# Patient Record
Sex: Female | Born: 1945 | Race: White | Hispanic: No | Marital: Married | Smoking: Former smoker
Health system: Southern US, Community
[De-identification: ages and names within clinical notes are randomized; demographics above are authoritative.]

## PROBLEM LIST (undated history)

## (undated) DIAGNOSIS — R21 Rash and other nonspecific skin eruption: Secondary | ICD-10-CM

## (undated) DIAGNOSIS — R42 Dizziness and giddiness: Secondary | ICD-10-CM

## (undated) DIAGNOSIS — Z950 Presence of cardiac pacemaker: Secondary | ICD-10-CM

## (undated) DIAGNOSIS — R498 Other voice and resonance disorders: Secondary | ICD-10-CM

## (undated) DIAGNOSIS — M1990 Unspecified osteoarthritis, unspecified site: Secondary | ICD-10-CM

## (undated) DIAGNOSIS — M533 Sacrococcygeal disorders, not elsewhere classified: Secondary | ICD-10-CM

## (undated) DIAGNOSIS — F411 Generalized anxiety disorder: Secondary | ICD-10-CM

## (undated) DIAGNOSIS — K219 Gastro-esophageal reflux disease without esophagitis: Secondary | ICD-10-CM

## (undated) DIAGNOSIS — I6350 Cerebral infarction due to unspecified occlusion or stenosis of unspecified cerebral artery: Secondary | ICD-10-CM

## (undated) DIAGNOSIS — I447 Left bundle-branch block, unspecified: Secondary | ICD-10-CM

## (undated) DIAGNOSIS — I4891 Unspecified atrial fibrillation: Secondary | ICD-10-CM

## (undated) DIAGNOSIS — Z8719 Personal history of other diseases of the digestive system: Secondary | ICD-10-CM

## (undated) DIAGNOSIS — R51 Headache: Secondary | ICD-10-CM

## (undated) DIAGNOSIS — J309 Allergic rhinitis, unspecified: Secondary | ICD-10-CM

## (undated) DIAGNOSIS — I00 Rheumatic fever without heart involvement: Secondary | ICD-10-CM

## (undated) DIAGNOSIS — I498 Other specified cardiac arrhythmias: Secondary | ICD-10-CM

## (undated) DIAGNOSIS — M25569 Pain in unspecified knee: Secondary | ICD-10-CM

## (undated) DIAGNOSIS — R011 Cardiac murmur, unspecified: Secondary | ICD-10-CM

## (undated) DIAGNOSIS — Z96659 Presence of unspecified artificial knee joint: Secondary | ICD-10-CM

## (undated) DIAGNOSIS — F329 Major depressive disorder, single episode, unspecified: Secondary | ICD-10-CM

## (undated) DIAGNOSIS — I2699 Other pulmonary embolism without acute cor pulmonale: Secondary | ICD-10-CM

## (undated) DIAGNOSIS — F3289 Other specified depressive episodes: Secondary | ICD-10-CM

## (undated) DIAGNOSIS — I495 Sick sinus syndrome: Secondary | ICD-10-CM

## (undated) DIAGNOSIS — I10 Essential (primary) hypertension: Secondary | ICD-10-CM

## (undated) DIAGNOSIS — IMO0001 Reserved for inherently not codable concepts without codable children: Secondary | ICD-10-CM

## (undated) DIAGNOSIS — J45909 Unspecified asthma, uncomplicated: Secondary | ICD-10-CM

## (undated) DIAGNOSIS — E119 Type 2 diabetes mellitus without complications: Secondary | ICD-10-CM

## (undated) DIAGNOSIS — J40 Bronchitis, not specified as acute or chronic: Secondary | ICD-10-CM

## (undated) DIAGNOSIS — G4733 Obstructive sleep apnea (adult) (pediatric): Secondary | ICD-10-CM

## (undated) HISTORY — DX: Headache: R51

## (undated) HISTORY — DX: Obstructive sleep apnea (adult) (pediatric): G47.33

## (undated) HISTORY — PX: CARDIAC CATHETERIZATION: SHX172

## (undated) HISTORY — DX: Sick sinus syndrome: I49.5

## (undated) HISTORY — PX: ELBOW SURGERY: SHX618

## (undated) HISTORY — PX: ABDOMINAL HYSTERECTOMY: SHX81

## (undated) HISTORY — DX: Other specified depressive episodes: F32.89

## (undated) HISTORY — DX: Other voice and resonance disorders: R49.8

## (undated) HISTORY — DX: Bronchitis, not specified as acute or chronic: J40

## (undated) HISTORY — DX: Pain in unspecified knee: M25.569

## (undated) HISTORY — PX: INSERT / REPLACE / REMOVE PACEMAKER: SUR710

## (undated) HISTORY — PX: NASAL FRACTURE SURGERY: SHX718

## (undated) HISTORY — DX: Other specified cardiac arrhythmias: I49.8

## (undated) HISTORY — DX: Sacrococcygeal disorders, not elsewhere classified: M53.3

## (undated) HISTORY — DX: Reserved for inherently not codable concepts without codable children: IMO0001

## (undated) HISTORY — DX: Essential (primary) hypertension: I10

## (undated) HISTORY — DX: Cerebral infarction due to unspecified occlusion or stenosis of unspecified cerebral artery: I63.50

## (undated) HISTORY — DX: Rash and other nonspecific skin eruption: R21

## (undated) HISTORY — DX: Left bundle-branch block, unspecified: I44.7

## (undated) HISTORY — PX: CHOLECYSTECTOMY: SHX55

## (undated) HISTORY — DX: Allergic rhinitis, unspecified: J30.9

## (undated) HISTORY — DX: Unspecified asthma, uncomplicated: J45.909

## (undated) HISTORY — DX: Generalized anxiety disorder: F41.1

## (undated) HISTORY — DX: Major depressive disorder, single episode, unspecified: F32.9

## (undated) HISTORY — DX: Unspecified atrial fibrillation: I48.91

## (undated) HISTORY — DX: Presence of cardiac pacemaker: Z95.0

## (undated) HISTORY — DX: Other pulmonary embolism without acute cor pulmonale: I26.99

## (undated) HISTORY — PX: TOTAL KNEE ARTHROPLASTY: SHX125

## (undated) HISTORY — DX: Type 2 diabetes mellitus without complications: E11.9

---

## 1972-03-07 HISTORY — PX: VESICOVAGINAL FISTULA CLOSURE W/ TAH: SUR271

## 1980-03-07 HISTORY — PX: TEMPOROMANDIBULAR JOINT ARTHROPLASTY: SUR76

## 1989-03-07 HISTORY — PX: BILATERAL SALPINGOOPHORECTOMY: SHX1223

## 1997-09-03 ENCOUNTER — Ambulatory Visit: Admission: RE | Admit: 1997-09-03 | Discharge: 1997-09-03 | Payer: Self-pay | Admitting: Gynecologic Oncology

## 1997-09-12 ENCOUNTER — Ambulatory Visit (HOSPITAL_COMMUNITY): Admission: RE | Admit: 1997-09-12 | Discharge: 1997-09-12 | Payer: Self-pay | Admitting: Urology

## 1997-11-18 ENCOUNTER — Ambulatory Visit: Admission: RE | Admit: 1997-11-18 | Discharge: 1997-11-18 | Payer: Self-pay | Admitting: Gynecologic Oncology

## 1998-04-30 ENCOUNTER — Encounter: Payer: Self-pay | Admitting: Urology

## 1998-05-01 ENCOUNTER — Ambulatory Visit (HOSPITAL_COMMUNITY): Admission: RE | Admit: 1998-05-01 | Discharge: 1998-05-01 | Payer: Self-pay | Admitting: Urology

## 1998-05-08 ENCOUNTER — Encounter: Admission: RE | Admit: 1998-05-08 | Discharge: 1998-08-06 | Payer: Self-pay | Admitting: Neurology

## 1998-05-22 ENCOUNTER — Ambulatory Visit (HOSPITAL_COMMUNITY): Admission: RE | Admit: 1998-05-22 | Discharge: 1998-05-22 | Payer: Self-pay | Admitting: Gastroenterology

## 1999-01-26 ENCOUNTER — Ambulatory Visit (HOSPITAL_COMMUNITY): Admission: RE | Admit: 1999-01-26 | Discharge: 1999-01-26 | Payer: Self-pay | Admitting: Urology

## 1999-02-13 ENCOUNTER — Ambulatory Visit (HOSPITAL_COMMUNITY): Admission: RE | Admit: 1999-02-13 | Discharge: 1999-02-13 | Payer: Self-pay

## 1999-04-22 ENCOUNTER — Encounter: Payer: Self-pay | Admitting: Family Medicine

## 1999-04-22 ENCOUNTER — Encounter: Admission: RE | Admit: 1999-04-22 | Discharge: 1999-04-22 | Payer: Self-pay | Admitting: Family Medicine

## 1999-05-24 ENCOUNTER — Emergency Department (HOSPITAL_COMMUNITY): Admission: EM | Admit: 1999-05-24 | Discharge: 1999-05-24 | Payer: Self-pay | Admitting: Emergency Medicine

## 1999-05-28 ENCOUNTER — Inpatient Hospital Stay (HOSPITAL_COMMUNITY): Admission: AD | Admit: 1999-05-28 | Discharge: 1999-06-01 | Payer: Self-pay | Admitting: Neurology

## 1999-05-28 ENCOUNTER — Ambulatory Visit (HOSPITAL_COMMUNITY): Admission: RE | Admit: 1999-05-28 | Discharge: 1999-05-28 | Payer: Self-pay | Admitting: Neurology

## 1999-05-28 ENCOUNTER — Encounter: Payer: Self-pay | Admitting: Neurology

## 1999-05-29 ENCOUNTER — Encounter: Payer: Self-pay | Admitting: Neurology

## 1999-05-30 ENCOUNTER — Encounter: Payer: Self-pay | Admitting: Neurology

## 1999-06-23 ENCOUNTER — Ambulatory Visit (HOSPITAL_COMMUNITY): Admission: RE | Admit: 1999-06-23 | Discharge: 1999-06-23 | Payer: Self-pay | Admitting: Neurology

## 1999-06-24 ENCOUNTER — Encounter: Payer: Self-pay | Admitting: Neurology

## 1999-08-27 ENCOUNTER — Encounter: Payer: Self-pay | Admitting: Neurology

## 1999-08-27 ENCOUNTER — Ambulatory Visit (HOSPITAL_COMMUNITY): Admission: RE | Admit: 1999-08-27 | Discharge: 1999-08-27 | Payer: Self-pay | Admitting: Neurology

## 1999-11-12 ENCOUNTER — Ambulatory Visit (HOSPITAL_COMMUNITY): Admission: RE | Admit: 1999-11-12 | Discharge: 1999-11-12 | Payer: Self-pay | Admitting: Neurology

## 1999-11-12 ENCOUNTER — Encounter: Payer: Self-pay | Admitting: Neurology

## 1999-12-09 ENCOUNTER — Encounter: Payer: Self-pay | Admitting: Urology

## 1999-12-10 ENCOUNTER — Ambulatory Visit (HOSPITAL_COMMUNITY): Admission: RE | Admit: 1999-12-10 | Discharge: 1999-12-10 | Payer: Self-pay | Admitting: Urology

## 1999-12-16 ENCOUNTER — Ambulatory Visit (HOSPITAL_COMMUNITY): Admission: RE | Admit: 1999-12-16 | Discharge: 1999-12-16 | Payer: Self-pay | Admitting: Gastroenterology

## 2000-01-05 ENCOUNTER — Encounter: Admission: RE | Admit: 2000-01-05 | Discharge: 2000-04-04 | Payer: Self-pay | Admitting: Family Medicine

## 2000-02-03 ENCOUNTER — Encounter: Admission: RE | Admit: 2000-02-03 | Discharge: 2000-02-03 | Payer: Self-pay | Admitting: Gastroenterology

## 2000-02-03 ENCOUNTER — Encounter: Payer: Self-pay | Admitting: Gastroenterology

## 2000-03-21 ENCOUNTER — Encounter: Admission: RE | Admit: 2000-03-21 | Discharge: 2000-03-21 | Payer: Self-pay | Admitting: Gastroenterology

## 2000-03-21 ENCOUNTER — Encounter: Payer: Self-pay | Admitting: Gastroenterology

## 2000-04-23 ENCOUNTER — Emergency Department (HOSPITAL_COMMUNITY): Admission: EM | Admit: 2000-04-23 | Discharge: 2000-04-23 | Payer: Self-pay | Admitting: Emergency Medicine

## 2000-04-24 ENCOUNTER — Encounter: Payer: Self-pay | Admitting: Emergency Medicine

## 2000-05-05 ENCOUNTER — Ambulatory Visit (HOSPITAL_COMMUNITY): Admission: RE | Admit: 2000-05-05 | Discharge: 2000-05-05 | Payer: Self-pay | Admitting: Urology

## 2000-05-09 ENCOUNTER — Encounter: Admission: RE | Admit: 2000-05-09 | Discharge: 2000-05-09 | Payer: Self-pay | Admitting: Family Medicine

## 2000-05-09 ENCOUNTER — Encounter: Payer: Self-pay | Admitting: Family Medicine

## 2000-05-14 ENCOUNTER — Ambulatory Visit (HOSPITAL_COMMUNITY): Admission: RE | Admit: 2000-05-14 | Discharge: 2000-05-14 | Payer: Self-pay

## 2000-06-19 ENCOUNTER — Ambulatory Visit (HOSPITAL_BASED_OUTPATIENT_CLINIC_OR_DEPARTMENT_OTHER): Admission: RE | Admit: 2000-06-19 | Discharge: 2000-06-19 | Payer: Self-pay | Admitting: *Deleted

## 2000-07-18 ENCOUNTER — Encounter: Admission: RE | Admit: 2000-07-18 | Discharge: 2000-07-18 | Payer: Self-pay | Admitting: Gastroenterology

## 2000-07-18 ENCOUNTER — Encounter: Payer: Self-pay | Admitting: Gastroenterology

## 2000-08-28 ENCOUNTER — Encounter: Admission: RE | Admit: 2000-08-28 | Discharge: 2000-09-22 | Payer: Self-pay | Admitting: Orthopaedic Surgery

## 2000-09-21 ENCOUNTER — Other Ambulatory Visit: Admission: RE | Admit: 2000-09-21 | Discharge: 2000-09-21 | Payer: Self-pay | Admitting: Obstetrics and Gynecology

## 2000-10-26 ENCOUNTER — Encounter: Admission: RE | Admit: 2000-10-26 | Discharge: 2000-11-15 | Payer: Self-pay | Admitting: Orthopaedic Surgery

## 2000-10-26 ENCOUNTER — Encounter (INDEPENDENT_AMBULATORY_CARE_PROVIDER_SITE_OTHER): Payer: Self-pay

## 2000-10-30 ENCOUNTER — Ambulatory Visit (HOSPITAL_COMMUNITY): Admission: RE | Admit: 2000-10-30 | Discharge: 2000-10-30 | Payer: Self-pay | Admitting: General Surgery

## 2000-10-30 ENCOUNTER — Encounter (HOSPITAL_BASED_OUTPATIENT_CLINIC_OR_DEPARTMENT_OTHER): Payer: Self-pay | Admitting: General Surgery

## 2000-11-24 ENCOUNTER — Encounter (HOSPITAL_BASED_OUTPATIENT_CLINIC_OR_DEPARTMENT_OTHER): Payer: Self-pay | Admitting: General Surgery

## 2000-11-29 ENCOUNTER — Inpatient Hospital Stay (HOSPITAL_COMMUNITY): Admission: RE | Admit: 2000-11-29 | Discharge: 2000-12-04 | Payer: Self-pay | Admitting: General Surgery

## 2001-04-03 ENCOUNTER — Ambulatory Visit (HOSPITAL_BASED_OUTPATIENT_CLINIC_OR_DEPARTMENT_OTHER): Admission: RE | Admit: 2001-04-03 | Discharge: 2001-04-03 | Payer: Self-pay | Admitting: Neurology

## 2001-05-14 ENCOUNTER — Encounter: Admission: RE | Admit: 2001-05-14 | Discharge: 2001-05-14 | Payer: Self-pay | Admitting: Family Medicine

## 2001-05-14 ENCOUNTER — Encounter: Payer: Self-pay | Admitting: Family Medicine

## 2001-05-31 ENCOUNTER — Encounter: Payer: Self-pay | Admitting: Internal Medicine

## 2001-05-31 ENCOUNTER — Ambulatory Visit (HOSPITAL_COMMUNITY): Admission: RE | Admit: 2001-05-31 | Discharge: 2001-05-31 | Payer: Self-pay | Admitting: Internal Medicine

## 2001-09-10 ENCOUNTER — Encounter: Payer: Self-pay | Admitting: Urology

## 2001-09-10 ENCOUNTER — Encounter: Admission: RE | Admit: 2001-09-10 | Discharge: 2001-09-10 | Payer: Self-pay | Admitting: Urology

## 2001-11-15 ENCOUNTER — Encounter: Payer: Self-pay | Admitting: Gastroenterology

## 2001-11-15 ENCOUNTER — Encounter: Admission: RE | Admit: 2001-11-15 | Discharge: 2001-11-15 | Payer: Self-pay | Admitting: Gastroenterology

## 2001-12-06 ENCOUNTER — Ambulatory Visit (HOSPITAL_COMMUNITY): Admission: RE | Admit: 2001-12-06 | Discharge: 2001-12-06 | Payer: Self-pay | Admitting: Gastroenterology

## 2001-12-28 ENCOUNTER — Encounter: Admission: RE | Admit: 2001-12-28 | Discharge: 2001-12-28 | Payer: Self-pay | Admitting: Urology

## 2001-12-28 ENCOUNTER — Encounter: Payer: Self-pay | Admitting: Urology

## 2002-01-01 ENCOUNTER — Ambulatory Visit (HOSPITAL_BASED_OUTPATIENT_CLINIC_OR_DEPARTMENT_OTHER): Admission: RE | Admit: 2002-01-01 | Discharge: 2002-01-01 | Payer: Self-pay | Admitting: Urology

## 2002-03-13 ENCOUNTER — Ambulatory Visit (HOSPITAL_COMMUNITY): Admission: RE | Admit: 2002-03-13 | Discharge: 2002-03-13 | Payer: Self-pay | Admitting: Neurology

## 2002-03-13 ENCOUNTER — Encounter: Payer: Self-pay | Admitting: Neurology

## 2002-03-19 ENCOUNTER — Ambulatory Visit (HOSPITAL_COMMUNITY): Admission: RE | Admit: 2002-03-19 | Discharge: 2002-03-20 | Payer: Self-pay | Admitting: Neurology

## 2002-06-12 ENCOUNTER — Encounter: Payer: Self-pay | Admitting: Family Medicine

## 2002-06-12 ENCOUNTER — Encounter: Admission: RE | Admit: 2002-06-12 | Discharge: 2002-06-12 | Payer: Self-pay | Admitting: Family Medicine

## 2002-07-11 ENCOUNTER — Encounter: Payer: Self-pay | Admitting: Emergency Medicine

## 2002-07-11 ENCOUNTER — Emergency Department (HOSPITAL_COMMUNITY): Admission: EM | Admit: 2002-07-11 | Discharge: 2002-07-11 | Payer: Self-pay | Admitting: Emergency Medicine

## 2002-08-13 ENCOUNTER — Encounter: Admission: RE | Admit: 2002-08-13 | Discharge: 2002-08-20 | Payer: Self-pay

## 2002-08-30 ENCOUNTER — Ambulatory Visit (HOSPITAL_BASED_OUTPATIENT_CLINIC_OR_DEPARTMENT_OTHER): Admission: RE | Admit: 2002-08-30 | Discharge: 2002-08-30 | Payer: Self-pay | Admitting: Urology

## 2003-03-20 ENCOUNTER — Ambulatory Visit (HOSPITAL_COMMUNITY): Admission: RE | Admit: 2003-03-20 | Discharge: 2003-03-20 | Payer: Self-pay | Admitting: Internal Medicine

## 2003-03-25 ENCOUNTER — Inpatient Hospital Stay (HOSPITAL_BASED_OUTPATIENT_CLINIC_OR_DEPARTMENT_OTHER): Admission: RE | Admit: 2003-03-25 | Discharge: 2003-03-25 | Payer: Self-pay | Admitting: Interventional Cardiology

## 2003-06-24 ENCOUNTER — Encounter: Admission: RE | Admit: 2003-06-24 | Discharge: 2003-06-24 | Payer: Self-pay | Admitting: Family Medicine

## 2003-06-24 ENCOUNTER — Encounter: Admission: RE | Admit: 2003-06-24 | Discharge: 2003-06-24 | Payer: Self-pay | Admitting: Otolaryngology

## 2003-08-22 ENCOUNTER — Ambulatory Visit (HOSPITAL_COMMUNITY): Admission: RE | Admit: 2003-08-22 | Discharge: 2003-08-22 | Payer: Self-pay | Admitting: Urology

## 2003-08-22 ENCOUNTER — Ambulatory Visit (HOSPITAL_BASED_OUTPATIENT_CLINIC_OR_DEPARTMENT_OTHER): Admission: RE | Admit: 2003-08-22 | Discharge: 2003-08-22 | Payer: Self-pay | Admitting: Urology

## 2004-04-01 ENCOUNTER — Ambulatory Visit: Payer: Self-pay | Admitting: Internal Medicine

## 2004-04-08 ENCOUNTER — Ambulatory Visit: Payer: Self-pay | Admitting: Internal Medicine

## 2004-04-08 ENCOUNTER — Ambulatory Visit (HOSPITAL_BASED_OUTPATIENT_CLINIC_OR_DEPARTMENT_OTHER): Admission: RE | Admit: 2004-04-08 | Discharge: 2004-04-08 | Payer: Self-pay | Admitting: Internal Medicine

## 2004-04-21 ENCOUNTER — Ambulatory Visit: Payer: Self-pay | Admitting: Internal Medicine

## 2004-04-22 ENCOUNTER — Ambulatory Visit: Payer: Self-pay | Admitting: Internal Medicine

## 2004-04-23 ENCOUNTER — Ambulatory Visit: Payer: Self-pay | Admitting: Internal Medicine

## 2004-05-18 ENCOUNTER — Ambulatory Visit: Payer: Self-pay | Admitting: Internal Medicine

## 2004-06-01 ENCOUNTER — Ambulatory Visit (HOSPITAL_COMMUNITY): Admission: RE | Admit: 2004-06-01 | Discharge: 2004-06-01 | Payer: Self-pay | Admitting: Urology

## 2004-06-01 ENCOUNTER — Ambulatory Visit (HOSPITAL_BASED_OUTPATIENT_CLINIC_OR_DEPARTMENT_OTHER): Admission: RE | Admit: 2004-06-01 | Discharge: 2004-06-01 | Payer: Self-pay | Admitting: Urology

## 2004-06-11 ENCOUNTER — Ambulatory Visit: Payer: Self-pay | Admitting: Internal Medicine

## 2004-06-16 ENCOUNTER — Ambulatory Visit: Payer: Self-pay | Admitting: Internal Medicine

## 2004-06-30 ENCOUNTER — Ambulatory Visit: Payer: Self-pay | Admitting: Internal Medicine

## 2004-07-15 ENCOUNTER — Encounter: Admission: RE | Admit: 2004-07-15 | Discharge: 2004-07-15 | Payer: Self-pay | Admitting: Gastroenterology

## 2004-07-20 ENCOUNTER — Emergency Department (HOSPITAL_COMMUNITY): Admission: EM | Admit: 2004-07-20 | Discharge: 2004-07-20 | Payer: Self-pay | Admitting: Emergency Medicine

## 2004-07-30 ENCOUNTER — Encounter: Admission: RE | Admit: 2004-07-30 | Discharge: 2004-07-30 | Payer: Self-pay | Admitting: Gastroenterology

## 2004-08-16 ENCOUNTER — Ambulatory Visit: Payer: Self-pay | Admitting: Internal Medicine

## 2004-09-01 ENCOUNTER — Encounter: Admission: RE | Admit: 2004-09-01 | Discharge: 2004-09-01 | Payer: Self-pay | Admitting: Family Medicine

## 2004-09-09 ENCOUNTER — Encounter: Admission: RE | Admit: 2004-09-09 | Discharge: 2004-09-09 | Payer: Self-pay | Admitting: Family Medicine

## 2004-09-21 ENCOUNTER — Ambulatory Visit: Payer: Self-pay | Admitting: Internal Medicine

## 2004-11-23 ENCOUNTER — Ambulatory Visit: Payer: Self-pay | Admitting: Internal Medicine

## 2005-01-11 ENCOUNTER — Ambulatory Visit: Payer: Self-pay | Admitting: Internal Medicine

## 2005-01-21 ENCOUNTER — Encounter: Admission: RE | Admit: 2005-01-21 | Discharge: 2005-01-21 | Payer: Self-pay | Admitting: Family Medicine

## 2005-02-01 ENCOUNTER — Ambulatory Visit (HOSPITAL_BASED_OUTPATIENT_CLINIC_OR_DEPARTMENT_OTHER): Admission: RE | Admit: 2005-02-01 | Discharge: 2005-02-01 | Payer: Self-pay | Admitting: Urology

## 2005-02-01 ENCOUNTER — Ambulatory Visit (HOSPITAL_COMMUNITY): Admission: RE | Admit: 2005-02-01 | Discharge: 2005-02-01 | Payer: Self-pay | Admitting: Urology

## 2005-03-03 ENCOUNTER — Ambulatory Visit: Payer: Self-pay | Admitting: Internal Medicine

## 2005-05-17 ENCOUNTER — Ambulatory Visit (HOSPITAL_BASED_OUTPATIENT_CLINIC_OR_DEPARTMENT_OTHER): Admission: RE | Admit: 2005-05-17 | Discharge: 2005-05-17 | Payer: Self-pay | Admitting: Internal Medicine

## 2005-05-29 ENCOUNTER — Ambulatory Visit: Payer: Self-pay | Admitting: Internal Medicine

## 2005-05-31 ENCOUNTER — Ambulatory Visit: Payer: Self-pay | Admitting: Internal Medicine

## 2005-06-14 ENCOUNTER — Ambulatory Visit: Payer: Self-pay | Admitting: Internal Medicine

## 2005-09-06 ENCOUNTER — Encounter: Admission: RE | Admit: 2005-09-06 | Discharge: 2005-09-06 | Payer: Self-pay | Admitting: Family Medicine

## 2005-09-20 ENCOUNTER — Ambulatory Visit: Payer: Self-pay | Admitting: Internal Medicine

## 2005-09-29 ENCOUNTER — Ambulatory Visit: Payer: Self-pay | Admitting: Internal Medicine

## 2005-12-06 ENCOUNTER — Ambulatory Visit (HOSPITAL_BASED_OUTPATIENT_CLINIC_OR_DEPARTMENT_OTHER): Admission: RE | Admit: 2005-12-06 | Discharge: 2005-12-06 | Payer: Self-pay | Admitting: Urology

## 2006-01-05 ENCOUNTER — Ambulatory Visit: Payer: Self-pay | Admitting: Internal Medicine

## 2006-01-30 ENCOUNTER — Ambulatory Visit: Payer: Self-pay | Admitting: Internal Medicine

## 2006-04-25 ENCOUNTER — Ambulatory Visit: Payer: Self-pay | Admitting: Internal Medicine

## 2006-05-02 ENCOUNTER — Ambulatory Visit: Payer: Self-pay | Admitting: Internal Medicine

## 2006-05-22 ENCOUNTER — Ambulatory Visit: Payer: Self-pay | Admitting: Internal Medicine

## 2006-05-24 ENCOUNTER — Encounter: Admission: RE | Admit: 2006-05-24 | Discharge: 2006-05-24 | Payer: Self-pay | Admitting: Orthopedic Surgery

## 2006-06-16 ENCOUNTER — Encounter: Admission: RE | Admit: 2006-06-16 | Discharge: 2006-06-16 | Payer: Self-pay | Admitting: Orthopedic Surgery

## 2006-06-30 ENCOUNTER — Encounter: Admission: RE | Admit: 2006-06-30 | Discharge: 2006-06-30 | Payer: Self-pay | Admitting: Orthopedic Surgery

## 2006-07-17 ENCOUNTER — Ambulatory Visit: Payer: Self-pay | Admitting: Internal Medicine

## 2006-07-20 ENCOUNTER — Ambulatory Visit: Payer: Self-pay | Admitting: Internal Medicine

## 2006-08-09 ENCOUNTER — Ambulatory Visit: Payer: Self-pay | Admitting: Internal Medicine

## 2006-08-31 ENCOUNTER — Ambulatory Visit (HOSPITAL_COMMUNITY): Admission: RE | Admit: 2006-08-31 | Discharge: 2006-08-31 | Payer: Self-pay | Admitting: Neurological Surgery

## 2006-09-11 ENCOUNTER — Ambulatory Visit: Payer: Self-pay | Admitting: Internal Medicine

## 2006-10-04 ENCOUNTER — Encounter: Admission: RE | Admit: 2006-10-04 | Discharge: 2006-10-04 | Payer: Self-pay | Admitting: Family Medicine

## 2006-10-22 ENCOUNTER — Observation Stay (HOSPITAL_COMMUNITY): Admission: EM | Admit: 2006-10-22 | Discharge: 2006-10-23 | Payer: Self-pay | Admitting: Emergency Medicine

## 2006-12-12 ENCOUNTER — Ambulatory Visit: Payer: Self-pay | Admitting: Internal Medicine

## 2006-12-14 ENCOUNTER — Ambulatory Visit (HOSPITAL_BASED_OUTPATIENT_CLINIC_OR_DEPARTMENT_OTHER): Admission: RE | Admit: 2006-12-14 | Discharge: 2006-12-14 | Payer: Self-pay | Admitting: Orthopedic Surgery

## 2007-01-15 ENCOUNTER — Ambulatory Visit: Payer: Self-pay | Admitting: Internal Medicine

## 2007-01-17 ENCOUNTER — Inpatient Hospital Stay (HOSPITAL_COMMUNITY): Admission: EM | Admit: 2007-01-17 | Discharge: 2007-01-19 | Payer: Self-pay | Admitting: Emergency Medicine

## 2007-01-17 ENCOUNTER — Ambulatory Visit: Payer: Self-pay | Admitting: Cardiology

## 2007-02-12 ENCOUNTER — Telehealth (INDEPENDENT_AMBULATORY_CARE_PROVIDER_SITE_OTHER): Payer: Self-pay | Admitting: *Deleted

## 2007-02-27 ENCOUNTER — Ambulatory Visit (HOSPITAL_COMMUNITY): Admission: RE | Admit: 2007-02-27 | Discharge: 2007-02-27 | Payer: Self-pay | Admitting: Urology

## 2007-04-05 ENCOUNTER — Ambulatory Visit (HOSPITAL_BASED_OUTPATIENT_CLINIC_OR_DEPARTMENT_OTHER): Admission: RE | Admit: 2007-04-05 | Discharge: 2007-04-05 | Payer: Self-pay | Admitting: Orthopedic Surgery

## 2007-04-06 ENCOUNTER — Ambulatory Visit: Payer: Self-pay | Admitting: Internal Medicine

## 2007-05-16 DIAGNOSIS — F411 Generalized anxiety disorder: Secondary | ICD-10-CM | POA: Insufficient documentation

## 2007-05-16 DIAGNOSIS — R519 Headache, unspecified: Secondary | ICD-10-CM | POA: Insufficient documentation

## 2007-05-16 DIAGNOSIS — R498 Other voice and resonance disorders: Secondary | ICD-10-CM

## 2007-05-16 DIAGNOSIS — Z8673 Personal history of transient ischemic attack (TIA), and cerebral infarction without residual deficits: Secondary | ICD-10-CM | POA: Insufficient documentation

## 2007-05-16 DIAGNOSIS — R51 Headache: Secondary | ICD-10-CM | POA: Insufficient documentation

## 2007-05-16 DIAGNOSIS — E119 Type 2 diabetes mellitus without complications: Secondary | ICD-10-CM | POA: Insufficient documentation

## 2007-05-16 DIAGNOSIS — J40 Bronchitis, not specified as acute or chronic: Secondary | ICD-10-CM | POA: Insufficient documentation

## 2007-05-16 DIAGNOSIS — IMO0001 Reserved for inherently not codable concepts without codable children: Secondary | ICD-10-CM

## 2007-05-16 DIAGNOSIS — G4733 Obstructive sleep apnea (adult) (pediatric): Secondary | ICD-10-CM | POA: Insufficient documentation

## 2007-05-16 DIAGNOSIS — R1319 Other dysphagia: Secondary | ICD-10-CM | POA: Insufficient documentation

## 2007-05-22 ENCOUNTER — Encounter: Payer: Self-pay | Admitting: Internal Medicine

## 2007-06-06 ENCOUNTER — Ambulatory Visit: Payer: Self-pay | Admitting: Internal Medicine

## 2007-06-06 DIAGNOSIS — Z950 Presence of cardiac pacemaker: Secondary | ICD-10-CM | POA: Insufficient documentation

## 2007-06-09 DIAGNOSIS — J45998 Other asthma: Secondary | ICD-10-CM

## 2007-06-21 ENCOUNTER — Encounter: Admission: RE | Admit: 2007-06-21 | Discharge: 2007-06-21 | Payer: Self-pay | Admitting: Orthopedic Surgery

## 2007-07-04 ENCOUNTER — Encounter: Admission: RE | Admit: 2007-07-04 | Discharge: 2007-07-04 | Payer: Self-pay | Admitting: Orthopedic Surgery

## 2007-07-26 ENCOUNTER — Ambulatory Visit: Payer: Self-pay | Admitting: Internal Medicine

## 2007-07-31 ENCOUNTER — Encounter: Admission: RE | Admit: 2007-07-31 | Discharge: 2007-07-31 | Payer: Self-pay | Admitting: Orthopedic Surgery

## 2007-08-08 ENCOUNTER — Ambulatory Visit (HOSPITAL_COMMUNITY): Admission: RE | Admit: 2007-08-08 | Discharge: 2007-08-08 | Payer: Self-pay | Admitting: Neurological Surgery

## 2007-08-10 ENCOUNTER — Encounter: Payer: Self-pay | Admitting: Sports Medicine

## 2007-08-28 ENCOUNTER — Ambulatory Visit: Payer: Self-pay | Admitting: Sports Medicine

## 2007-08-28 ENCOUNTER — Encounter: Admission: RE | Admit: 2007-08-28 | Discharge: 2007-08-28 | Payer: Self-pay | Admitting: Sports Medicine

## 2007-08-28 DIAGNOSIS — M2550 Pain in unspecified joint: Secondary | ICD-10-CM

## 2007-08-28 DIAGNOSIS — M25569 Pain in unspecified knee: Secondary | ICD-10-CM

## 2007-08-28 DIAGNOSIS — F329 Major depressive disorder, single episode, unspecified: Secondary | ICD-10-CM

## 2007-09-03 ENCOUNTER — Telehealth: Payer: Self-pay | Admitting: Sports Medicine

## 2007-09-28 ENCOUNTER — Ambulatory Visit: Payer: Self-pay | Admitting: Sports Medicine

## 2007-09-28 DIAGNOSIS — M533 Sacrococcygeal disorders, not elsewhere classified: Secondary | ICD-10-CM | POA: Insufficient documentation

## 2007-10-29 ENCOUNTER — Encounter: Admission: RE | Admit: 2007-10-29 | Discharge: 2007-10-29 | Payer: Self-pay | Admitting: Family Medicine

## 2007-10-30 ENCOUNTER — Ambulatory Visit: Payer: Self-pay | Admitting: Sports Medicine

## 2007-10-30 ENCOUNTER — Encounter: Payer: Self-pay | Admitting: Family Medicine

## 2007-11-06 ENCOUNTER — Encounter: Payer: Self-pay | Admitting: Sports Medicine

## 2007-11-15 ENCOUNTER — Ambulatory Visit: Payer: Self-pay | Admitting: Internal Medicine

## 2007-11-27 ENCOUNTER — Encounter: Admission: RE | Admit: 2007-11-27 | Discharge: 2007-11-27 | Payer: Self-pay | Admitting: Orthopedic Surgery

## 2007-12-20 ENCOUNTER — Ambulatory Visit: Payer: Self-pay | Admitting: Internal Medicine

## 2007-12-20 DIAGNOSIS — J309 Allergic rhinitis, unspecified: Secondary | ICD-10-CM | POA: Insufficient documentation

## 2007-12-21 ENCOUNTER — Telehealth (INDEPENDENT_AMBULATORY_CARE_PROVIDER_SITE_OTHER): Payer: Self-pay | Admitting: *Deleted

## 2008-02-08 ENCOUNTER — Encounter: Payer: Self-pay | Admitting: Internal Medicine

## 2008-03-06 ENCOUNTER — Ambulatory Visit: Payer: Self-pay | Admitting: Internal Medicine

## 2008-03-07 HISTORY — PX: OTHER SURGICAL HISTORY: SHX169

## 2008-04-23 ENCOUNTER — Telehealth: Payer: Self-pay | Admitting: Internal Medicine

## 2008-05-19 ENCOUNTER — Telehealth (INDEPENDENT_AMBULATORY_CARE_PROVIDER_SITE_OTHER): Payer: Self-pay | Admitting: *Deleted

## 2008-05-26 ENCOUNTER — Telehealth (INDEPENDENT_AMBULATORY_CARE_PROVIDER_SITE_OTHER): Payer: Self-pay | Admitting: *Deleted

## 2008-05-26 ENCOUNTER — Ambulatory Visit: Payer: Self-pay | Admitting: Internal Medicine

## 2008-05-30 ENCOUNTER — Telehealth (INDEPENDENT_AMBULATORY_CARE_PROVIDER_SITE_OTHER): Payer: Self-pay | Admitting: *Deleted

## 2008-06-16 ENCOUNTER — Ambulatory Visit: Payer: Self-pay | Admitting: Internal Medicine

## 2008-10-14 ENCOUNTER — Ambulatory Visit: Payer: Self-pay | Admitting: Internal Medicine

## 2008-12-22 ENCOUNTER — Ambulatory Visit: Payer: Self-pay | Admitting: Internal Medicine

## 2008-12-22 DIAGNOSIS — I2699 Other pulmonary embolism without acute cor pulmonale: Secondary | ICD-10-CM | POA: Insufficient documentation

## 2009-01-22 ENCOUNTER — Ambulatory Visit: Payer: Self-pay | Admitting: Internal Medicine

## 2009-02-06 ENCOUNTER — Encounter: Admission: RE | Admit: 2009-02-06 | Discharge: 2009-02-06 | Payer: Self-pay | Admitting: Family Medicine

## 2009-04-29 ENCOUNTER — Ambulatory Visit: Payer: Self-pay | Admitting: Internal Medicine

## 2009-06-11 ENCOUNTER — Encounter: Payer: Self-pay | Admitting: Internal Medicine

## 2009-06-22 ENCOUNTER — Ambulatory Visit: Payer: Self-pay | Admitting: Internal Medicine

## 2009-06-22 ENCOUNTER — Telehealth (INDEPENDENT_AMBULATORY_CARE_PROVIDER_SITE_OTHER): Payer: Self-pay | Admitting: *Deleted

## 2009-08-06 ENCOUNTER — Ambulatory Visit: Payer: Self-pay | Admitting: Internal Medicine

## 2009-08-31 ENCOUNTER — Telehealth (INDEPENDENT_AMBULATORY_CARE_PROVIDER_SITE_OTHER): Payer: Self-pay | Admitting: *Deleted

## 2009-11-24 ENCOUNTER — Ambulatory Visit: Payer: Self-pay | Admitting: Internal Medicine

## 2009-12-21 ENCOUNTER — Ambulatory Visit: Payer: Self-pay | Admitting: Internal Medicine

## 2009-12-23 ENCOUNTER — Encounter: Payer: Self-pay | Admitting: Internal Medicine

## 2010-01-19 ENCOUNTER — Encounter: Admission: RE | Admit: 2010-01-19 | Discharge: 2010-01-19 | Payer: Self-pay | Admitting: Family Medicine

## 2010-02-08 ENCOUNTER — Encounter: Admission: RE | Admit: 2010-02-08 | Discharge: 2010-02-08 | Payer: Self-pay | Admitting: Family Medicine

## 2010-02-16 ENCOUNTER — Encounter
Admission: RE | Admit: 2010-02-16 | Discharge: 2010-02-16 | Payer: Self-pay | Source: Home / Self Care | Attending: Family Medicine | Admitting: Family Medicine

## 2010-03-24 ENCOUNTER — Ambulatory Visit: Payer: Self-pay | Admitting: Internal Medicine

## 2010-03-28 ENCOUNTER — Encounter: Payer: Self-pay | Admitting: Family Medicine

## 2010-03-31 ENCOUNTER — Ambulatory Visit
Admission: RE | Admit: 2010-03-31 | Discharge: 2010-03-31 | Payer: Self-pay | Source: Home / Self Care | Attending: Orthopedic Surgery | Admitting: Orthopedic Surgery

## 2010-03-31 LAB — BASIC METABOLIC PANEL
CO2: 29 mEq/L (ref 19–32)
Calcium: 9.9 mg/dL (ref 8.4–10.5)
Creatinine, Ser: 0.92 mg/dL (ref 0.4–1.2)
Glucose, Bld: 366 mg/dL — ABNORMAL HIGH (ref 70–99)

## 2010-03-31 LAB — GLUCOSE, CAPILLARY: Glucose-Capillary: 114 mg/dL — ABNORMAL HIGH (ref 70–99)

## 2010-04-06 NOTE — Assessment & Plan Note (Signed)
Summary: ROV 6 MONTHS///KP   Primary Provider/Referring Provider:  L. Lupe Carney  CC:  6 month follow up visit-OSA, asthma, and allergies. Unable to use CPAP at this time due to teeth implants and doing work on them..  History of Present Illness: December 22, 2008- Rhinoconjunctivitis, asthma, OSA, hx DVT Had DVT right leg after fx/ knee replacement, with PE, on coumadin now. More repair sgy planned. 1 yr rov - hoarseness in am's- has questoins about Serevent diskus - only uses Symbicort a couple times a yr. We had a long discussion of rescue vs maintenance, and LABA meds.  June 22, 2009- Rhinoconjunctivitis, asthma, OSA, hx DVT Had repair of previous right knee surgery, without respiratory complications. Seasonal rhinitis is worse currently despite daily benadryl and her allergy vaccine.  Can't wear CPAP- gum is tender after recent gingival surgery. This is short term. Grits teeth less without it and can't wear bite guard til gingiva heals. Mentions sore in ear x 3-4 months.  December 21, 2009- Rhinoconjunctivitis, asthma, OSA, hx DVT NurseCC: 6 month follow up visit-OSA, asthma,allergies. Unable to use CPAP at this time due to teeth implants and doing work on them. Wearing bite guard constantly to stop bruxism and mouth biting. Dr Clovis Riley had weaned her down to lower doses of Xanax and that's when the biting started. He has put her back on 3 daily. Not wearing CPAP. Tired during daytime.  Denies wheeze, cough or nasal drainage/ sneeze. Allergy vaccine does well and she says she can tell by the end of the week that she is needing it again as she starts to need antihistamines for nasal congestion  and drainage.  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 Mother has had amputation.    Asthma History    Initial Asthma Severity Rating:    Age range: 12+ years    Symptoms: 0-2 days/week    Nighttime Awakenings: 0-2/month    Interferes w/ normal activity: no limitations   SABA use (not for EIB): 0-2 days/week    Asthma Severity Assessment: Intermittent   Preventive Screening-Counseling & Management  Alcohol-Tobacco     Smoking Status: quit     Year Quit: 2003     Pack years: 40 years  1 pack a month  Current Medications (verified): 1)  Bd Tb Syringe 25g X 5/8" 1 Ml  Misc (Tuberculin-Allergy Syringes) .... Use As Directed. 2)  Tenormin 50 Mg  Tabs (Atenolol) .... Take 1 Tablet By Mouth Once A Day 3)  Lipitor 20 Mg  Tabs (Atorvastatin Calcium) .... Take 1 Tablet By Mouth Once A Day 4)  Allergy Vaccine Go 1:10 .... Twice A Week 5)  Cymbalta 60 Mg  Cpep (Duloxetine Hcl) .... Take 1 Capsule By Mouth Once A Day 6)  Cymbalta 30 Mg Cpep (Duloxetine Hcl) .... Take 1 Capsule By Mouth Once A Day 7)  Glucotrol Xl 10 Mg  Tb24 (Glipizide) .... Take 1 Tablet By Mouth Once A Day 8)  Levemir 100 Unit/ml  Soln (Insulin Detemir) .... 30units in The Morning, 47+ in The Evening 9)  Symbicort 160-4.5 Mcg/act  Aero (Budesonide-Formoterol Fumarate) .... Inhale 2 Puffs Two Times A Day and Rinse Mouth After Each Use 10)  Xanax 1 Mg  Tabs (Alprazolam) .... Take 1 Tablet By Mouth Three Times A Day As Needed 11)  Epipen 0.3 Mg/0.27ml (1:1000)  Devi (Epinephrine Hcl (Anaphylaxis)) .... As Directed 12)  Cpap 11 .... Wear At Bedtime 13)  Trazodone Hcl 150 Mg Tabs (Trazodone  Hcl) .... Take 1 Tab By Mouth At Bedtime 14)  Aspirin 81 Mg Tbec (Aspirin) .... Take 1 By Mouth Once Daily 15)  Zegerid 40-1100 Mg Caps (Omeprazole-Sodium Bicarbonate) .... Take 1 Capsule By Mouth At Bedtime 16)  Miralax  Powd (Polyethylene Glycol 3350) .Marland Kitchen.. 17grams in Water Daily As Needed 17)  Fish Oil 1200 Mg Caps (Omega-3 Fatty Acids) .... 2 Capsules By Mouth Once Daily  Allergies (verified): 1)  ! Keflex 2)  ! Lisinopril 3)  ! Cozaar 4)  ! * Cromic Sutures 5)  ! Doxycycline 6)  ! Metformin Hcl 7)  ! Levaquin 8)  ! Neurontin 9)  ! Entex 10)  ! Zocor 11)  ! * Januvia 12)  ! * Avandia 13)  ! *  Lyrica 14)  ! * Bellaryl 15)  ! 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  married  Risk Factors: Smoking Status: quit (12/21/2009)  Past Surgical History: Right total knee replacement 2002 Repair Right total knee 2010 Hysterectomy 1974 Bilat salpingo-oopherectomy 1991 Cholecystectomy Nasal fracture surg  Review of Systems      See HPI  The patient denies anorexia, fever, weight loss, weight gain, vision loss, decreased hearing, hoarseness, chest pain, syncope, dyspnea on exertion, peripheral edema, prolonged cough, headaches, hemoptysis, abdominal pain, and severe indigestion/heartburn.    Vital Signs:  Patient profile:   65 year old female Height:      63 inches Weight:      145.13 pounds BMI:     25.80 O2 Sat:      97 % on Room air Pulse rate:   65 / minute BP sitting:   124 / 76  (left arm) Cuff size:   regular  Vitals Entered By: Reynaldo Minium CMA (December 21, 2009 10:54 AM)  O2 Flow:  Room air CC: 6 month follow up visit-OSA, asthma,allergies. Unable to use CPAP at this time due to teeth implants and doing work on them.   Physical Exam  Additional Exam:  General: A/Ox3; pleasant and cooperative, NAD, calm,  SKIN: no rash, lesions NODES: no lymphadenopathy HEENT: [place]/AT, EOM- WNL,  Conjuctivae- clear, PERRLA, TM-WNL- good light reflex. Nose- clear, Throat- clear and wnl, chronic nasal speech quality Seems to have some difficulty opening mouth widely.  NECK: Supple w/ fair ROM, JVD- none, normal carotid impulses w/o bruits Thyroid- normal to palpation CHEST: Clear to P&A,  HEART: RRR, no m/g/r heard ABDOMEN: soft ZOX:WRUE, nl pulses, no edema  NEURO: Grossly intact to observation      Impression & Recommendations:  Problem # 1:  DEPRESSION (ICD-311) She seems quite depressed today by her situational isues. I suggested she ask Dr Clovis Riley about meds other than Xanax if he is concerned about habituation. Perhaps tranxene would help sleep quality and her mood.  Problem # 2:  ASTHMA (ICD-493.90) Good control now. We discussed relation of mood to dyspnea and to asthma with issue of vocal cord dysfunction.   Problem # 3:  OBSTRUCTIVE SLEEP APNEA (ICD-327.23)  She is not able to use her CPAP and I don't  know when that will change. She wont be a candidatefor surgery or an oral appliance. She should stay off the flat of her back and keep weight down.  Other Orders: Est. Patient Level IV (19147)  Patient Instructions: 1)  Please schedule a follow-up appointment in 1 year. 2)  Flu vax 3)  Maybe Dr Clovis Riley would have another approach to suggest, rather than the Xanax.  4)  cc Dr Clovis Riley

## 2010-04-06 NOTE — Assessment & Plan Note (Signed)
Summary: 6 months/apc   Primary Provider/Referring Provider:  L. Lupe Carney  CC:  6 month follow up visit.  History of Present Illness: 12/20/07- 1 yr f/u. Continues allergy vaccine at 1:10, discussed, no problems.Got flu vax. Asks antiallergy eye drop- discussed [place].  05/26/08- Rhinoconjunctivitis, asthma, OSA  3 weeks head and chest congestion, dry throat without fever. Finished a zpak from 3/15. Says right side of head and chest now broken up after coughing and blowing yellow green, but left head and chest still feel congested. No longer productive. left ear still aches. Using Neti pot. Using cpap less- afraid of getting choked by postnasal drip.  December 22, 2008- Rhinoconjunctivitis, asthma, OSA, hx DVT Had DVT right leg after fx/ knee replacement, with PE, on coumadin now. More repair sgy planned. 1 yr rov - hoarseness in am's- has questoins about Serevent diskus - only uses Symbicort a couple times a yr. We had a long discussion of rescue vs maintenance, and LABA meds.  June 22, 2009- Rhinoconjunctivitis, asthma, OSA, hx DVT Had repair of previous right knee surgery, without respiratory complications. Seasonal rhinitis is worse currently despite daily benadryl and her allergy vaccine.  Can't wear CPAP- gum is tender after recent gingival surgery. This is short term. Grits teeth less without it and can't wear bite guard til gingiva heals. Mentions sore in ear x 3-4 months.    Current Medications (verified): 1)  Bd Tb Syringe 25g X 5/8" 1 Ml  Misc (Tuberculin-Allergy Syringes) .... Use As Directed. 2)  Tenormin 50 Mg  Tabs (Atenolol) .... Take 1 Tablet By Mouth Once A Day 3)  Lipitor 20 Mg  Tabs (Atorvastatin Calcium) .... Take 1 Tablet By Mouth Once A Day 4)  Multivitamins   Tabs (Multiple Vitamin) .... Take 1 Tablet By Mouth Once A Day 5)  Allergy Vaccine Go 1:10 6)  Cymbalta 60 Mg  Cpep (Duloxetine Hcl) 7)  Glucotrol Xl 10 Mg  Tb24 (Glipizide) 8)  Levemir 100 Unit/ml   Soln (Insulin Detemir) .Marland Kitchen.. 13 Units 9)  Symbicort 160-4.5 Mcg/act  Aero (Budesonide-Formoterol Fumarate) .... Inhale 2 Puffs Two Times A Day and Rinse Mouth After Each Use 10)  Xanax 1 Mg  Tabs (Alprazolam) .... Take One Tablet By Mouth Two  Times A Day As Needed 11)  Epipen 0.3 Mg/0.27ml (1:1000)  Devi (Epinephrine Hcl (Anaphylaxis)) .... As Directed 12)  Vicodin 5-500 Mg  Tabs (Hydrocodone-Acetaminophen) .... Take 1 Tab By Mouth Q6 Hours As Needed For Pain 13)  Cpap .Marland Kitchen.. 11 14)  Trazodone Hcl 100 Mg Tabs (Trazodone Hcl) .... Take 1 By Mouth At Bedtime 15)  Iron 325 (65 Fe) Mg Tabs (Ferrous Sulfate) .... Take One By Mouth Once Daily 16)  Vitamin C 500 Mg Tabs (Ascorbic Acid) .... Take One By Mouth Once Daily 17)  Diphenhydramine Hcl 25 Mg Caps (Diphenhydramine Hcl) .... Take 1 By Mouth Once Daily 18)  Aspirin 81 Mg Tbec (Aspirin) .... Take 1 By Mouth Once Daily  Allergies (verified): 1)  ! Keflex 2)  ! Lisinopril 3)  ! Cozaar 4)  ! * Cromic Sutures 5)  ! Doxycycline  Past History:  Past Medical History: Last updated: 12/22/2008 SACROILIAC JOINT DYSFUNCTION (ICD-724.6) DEPRESSION (ICD-311) DEPRESSIVE DISORDER NOT ELSEWHERE CLASSIFIED (ICD-311) PAIN IN JOINT OTHER SPECIFIED SITES (ICD-719.48) KNEE PAIN, RIGHT (ICD-719.46) ASTHMA (ICD-493.90) CARDIAC PACEMAKER IN SITU (ICD-V45.01) OTHER DYSPHAGIA (ICD-787.29) OBSTRUCTIVE SLEEP APNEA (ICD-327.23) ANXIETY (ICD-300.00) FACIAL PAIN (ICD-784.0) CVA (ICD-434.91) FIBROMYALGIA (ICD-729.1) DIABETES MELLITUS (ICD-250.00) DYSPHONIA (ICD-784.49) BRONCHITIS (ICD-490) RHINOCONJUNCTIVITIS, ALLERGIC (  ICD-477.9) Pulmonary Embolism  Social History: Last updated: 05/26/2008 Patient states former smoker.  married  Risk Factors: Smoking Status: quit (12/20/2007)  Past Surgical History: Right total knee replacement 2002 Repair Right total knee 2011 Hysterectomy 1974 Bilat salpingo-oopherectomy 1991 Cholecystectomy Nasal fracture  surg  Review of Systems      See HPI  The patient denies anorexia, fever, weight loss, weight gain, vision loss, decreased hearing, hoarseness, chest pain, syncope, dyspnea on exertion, peripheral edema, prolonged cough, headaches, hemoptysis, abdominal pain, and severe indigestion/heartburn.    Vital Signs:  Patient profile:   65 year old female Height:      63 inches Weight:      145.13 pounds BMI:     25.80 O2 Sat:      97 % on Room air Pulse rate:   68 / minute BP sitting:   138 / 78  (left arm) Cuff size:   regular  Vitals Entered By: Reynaldo Minium CMA (June 22, 2009 10:14 AM)  O2 Flow:  Room air  Physical Exam  Additional Exam:  General: A/Ox3; pleasant and cooperative, NAD, calm,  SKIN: no rash, lesions NODES: no lymphadenopathy HEENT: [place]/AT, EOM- WNL, Conjuctivae- clear, PERRLA, TM-WNL- good light reflex. There is a minor excoriation in left auricle at 4 o'clock. Nose- clear, Throat- clear and wnl, chronic nasal speech quality NECK: Supple w/ fair ROM, JVD- none, normal carotid impulses w/o bruits Thyroid- normal to palpation CHEST: Clear to P&A,  HEART: RRR, no m/g/r heard ABDOMEN: soft ZOX:WRUE, nl pulses, no edema  NEURO: Grossly intact to observation      Impression & Recommendations:  Problem # 1:  ASTHMA (ICD-493.90) Good control for the season.  Problem # 2:  RHINOCONJUNCTIVITIS, ALLERGIC (ICD-477.9)  Seasonal exacerbation. Will have her try changing the benadryl to allegra. Sample Omnaris with script. For the minor skin lesion in her ear, she can try topical hydrocorticone for a week. She is due to see Dr Clovis Riley soon . Her updated medication list for this problem includes:    Diphenhydramine Hcl 25 Mg Caps (Diphenhydramine hcl) .Marland Kitchen... Take 1 by mouth once daily    Omnaris 50 Mcg/act Susp (Ciclesonide) .Marland Kitchen... 1- 2 sprays each nostril every night at  bedtime.  Problem # 3:  OBSTRUCTIVE SLEEP APNEA (ICD-327.23) Temporary inability to wear CPAP should  resolve as gum surgery heals.  Medications Added to Medication List This Visit: 1)  Diphenhydramine Hcl 25 Mg Caps (Diphenhydramine hcl) .... Take 1 by mouth once daily 2)  Aspirin 81 Mg Tbec (Aspirin) .... Take 1 by mouth once daily 3)  Omnaris 50 Mcg/act Susp (Ciclesonide) .Marland Kitchen.. 1- 2 sprays each nostril every night at  bedtime.  Other Orders: Est. Patient Level III (45409)  Patient Instructions: 1)  Please schedule a follow-up appointment in 6 months. 2)  Sample/ script Omnaris nasal spray: 1-2 puffs each nostril, every night at bedtime. 3)  Try otc Allegra 60 mg, twice daily if needed, as an alternative to benadryl. Prescriptions: OMNARIS 50 MCG/ACT SUSP (CICLESONIDE) 1- 2 sprays each nostril every night at  bedtime.  #1 x prn   Entered and Authorized by:   Waymon Budge MD   Signed by:   Waymon Budge MD on 06/22/2009   Method used:   Print then Give to Patient   RxID:   [id number]   Appended Document: meds, allergies update Medications Added * ALLERGY VACCINE GO 1:10 twice a week CYMBALTA 60 MG  CPEP (DULOXETINE HCL) Take 1 capsule  by mouth once a day CYMBALTA 30 MG CPEP (DULOXETINE HCL) Take 1 capsule by mouth once a day GLUCOTROL XL 10 MG  TB24 (GLIPIZIDE) Take 1 tablet by mouth once a day LEVEMIR 100 UNIT/ML  SOLN (INSULIN DETEMIR) 30units in the morning, 47+ in the evening XANAX 1 MG  TABS (ALPRAZOLAM) Take 1 tablet by mouth three times a day as needed * CPAP 11 wear at bedtime TRAZODONE HCL 150 MG TABS (TRAZODONE HCL) Take 1 tab by mouth at bedtime ZEGERID 40-1100 MG CAPS (OMEPRAZOLE-SODIUM BICARBONATE) Take 1 capsule by mouth at bedtime MIRALAX  POWD (POLYETHYLENE GLYCOL 3350) 17grams in water daily as needed FISH OIL 1200 MG CAPS (OMEGA-3 FATTY ACIDS) 2 capsules by mouth once daily          Clinical Lists Changes  Medications: Changed medication from GLUCOTROL XL 10 MG  TB24 (GLIPIZIDE) to GLUCOTROL XL 10 MG  TB24 (GLIPIZIDE) Take 1 tablet by mouth  once a day Changed medication from LEVEMIR 100 UNIT/ML  SOLN (INSULIN DETEMIR) 13 units to LEVEMIR 100 UNIT/ML  SOLN (INSULIN DETEMIR) 30units in the morning, 47+ in the evening Added new medication of ZEGERID 40-1100 MG CAPS (OMEPRAZOLE-SODIUM BICARBONATE) Take 1 capsule by mouth at bedtime Added new medication of MIRALAX  POWD (POLYETHYLENE GLYCOL 3350) 17grams in water daily as needed Changed medication from CYMBALTA 60 MG  CPEP (DULOXETINE HCL) to CYMBALTA 60 MG  CPEP (DULOXETINE HCL) Take 1 capsule by mouth once a day Added new medication of CYMBALTA 30 MG CPEP (DULOXETINE HCL) Take 1 capsule by mouth once a day Changed medication from TRAZODONE HCL 100 MG TABS (TRAZODONE HCL) take 1 by mouth at bedtime to TRAZODONE HCL 150 MG TABS (TRAZODONE HCL) Take 1 tab by mouth at bedtime Changed medication from XANAX 1 MG  TABS (ALPRAZOLAM) take one tablet by mouth two  times a day as needed to XANAX 1 MG  TABS (ALPRAZOLAM) Take 1 tablet by mouth three times a day as needed Changed medication from * CPAP 11 to * CPAP 11 wear at bedtime Changed medication from * ALLERGY VACCINE GO 1:10 to * ALLERGY VACCINE GO 1:10 twice a week Added new medication of FISH OIL 1200 MG CAPS (OMEGA-3 FATTY ACIDS) 2 capsules by mouth once daily Removed medication of VICODIN 5-500 MG  TABS (HYDROCODONE-ACETAMINOPHEN) Take 1 tab by mouth Q6 hours as needed for pain Removed medication of MULTIVITAMINS   TABS (MULTIPLE VITAMIN) Take 1 tablet by mouth once a day Removed medication of IRON 325 (65 FE) MG TABS (FERROUS SULFATE) Take one by mouth once daily Removed medication of VITAMIN C 500 MG TABS (ASCORBIC ACID) Take one by mouth once daily Removed medication of DIPHENHYDRAMINE HCL 25 MG CAPS (DIPHENHYDRAMINE HCL) take 1 by mouth once daily Allergies: Added new allergy or adverse reaction of METFORMIN HCL Added new allergy or adverse reaction of LEVAQUIN Added new allergy or adverse reaction of NEURONTIN Added new allergy or  adverse reaction of ENTEX Added new allergy or adverse reaction of ZOCOR Added new allergy or adverse reaction of * JANUVIA Added new allergy or adverse reaction of * AVANDIA Added new allergy or adverse reaction of * LYRICA Added new allergy or adverse reaction of * BELLARYL Added new allergy or adverse reaction of * DESMOPRESSIN ACETATE 0.2MG      updated per med list provided by patient at 06-22-09 ov with Dr. Maple Hudson. Boone Master CNA  June 24, 2009 4:38 PM

## 2010-04-06 NOTE — Letter (Signed)
Summary: CMN for CPAP/[facility]  CMN for CPAP/[facility]   Imported By: Lanelle Bal 06/18/2009 10:07:12  _____________________________________________________________________  External Attachment:    Type:   Image     Comment:   External Document

## 2010-04-06 NOTE — Progress Notes (Signed)
Summary: medication question  Phone Note Call from Patient Call back at Home Phone [phone]   Caller: Patient Call For: young Summary of Call: In office today was told to take allegra 60mg  pharmacy doesn't have it they have allegra 180mg  pls advise. Initial call taken by: Darletta Moll,  June 22, 2009 2:18 PM  Follow-up for Phone Call        Dr.Young, is it okay for pt to take 180 mg allegra incread of 60 mg. Please advise, thanks  Follow-up by: Vernie Murders,  June 22, 2009 3:48 PM  Additional Follow-up for Phone Call Additional follow up Details #1::        Pt was told to get the OTC brand of Allegra; which ever is avaliable will be fine. Reynaldo Minium CMA  June 22, 2009 4:33 PM     Additional Follow-up for Phone Call Additional follow up Details #2::    Spoke with pt and advised per Dr Maple Hudson, okay to get the allegra 180 otc. Follow-up by: Vernie Murders,  June 22, 2009 4:43 PM

## 2010-04-06 NOTE — Progress Notes (Signed)
Summary: sinus congestion  Phone Note Call from Patient Call back at Home Phone [phone]   Caller: Patient Call For: young Summary of Call: pt states that she has taken allegra (per cy's recs) since she saw cy in april. this hasn't helped her at all per pt. she had her allergy shot yesterday and still has sinus congestion. pls advise.  Initial call taken by: Tivis Ringer, CNA,  August 31, 2009 12:53 PM  Follow-up for Phone Call        Spoke with pt. Pt states she tried Careers adviser as recomended by CY for allergies but states this is not helping.  Requesting CY's recs.  Gweneth Dimitri RN  August 31, 2009 1:58 PM   Additional Follow-up for Phone Call Additional follow up Details #1::        If she is mostly stopped up, try otc decongestant  Sudafed-PE.  Can she also try a neti pot for saline rinse?  Otherwise, what does she think might help? Additional Follow-up by: Waymon Budge MD,  August 31, 2009 5:01 PM    Additional Follow-up for Phone Call Additional follow up Details #2::    Spoke with pt.  Her complaint is nasal congestion so I reccomended that she try otc sudafed PE.  Pt states already has neti pot and has been using this. I advised that she call back if this is not helping. Follow-up by: Vernie Murders,  August 31, 2009 5:09 PM

## 2010-04-09 NOTE — Letter (Signed)
Summary: DME/[facility]  DME/[facility]   Imported By: Lester Cutler 12/29/2009 09:17:43  _____________________________________________________________________  External Attachment:    Type:   Image     Comment:   External Document

## 2010-04-26 NOTE — Op Note (Signed)
NAMEDONIS, [name]                 ACCOUNT NO.:  [id number]  MEDICAL RECORD NO.:  [id number]          PATIENT TYPE:  AMB  LOCATION:  DSC                          FACILITY:  [facility]  PHYSICIAN:  Cindee Salt, M.D.       DATE OF BIRTH:  05-26-1945  DATE OF PROCEDURE:  03/31/2010 DATE OF DISCHARGE:                              OPERATIVE REPORT   PREOPERATIVE DIAGNOSES:  Carpal tunnel syndrome, cubital tunnel syndrome left arm.  POSTOPERATIVE DIAGNOSES:  Carpal tunnel syndrome, cubital tunnel syndrome left arm.  OPERATION:  Decompression median nerve of the wrist, decompression ulnar nerve of the elbow, left side.  SURGEON:  Cindee Salt, MD  ASSISTANT:  Carolyne Fiscal, RN  ANESTHESIA:  General with local infiltration.  ANESTHESIOLOGIST:  Bedelia Person, MD.  HISTORY:  The patient is a 65 year old female with a history of carpal tunnel syndrome.  EMG nerve conduction is positive for cubital tunnel syndrome.  Again, nerve conduction is positive.  She desires decompression following failure of conservative treatment.  Pre, peri, and postoperative course have been discussed along with risks and complications.  She is aware that there is no guarantee with the surgery, possibility of infection, recurrence of injury to arteries, nerves, tendons, incomplete relief of symptoms, dystrophy.  The preoperative area of the patient is seen.  The extremity marked by both the patient and surgeon.  Antibiotic given.  PROCEDURE:  The patient was brought to the operating room where a general anesthetic was carried out without difficulty.  She was prepped using ChloraPrep, supine position with the left arm free.  A 3-minute dry time was allowed.  Time-out and taken confirming the patient procedure.  The carpal tunnel was approached first.  Longitudinal incision made in the palm, carried down through subcutaneous tissue. Bleeders were electrocauterized.  Palmar fascia was split, superficial palmar arch  identified, flexor tendon to the ring and little finger identified.  The ulnar side of the median nerve and carpal retinaculum was incised with sharp dissection, right angle and Sewall retractor were placed between skin and forearm fascia.  The fascia released for approximately a centimeter and half proximal to wrist crease under direct vision.  Canal was explored.  Air compression to the nerve was apparent.  No further lesions were identified.  Wound was irrigated. The skin closed with interrupted 5-0 Vicryl Rapide sutures.  Local infiltration was given with 0.25% Marcaine without epinephrine, approximately 5 mL was used.  A separate incision was then made over the medial epicondyle of the left elbow, carried down through subcutaneous tissue.  Bleeders again electrocauterized with bipolar.  Neurovascular structures identified and protected.  The dissection carried down to [facility] fascia.  This was released down to its most posterior aspect. This allowed visualization of the ulnar nerve.  The subcutaneous tissue was then dissected free from the fascia of the flexor carpi ulnaris.  A fasciotomy performed distally after placement of two knee retractors. The muscle was then split longitudinally.  A KMI carpal tunnel guide was then inserted between the nerve and the deep fascia, and this was released with a pair of ENT scissors.  This  was done approximately 6-7 cm distally.  The proximal nerve was then extended to the subcutaneous tissue, again dissected free from the underlying fascia.  The two knee retractors placed.  This allowed visualization of the proximal fascia. The KMI nerve retractor was placed, and the fascia released to the arcade of structures proximally.  The elbow was then flexed to 90 degrees.  No subluxation to the nerve was apparent.  The wound was copiously irrigated with saline.  Bleeders again electrocauterized with bipolar.  The Osborne fascia was then sutured to the  posterior skin flap with 2-0 Vicryl sutures.  Subcutaneous tissue was closed with interrupted 2-0 Vicryl and the skin with a subcuticular 5-0 Vicryl Rapide suture.  Local infiltration was then given with 0.25% Marcaine without epinephrine.  A sterile compressive dressing and long-arm splint applied with the elbow flexed to approximately 30 degrees.  Deflation of the tourniquet, all fingers immediately pinked.  She was taken to the recovery room for observation in satisfactory condition.  She will be discharged to home to return to the [facility] in 1 week, on [place].          ______________________________ Cindee Salt, M.D.     GK/MEDQ  D:  03/31/2010  T:  04/01/2010  Job:  [id number]  Electronically Signed by Cindee Salt M.D. on 04/26/2010 02:24:06 PM

## 2010-06-15 ENCOUNTER — Other Ambulatory Visit (HOSPITAL_COMMUNITY): Payer: Self-pay | Admitting: Orthopedic Surgery

## 2010-06-15 DIAGNOSIS — M25561 Pain in right knee: Secondary | ICD-10-CM

## 2010-06-15 DIAGNOSIS — T84038A Mechanical loosening of other internal prosthetic joint, initial encounter: Secondary | ICD-10-CM

## 2010-06-15 DIAGNOSIS — Z96659 Presence of unspecified artificial knee joint: Secondary | ICD-10-CM

## 2010-06-25 ENCOUNTER — Encounter (HOSPITAL_COMMUNITY): Payer: Self-pay

## 2010-06-25 ENCOUNTER — Ambulatory Visit (HOSPITAL_COMMUNITY): Admission: RE | Admit: 2010-06-25 | Payer: Self-pay | Source: Ambulatory Visit

## 2010-06-25 ENCOUNTER — Encounter (HOSPITAL_COMMUNITY)
Admission: RE | Admit: 2010-06-25 | Discharge: 2010-06-25 | Disposition: A | Discharge status: Home / Self Care | Payer: Medicare Other | Source: Ambulatory Visit | Attending: Orthopedic Surgery | Admitting: Orthopedic Surgery

## 2010-06-25 DIAGNOSIS — T84038A Mechanical loosening of other internal prosthetic joint, initial encounter: Secondary | ICD-10-CM

## 2010-06-25 DIAGNOSIS — M25569 Pain in unspecified knee: Secondary | ICD-10-CM | POA: Insufficient documentation

## 2010-06-25 DIAGNOSIS — Z96659 Presence of unspecified artificial knee joint: Secondary | ICD-10-CM | POA: Insufficient documentation

## 2010-06-25 DIAGNOSIS — M25561 Pain in right knee: Secondary | ICD-10-CM

## 2010-06-25 HISTORY — DX: Presence of unspecified artificial knee joint: Z96.659

## 2010-06-25 MED ORDER — TECHNETIUM TC 99M MEDRONATE IV KIT
22.7000 | PACK | Freq: Once | INTRAVENOUS | Status: AC | PRN
  Administered 2010-06-25: 22.7 via INTRAVENOUS

## 2010-07-13 ENCOUNTER — Ambulatory Visit (INDEPENDENT_AMBULATORY_CARE_PROVIDER_SITE_OTHER): Payer: Medicare Other

## 2010-07-13 DIAGNOSIS — J309 Allergic rhinitis, unspecified: Secondary | ICD-10-CM

## 2010-07-20 NOTE — Op Note (Signed)
NAMEMANHA, AMATO                 ACCOUNT NO.:  [id number]   MEDICAL RECORD NO.:  [id number]          PATIENT TYPE:  INP   LOCATION:  3742                         FACILITY:  [facility]   PHYSICIAN:  Francisca December, M.D.  DATE OF BIRTH:  03-09-1945   DATE OF PROCEDURE:  01/18/2007  DATE OF DISCHARGE:                               OPERATIVE REPORT   PROCEDURES PERFORMED:  1. Insert dual-chamber permanent transvenous pacemaker.  2. Left subclavian venogram.   SURGEON:  Francisca December, M.D.   INDICATIONS:  [name] is a 65 year old woman was admitted yesterday  evening with profound fatigue and shortness of breath.  She was found to  be in second-degree Mobitz type 2 heart block with a ventricular  response of 40-45 beats per minute.  She is brought to the  catheterization laboratory at this time for insertion of a dual-chamber  permanent transvenous pacemaker.   PROCEDURE NOTE:  The patient was brought to the cardiac catheterization  laboratory in the fasting state.  The left prepectoral region was  prepped and draped in the usual sterile fashion.  Local anesthesia was  obtained with the infiltration of 1% lidocaine with epinephrine.  A left  subclavian venogram was then performed with a peripheral injection of 20  mL of Omnipaque.  A digital cine angiogram was obtained and road-  mapped to guide future left subclavian puncture.  The venogram did  demonstrate the vein to be widely patent and coursing in a normal  fashion over the anterior surface of the 1st rib and beneath the middle  third of the clavicle.  There was no evidence for persistence of the  left superior vena cava.   A 6- to 7-cm incision was then made in the deltopectoral groove and this  was carried down by sharp dissection and electrocautery to the  prepectoral fascia.  There, a plane was lifted and a pocket formed  inferiorly and medially with electrocautery and blunt dissection.  Two  left subclavian punctures  were then performed with an 18-gauge thin-wall  needle, through which was passed a 0.038-inch tight J guidewire.  Over  the initial guidewire, a 7-French tearaway sheath and dilator were  advanced.  The dilator and wire were removed and the ventricular lead  was advanced to the level of the right atrium.  The sheath was torn  away.  Using standard technique and fluoroscopic landmarks, the lead was  manipulated onto the interventricular septum; this was an active-  fixation lead and the screw was advanced as appropriate.  It was tested  for diaphragmatic pacing at 10 volts and none was found.  Adequate  pacing parameters were obtained and this is reported below.  The lead  was then sutured into place using 3 separate 0 silk ligatures.  Over the  remaining guidewire, a second 7-French tearaway sheath and dilator were  advanced.  The dilator and wire were removed and the atrial lead was  advanced to the level of the right atrium.  The sheath was then torn  away.  Using standard technique and fluoroscopic landmarks, the  lead was  manipulated into the low right atrial appendage.  There, the lead was  tested for adequate pacing parameters and this is noted below.  It was  also tested for diaphragmatic pacing at 10 volts and none was found.  The lead was then sutured into place using 3 separate 0 silk ligatures.  The pocket was then copiously irrigated using 1% kanamycin solution.  The leads were then attached to the pacing generator, carefully  identifying each by its serial number and placing each into the  appropriate receptacle.  Each lead was tightened into place and tested  for security.  The leads were then wound beneath the pacing generator  and the generator was placed in the pocket.  An 0 silk anchoring suture  was applied.  Hemostasis was obtained by electrocautery and the use of  Surgicel.  The pocket was then closed using 2-0 Vicryl for the  subcutaneous layer in a running fashion.   The skin was approximated  using 4-0 Vicryl in a running subcuticular fashion.  Steri-Strips and a  sterile dressing were applied and the patient was transported to the  recovery area in stable condition in A-sense/V-pace mode.   EQUIPMENT DATA:  The pacing generator is a Medtronic ADAPTA model ADDRL-  1, serial number [id number].  The atrial lead is Medtronic model number  [id number], serial number [id number].  The ventricular lead is a Medtronic  model number [id number], serial number [id number].   PACING DATA:  The right ventricular lead detected a 24.4-mV R wave.  The  pacing threshold was 0.7 volts at 0.5-millisecond pulse width.  The  impedance was 867 ohms, resulting in a current-at-capture threshold of  0.9 mA.  The atrial lead detected a 3.2-mV R wave.  The pacing threshold  was 1.6 volts at 0.5-millisecond pulse width.  The impedance was 778  ohms, resulting in a current-at-capture threshold of 2.7 mA.      Francisca December, M.D.  Electronically Signed     JHE/MEDQ  D:  01/18/2007  T:  01/19/2007  Job:  [id number]   cc:   Lyn Records, M.D.

## 2010-07-20 NOTE — Op Note (Signed)
NAMEANASTON, KOEHN                 ACCOUNT NO.:  [id number]   MEDICAL RECORD NO.:  [id number]          PATIENT TYPE:  AMB   LOCATION:  DAY                          FACILITY:  [facility]   PHYSICIAN:  Jamison Neighbor, M.D.  DATE OF BIRTH:  February 28, 1946   DATE OF PROCEDURE:  02/27/2007  DATE OF DISCHARGE:                               OPERATIVE REPORT   PREOPERATIVE DIAGNOSIS:  Interstitial cystitis.   POSTOPERATIVE DIAGNOSIS:  Interstitial cystitis.   PROCEDURES:  1. Cystoscopy.  2. Urethral calibration.  3. Hydrodistention of the bladder.  4. Marcaine and Pyridium instillation.  5. Marcaine and Kenalog injection.   SURGEON:  Jamison Neighbor, M.D.   ANESTHESIA:  General.   COMPLICATIONS:  None.   DRAINS:  None.   BRIEF HISTORY:  This patient is known to have interstitial cystitis and  she tested not responding to medical therapy,  instillation therapy,  etc. She does, however, respond very well to hydrodistention.  She is  asking for repeat hydrodistention be performed.  She is aware of the  fact there is no guarantee she will obtain a  response  comparable to  that which she has had in the past.  She gave full and informed consent.   PROCEDURE:  After the successful induction of general anesthesia, the  patient was placed in the dorsal lithotomy position, prepped with  Betadine and draped in usual sterile fashion.  Careful bimanual  examination revealed no significant cystocele, rectocele or enterocele.  The urethra was of normal size.   The cystoscope was inserted.  The bladder was carefully inspected.  No  tumors or stones were seen.  Both ureteral orifices were of normal  configuration and location.  The bladder was distended at a pressure of  100 cm water for 5 minutes, when the bladder was drained.  Minimal  glomerulations were seen.  The bladder looked healthy.  There  were no tumors or other irregularities identified.  The patient had  Marcaine and Pyridium left in the  bladder; Marcaine and Kenalog were  injected periurethrally.   The patient tolerated the procedure and was taken to recovery in good  condition.      Jamison Neighbor, M.D.  Electronically Signed     RJE/MEDQ  D:  02/27/2007  T:  02/28/2007  Job:  [id number]

## 2010-07-20 NOTE — H&P (Signed)
NAMECHRISHONDA, HESCH                 ACCOUNT NO.:  [id number]   MEDICAL RECORD NO.:  [id number]          PATIENT TYPE:  INP   LOCATION:  3742                         FACILITY:  [facility]   PHYSICIAN:  Vernice Jefferson, MD          DATE OF BIRTH:  [birth year]   DATE OF ADMISSION:  01/17/2007  DATE OF DISCHARGE:                              HISTORY & PHYSICAL   PRIMARY CARDIOLOGIST:  Dr. Verdis Prime.   CHIEF COMPLAINT:  Chest pain x4 hours.   HISTORY OF PRESENT ILLNESS:  The patient is a 65 year old white female  with a history of hypertension, diabetes and fibromyalgia, who has had a  negative left heart catheterization in 2005 and a negative nuclear  stress test in January 2008, comes in with chest pain that began while  cooking dinner today.  She describes it initially as a fluttering in her  chest, and then progressed to a pressure-like sensation that was  squeezing across her chest.  The patient states that when EMS got there,  she received nitroglycerin, which did not appear to help her pain, only  gave her a headache.  The pain has only been resolved with morphine  while here in the ED.  The patient has never had an MI before in the  past, although she does have a family history.  The patient did not  report any associated symptoms with the shortness of breath, although  she does have some right arm pain that is chronic in nature.   PAST MEDICAL HISTORY:  1. Diabetes.  2. Hypertension.  3. Fibromyalgia.  4. Obstructive sleep apnea.  5. Panic disorder with depression.   SOCIAL HISTORY:  She is married.  No tobacco, no alcohol, no drug use.   FAMILY HISTORY:  Coronary disease, but none early in her family.   MEDICATIONS:  1. Glucotrol-XL.  2. Levemir.  3. Tenormin 50 once a day.  4. Lipitor 20 once a day.  5. Trazodone.  6. Cymbalta.  7. Xanax.  8. Vicodin.  9. Aspirin.   ALLERGIES:  1. COZAAR.  2. KEFLEX.  3. LISINOPRIL.  4. METFORMIN.  5. NEURONTIN.   REVIEW OF  SYSTEMS:  Negative reported review of systems except otherwise  dictated in the above H&P.   PHYSICAL EXAMINATION:  VITAL SIGNS:  Her blood pressure is 146/89, heart  rate of 46, T-max afebrile.  GENERAL:  Well-developed, well-nourished, white female in no acute  distress.  HEENT:  Moist mucous membranes.  No sclerae icterus, conjunctival  pattern.  NECK:  Supple.  Full range of motion.  No jugular venous distention.  CARDIOVASCULAR:  Regular rate and rhythm.  No rubs, murmurs or gallops.  Bradycardic.  CHEST:  Clear to auscultation bilaterally.  No wheezes, rales or  rhonchi.  ABDOMEN:  Nontender, nondistended.  Normoactive bowel sounds.  No  rebound, no guarding.  EXTREMITIES:  No peripheral edema.  Pulses are 2+ bilaterally.  NEURO:  Nonfocal.   A chest x-ray demonstrates no acute infiltrate.  An EKG demonstrates a  normal sinus rhythm with a second-degree AV  block at 2:1 conduction with  an anterior T-wave abnormality.  A prior ECG demonstrated a left bundle  branch block morphology.   LABORATORY DATA:  A white count of 9.2, hemoglobin 13.9, platelets of  234.  Potassium at 3.5, BUN and creatinine are 17 and 0.8.  Cardiac  enzymes are negative x1 set.   IMPRESSION:  1. Acute coronary syndrome, unstable angina.  2. Diabetes mellitus.  3. Hypertension.  4. Obstructive sleep apnea.  5. Fibromyalgia.  6. New second-degree AV block with 2:1 conduction, asymptomatic.   PLAN:  We will admit the patient under telemetry for Dr. Verdis Prime.  Rule out for myocardial infarction by serial enzymes.  Her TIMI score is  3.  We will initiate heparin and aspirin.  Hold beta blocker given her  relative bradycardia rate of 46 with a second-degree AV block.  We will  continue all of her other meds, including her long-acting insulin, as  well as the Glucotrol.  Additionally, we will put her sliding scale  insulin protocol, n.p.o. for risk stratification in the morning to be  determined by  Dr. Katrinka Blazing.  We will continue her CPAP tonight.      Vernice Jefferson, MD  Electronically Signed     JT/MEDQ  D:  01/17/2007  T:  01/18/2007  Job:  [id number]

## 2010-07-20 NOTE — H&P (Signed)
NAMEOLIVIAROSE, PUNCH                 ACCOUNT NO.:  [id number]   MEDICAL RECORD NO.:  [id number]          PATIENT TYPE:  INP   LOCATION:  1823                         FACILITY:  [facility]   PHYSICIAN:  Hollice Espy, M.D.DATE OF BIRTH:  1945/05/02   DATE OF ADMISSION:  10/22/2006  DATE OF DISCHARGE:                              HISTORY & PHYSICAL   PRIMARY CARE PHYSICIAN:  L. Lupe Carney, M.D.   CHIEF COMPLAINT:  Weakness and nausea.   HISTORY OF PRESENT ILLNESS:  The patient is a 65 year old white female  with past medical history of fibromyalgia, interstitial cystitis, and  diabetes mellitus, who has been having problems with bladder spasms as  of late.  She spoke to her urologist, Dr. Logan Bores, who initially called  the patient in some Doxycycline.  The patient tried 2-3 days of  Doxycycline and started having problems with significant nausea.  She  got to the point where she felt like she could barely take any p.o.  She  continued to have problems with cystitis.  She tells me that she was  unable to eat or drink anything and felt worse.  She spoke to Dr. Logan Bores'  partner who  called in a prescription for Bactrim DS.  She tried this  but was only able to take one dose and again she said she felt so  tremendously nauseous that she could not take any more and she came into  the emergency room.  When the patient came into the emergency room she  was noted to have a normal blood pressure, normal BUN and creatinine,  and an albumin of 4.7 which is well within the normal range.  The only  lab of note was a sodium of 120.  There appears to no previous history  of hyponatremia.  The patient continues to complain of feeling very  anxious and very weak.  She felt mildly nauseous.  She tried taking  Phenergan oral p.o. at home, but she said this did not help at all.  She  feels quite fatigued.  She denies any headaches, vision changes,  dysphagia, chest pain, palpitations, shortness of breath,  wheeze, cough.  She complains of lower abdominal pain over the suprapubic area,  especially when she urinates.  No hematuria.  She does complain of some  dysuria.  No constipation or diarrhea.  No focal extremity numbness,  weakness, or pain.  She complains of significant generalized weakness.   REVIEW OF SYSTEMS:  Otherwise negative.   PAST MEDICAL HISTORY:  1. Diabetes mellitus.  2. Fibromyalgia.  3. Depression.  4. Hyperlipidemia.  5. Anxiety.  6. Interstitial cystitis.   MEDICATIONS:  1. She is on Glucotrol 10.  2. Insulin Levemir 25 units subcu daily.  3. Tenormin 50 daily.  4. Lipitor 20 daily.  5. Zegerid 40/1100 daily.  6. Cymbalta 90 daily.  7. Trazodone 100 q.h.s.  8. Xanax 1 mg t.i.d. p.r.n.  9. CPAP nightly.  10.Allergy shot two times a week.  11.Symbicort asthma inhaler p.r.n.  12.Vicodin 5/500 p.r.n.  13.Aspirin daily.   ALLERGIES:  1. She has  reported allergies to [facility].  2. LISINOPRIL.  3. COZAAR.  4. CHROMIC SUTURES.  5. METFORMIN.  6. LEVAQUIN.  7. ACTOS.  8. NEURONTIN.  It is difficult to say which of these are true      allergies and which of these are medication intolerances or      sensitivities.   SOCIAL HISTORY:  She denies any tobacco, alcohol, or drug use.   FAMILY HISTORY:  Noncontributory.   PHYSICAL EXAMINATION:  VITAL SIGNS:  On admission temperature 98.3,  heart rate 76, blood pressure 192/90, respirations 14, O2 saturation 96%  on room air.  GENERAL:  In general the patient is alert and oriented x3.  Some  distress secondary to anxiety.  HEENT:  Normocephalic, atraumatic.  Mucous membranes are moist.  NECK:  She has no carotid bruits.  HEART:  Regular rate and rhythm.  S1/S2.  LUNGS:  Clear to auscultation bilaterally.  ABDOMEN:  Soft, nontender, nondistended.  Positive bowel sounds.  EXTREMITIES:  Show no clubbing, cyanosis, or edema.  She has good  capillary refill.   LABORATORY DATA:  White count 9.8, H&H 14.4 and 42, MCV  86, platelet  count 289, no shift.  Sodium 1230, potassium 2.9, chloride 83, bicarb  27, BUN 7, creatinine 0.6, glucose 157.  LFTs are normal including  albumin of 4.7.  Lipase 24.  UA is completely normal.   ASSESSMENT/PLAN:  1. Hyponatremia.  Difficult to say the etiology of this.  This may      possibly be medication-related.  Will plan to admit the patient on      1500 ml fluid restriction and continue to follow.  2. Nausea.  I feel that likely this is secondary to the patient's      sensitivities to medication and she is not actually dehydrated in      any way.  She has no signs of malnutrition.  Will continue on her      p.o. carb modified diet and      actually would hold off on giving her IV fluids secondary to making      her sodium fall further.  3. Diabetes mellitus.  Continue medications.  4. Anxiety.  Continue Xanax p.r.n.      Hollice Espy, M.D.  Electronically Signed     SKK/MEDQ  D:  10/22/2006  T:  10/23/2006  Job:  [id number]   cc:   L. Lupe Carney, M.D.

## 2010-07-20 NOTE — Op Note (Signed)
NAMEJATON, [name]                 ACCOUNT NO.:  [id number]   MEDICAL RECORD NO.:  [id number]          PATIENT TYPE:  AMB   LOCATION:  DSC                          FACILITY:  [facility]   PHYSICIAN:  Cindee Salt, M.D.       DATE OF BIRTH:  1945-03-19   DATE OF PROCEDURE:  04/05/2007  DATE OF DISCHARGE:                               OPERATIVE REPORT   PREOPERATIVE DIAGNOSES:  1. Carpal tunnel syndrome, right hand.  2. Stenosing tenosynovitis, right thumb.   POSTOPERATIVE DIAGNOSES:  1. Carpal tunnel syndrome, right hand.  2. Stenosing tenosynovitis, right thumb.   OPERATION:  1. Release A1 pulley, right thumb.  2. Release right carpal tunnel.   SURGEON:  Cindee Salt, MD   ASSISTANT:  Carolyne Fiscal, R.N.   ANESTHESIA:  Forearm-based IV regional.   ANESTHESIOLOGIST:  Zenon Mayo, MD.   HISTORY:  The patient is a 65 year old female with a history of carpal  tunnel syndrome, EMG nerve conductions positive, triggering of her right  thumb, not responsive to conservative treatment.  She has elected to  proceed with surgical decompression of each.  Preoperative,  perioperative and postoperative course have been discussed along with  risks and complications.  She is aware there is no guarantee with the  surgery, the possibility of infection, recurrence, injury to arteries,  nerves, tendons, incomplete relief of symptoms, dystrophy.  Questions  have been encouraged and answered.  In the preoperative area the patient  is seen, the extremity marked by both the patient and surgeon,  antibiotic given.   PROCEDURE:  The patient was brought to the operating room, where a  forearm-based IV regional anesthetic was carried out without difficulty.  She was prepped using DuraPrep, supine position, right arm free.  A  transverse incision was made over the A1 pulley of the right thumb,  carried down through subcutaneous tissue, bleeders electrocauterized,  neurovascular bundles identified and  protected, retractors placed.  An  incision was then made on the radial aspect of the A1 pulley.  The  oblique pulley was left intact.  Thumb placed through full range of  motion.  Area of compression to the tendon was immediately apparent.  The thumb placed through full range motion, no further triggering was  evident.  The wound was irrigated, skin closed with interrupted 5-0  Vicryl Rapide sutures.  A longitudinal incision was then made in the  palm, carried down through subcutaneous tissue, bleeders again  electrocauterized, palmar fascia split, superficial palmar arch  identified, the flexor tendon to the ring and little finger identified  to the ulnar side of the median nerve.  The carpal retinaculum was  incised with sharp dissection.  A right angle and Sewell retractor were  placed between skin and forearm fascia, the fascia released for  approximately 1.5 cm proximal to the wrist crease under direct vision.  The canal was explored, area of compression to the nerve was apparent,  and no further lesions were identified.  The wound was irrigated, skin  closed with interrupted 5-0 Vicryl Rapide sutures.  A sterile  compressive dressing  and splint to the wrist applied.  The patient  tolerated the procedure well, was taken to the recovery room for  observation in satisfactory condition.  She is discharged home to return  to the [facility] of [place] in 1 week on Vicodin.           ______________________________  Cindee Salt, M.D.     GK/MEDQ  D:  04/05/2007  T:  04/05/2007  Job:  [id number]   cc:   L. Lupe Carney, M.D.

## 2010-07-20 NOTE — Op Note (Signed)
[name], [name]                 ACCOUNT NO.:  [id number]   MEDICAL RECORD NO.:  [id number]          PATIENT TYPE:  AMB   LOCATION:  DSC                          FACILITY:  [facility]   PHYSICIAN:  Cindee Salt, M.D.       DATE OF BIRTH:  [birth year]   DATE OF PROCEDURE:  12/14/2006  DATE OF DISCHARGE:                               OPERATIVE REPORT   PREOPERATIVE DIAGNOSIS:  Trigger thumb, left thumb.   POSTOPERATIVE DIAGNOSIS:  Trigger thumb, left thumb.   OPERATION:  Release A1 pulley, left thumb.   SURGEON:  Cindee Salt, M.D.   ASSISTANTCarolyne Fiscal, R.N.   ANESTHESIA:  Forearm based IV regional.   DATE OF OPERATION:  December 14, 2006.   HISTORY:  The patient is a 66 year old female with a history of  triggering of her left thumb.  This has not responded to conservative  treatment.  She has elected to undergo surgical decompression to the A1  pulley.  She is aware of risks and complications including infection,  recurrence, injury to arteries, nerves, tendons, incomplete relief of  symptoms, dystrophy.  In the preoperative area the patient is seen.  The  extremity marked by both the patient and surgeon.  The extremity marked,  questions encouraged and answered.   PROCEDURE:  The patient is brought to the operating room where a forearm  based IV regional anesthetic was carried out without difficulty.  She  was prepped using DuraPrep, supine position, left arm free and a  transverse incision was made over the metacarpophalangeal joint crease  of the left thumb, carried down through subcutaneous tissue.  Bleeders  were electrocauterized.  Neurovascular structures identified and  protected.  Retractors were placed.  Thickening of the A1 pulley was  immediately apparent.  This was isolated.  An incision was then made on  the radial aspect releasing the flexor tendon.  This was found to have a  nodule present just proximal to the A1 pulley.  The oblique pulley was  left intact.  Thumb was  placed through a full range motion.  No further  triggering was noted.  The wound was irrigated.  Skin closed with interrupted 5-0 Vicryl Rapide  sutures.  Sterile compressive dressing was applied.  The patient  tolerated the procedure well and was taken to the recovery for  observation in satisfactory condition.  She will be discharged home to  return to the [facility] [place] in 1 week on Vicodin.           ______________________________  Cindee Salt, M.D.     GK/MEDQ  D:  12/14/2006  T:  12/14/2006  Job:  [id number]   cc:   L. Lupe Carney, M.D.  Cindee Salt, M.D.

## 2010-07-20 NOTE — Assessment & Plan Note (Signed)
[facility]                             PULMONARY OFFICE NOTE   NAME:[name], [name] HELTON                        MRN:          [id number]  DATE:01/15/2007                            DOB:          April 27, 1945    PULMONARY FOLLOWUP:   PROBLEM LIST:  1. Allergic rhinitis.  2. Bronchitis.  3. Dysphonia, with nasal congestion.  4. Diabetes.  5. Fibromyalgia.  6. Cerebrovascular accident.  7. Facial pain.  8. Anxiety.  9. Obstructive sleep apnea.  10.Dysphagia.   HISTORY:  She says her nose just swells and that she never gets  anything out of it.  She is afraid to go outside, even onto her porch,  saying that if she steps out into the cold air she stops up.  Anywhere  close to grasping mode the smell of onions being cut causes nasal  congestion and hoarseness.  We discussed irritant effects.  I have  encouraged her to wear a mask or put a scarf over her nose, and by all  means to get out and get around.  She is worse with weather changes.  She focused today on a bothersome bony bump on the left side of her nose  related to an old fracture repair, saying that it makes it hard to wear  her glasses.  She is going to see her eye doctor tomorrow, and suggested  that she get them to help her with choice of glasses frames first rather  than considering surgery for this.  Her medication list is charted and  was reviewed without significant changes noted.   OBJECTIVE:  VITAL SIGNS:  Weight 149 pounds, BP 138/70, pulse 67, room  air saturation 96%.  HEENT:  There is some white mucus on the posterior pharynx, but I could  not tell if it was draining down.  She had the characteristic congested  nasal quality that she usually demonstrates when she is here and is  speaking.  Nasal airway is not obstructed.  LUNGS:  Clear.   IMPRESSION:  No change in chronic complaints, except for her issues of  getting her glasses frames to fit.   PLAN:  1. She will continue CPAP  at 11 CWP.  2. I have suggested Neti pot saline lavage.  3. Schedule return in 4 months, earlier p.r.n.     Clinton D. Maple Hudson, MD, Tonny Bollman, FACP  Electronically Signed    CDY/MedQ  DD: 01/15/2007  DT: 01/16/2007  Job #: [id number]   cc:   L. Lupe Carney, M.D.

## 2010-07-20 NOTE — Assessment & Plan Note (Signed)
[facility]                             PULMONARY OFFICE NOTE   NAME:[name] [name] CONSOLI                        MRN:          [id number]  DATE:07/17/2006                            DOB:          06-10-1945    PROBLEM:  1. Allergic rhinitis.  2. Bronchitis.  3. Dysphonia with nasal congestion.  4. Diabetes.  5. Fibromyalgia.  6. Cerebral vascular accident.  7. Facial pain.  8. Anxiety.  9. Obstructive sleep apnea.  10.Dysphagia.   HISTORY:  She says in the last several days she has developed again her  progressive sense of sinus congestion, and pressure discomfort with some  frontal headache. There has been nothing purulent, no blood, no fever,  no sore throat. She does not wheeze. She has been getting cortisone  injections in her lumbar spine for disc disease and we discussed steroid  therapy. She continues vaccine at 1:10 and has an Epi-Pen. She has found  the allergy vaccine to help her.   MEDICATIONS:  1. Tenormin 50 mg.  2. Lipitor 20 mg.  3. Triamterene/hydrochlorothiazide 37.5.  4. CPAP at 11 CWP.  5. Aspirin 81 mg.  6. Allergy vaccine.  7. Cymbalta 90 mg.  8. Glucotrol 10 mg.  9. Insulin.  10.Zegerid.  11.Symbicort 160/4.5.  12.Astelin p.r.n. use.  13.Trazodone.  14.Foradil.  15.Vicodin.  16.Patanol.  17.She has an Epi-Pen.   DRUG INTOLERANT OF CODEINE, KEFLEX, LISINOPRIL, AND COZAAR.   OBJECTIVE:  Weight 147 pounds, blood pressure 122/68, pulse 81, room air  saturation 96%. Speech is somewhat hoarse and nasal and maybe a little  bit of turbinate edema but not a lot and no excess mucus. Pharynx is not  inflamed, there is no strider.  LUNGS: Clear.   IMPRESSION:  Rhinitis mostly with a vasomotor rhinitis component,  trachitis or tracheobronchitis.   PLAN:  1. Steroid talk done.  2. Dusk mask for any house cleaning or similarly dusty experiences.  3. She wanted to try combination therapy and I compared antihistamines  to decongestants then gave sample Clarinex D 12 hour. As an      alternative she can use over-the-counter Zyrtec with Sudafed or      Sudafed PE. I also asked her to do saline lavage. Schedule return 2      months, earlier p.r.n.     Clinton D. Maple Hudson, MD, Tonny Bollman, FACP  Electronically Signed    CDY/MedQ  DD: 07/17/2006  DT: 07/18/2006  Job #: [id number]   cc:   L. Lupe Carney, M.D.  James L. Malon Kindle., M.D.  Genene Churn. Love, M.D.

## 2010-07-20 NOTE — Assessment & Plan Note (Signed)
[facility]                             PULMONARY OFFICE NOTE   NAME:[name], [name]                        MRN:          [id number]  DATE:09/11/2006                            DOB:          1946-03-01    PROBLEM LIST:  1. Allergic rhinitis.  2. Bronchitis.  3. Dysphonia with nasal congestion.  4. Diabetes.  5. Fibromyalgia.  6. Cerebrovascular accident.  7. Facial pain.  8. Anxiety.  9. Obstructive sleep apnea.  10.Dysphagia.   HISTORY:  She continues on CPAP at 11 CWP without problems.  She may  need spine surgery by Dr. Danielle Dess for disk disease.  She has been staying  in because of air quality problems, but feels a little hoarser than  usual, anyway.  She continues allergy vaccine at 1:10 which has been no  problem.   MEDICATIONS:  1. Tenormin 50 mg.  2. Lipitor 20 mg.  3. Triamterine/hydrochlorothiazide 37.5/12.5.  4. CPAP 11.  5. Aspirin 81 mg.  6. Allergy vaccine.  7. Cymbalta 90 mg.  8. Glucotrol 10 mg.  9. Levemir insulin 13 units.  10.Zegerid.  11.Symbicort 160/4.5 two puffs b.i.d.  12.She has an EpiPen.  13.Astelin p.r.n. use.  14.Trazodone.   Drug intolerant to CODEINE, KEFLEX, LISINOPRIL, COZAAR, AND CHROMIC  SUTURES.   OBJECTIVE:  Weight 149 pounds, BP 144/70, pulse 82, room air saturation  94%.  Voice quality seems at least as good as normal for her.  There is no  stridor.  Nasal airway is not obviously obstructed.  CHEST:  Clear.  Heart sounds regular without murmur.   IMPRESSION:  1. Her pulmonary status is stable should she need general anesthesia,      noting that pulmonary function tests in 2005 had shown moderate      restriction with an FVC of 2000 (67%), FEV1 1600 (68% ratio 0.80).  2. Sleep apnea adequately controlled with continuous positive airway      pressure at 11 CWP.  I have explained that she will need to plan on      taking her mask with her should she need hospital stay for surgery.  3. Atopic  rhinitis and asthma, mild and adequately controlled.  She      continues allergy vaccine without problems.   PLAN:  Schedule return 4 months, earlier p.r.n.     Clinton D. Maple Hudson, MD, Tonny Bollman, FACP  Electronically Signed    CDY/MedQ  DD: 09/11/2006  DT: 09/12/2006  Job #: [id number]   cc:   L. Lupe Carney, M.D.  Evie Lacks, MD  Llana Aliment. Malon Kindle., M.D.  Stefani Dama, M.D.

## 2010-07-23 NOTE — Consult Note (Signed)
[place]  Patient:    [name], [name]                        MRN: [id number] Proc. Date: 05/28/99 Adm. Date:  [id number] Attending:  Erich Montane CC:         Lupe Carney, M.D.                          Consultation Report  DATE OF BIRTH:  02/23/1946  BRIEF HISTORY:  This is one of several [facility] admissions for this 65 year old, right-handed, white, married female seen in the office, March 22,2001, and now admitted for evaluation of vertigo, diplopia, and abnormal MRI study of the brain.  HISTORY OF PRESENT ILLNESS:  In 1997 this patient first developed right ear pain with vague dizziness and was evaluated by Dr. Jearld Fenton with audiograms which were ul but a brain stem auditory evoked response and suggested a central lesion.  MRI study of the brain, Aug 02, 1995, showed multiple long T2 signal changes raising the question of a demyelinating disease versus small vessel ischemic disease. 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  She also had TMJ pain and underwent surgery by Dr. Ocie Doyne in, November of 1997.  Prior to that time she had complained of some headaches and vertigo which seemed to be improved by the surgery.  In March of 1998, she was seen by Dr. Dorma Russell at my request because of recurrent vertigo.  It was felt from his studies that she may have a central cause for vertigo and repeat MRI study, November 07, 1997, showed no change in the small vessel ischemic disease but more prominent in the frontal regions than elsewhere. Because of a left ptosis and noted left pupil larger than the right, an MR angiogram was performed, November 19, 1997, by myself, at [facility] which as normal.  She has persistently had left pupil larger than the right and some left eye myokymia.  In the last 2 weeks she complains of sinus infection with  drainage and allergies. She has had recurrent nausea and vomiting and Sunday night she slept in a room separate from her husband because he was snoring.  She awoke that morning feeling well but then developed headache, neck pain, and vertical diplopia relieved by closing 1 eye.  She was seen at the [facility] emergency room where she was found to have a low potassium and was treated with IV fluids.  She was seen by Dr. Lupe Carney, and referred to me on, May 27, 1999, because of vertical diplopia and gait disorder.  Examination at that time revealed a left hyperphoria and a clockwise nystagmus with unsteady gait.  MRI study the evening of May 28, 1999, showed blood in the third ventricle and then right occipital horn and a lateral ventricle, and she was now admitted for further evaluation.  PAST MEDICAL HISTORY:  Significant for headaches, fibrositis, vertigo, right ear pain, interstitial cystitis.  She is status post TM joint surgery, November 1997 with arthroscopy, depression, cervical radiculopathy, lower back pain, tonsillectomy, cholecystectomy, hysterectomy, nasal surgery, rheumatic fever in  1951, and she is status post right knee surgeries x 3 requiring her to continue to use a brace.  ALLERGIES:  She has a history of allergies to CODEINE, KEFLEX, questionable  IVP  DYE, and CHROMIUM SUTURES.  MEDICATIONS:  1. Tenormin 50 mg q.d.  2. Premarin 0.625 mg q.d.  3. Tricor 200 mg q.d.  4. Prevacid 30 mg q.d.  5. Levsinex 0.375 mg 1/2 to 1 t.i.d.  6. Anusol HC for hemorrhoids.  7. Marc 0.5%/30 cc of heparin 20,000 units instilled interstitially with     catheterization 3 times week when needed.  8. Pyridium plus 200 mg t.i.d.  9. Hydroxyzine 25 mg q.h.s. 10. Maxzide 37.5/25 1 q.d. 11. Neurontin 100 mg t.i.d. 12. Amitriptyline 25 mg q.h.s. 13. Klonipin 0.5 mg 1-2 t.i.d. 14. Prozac 20 mg q.d. p.r.n. 15. Esterase vaginal cream daily 16. P.r.n. lidocaine  ointment 5% p.r.n. 17. Allegra 60 mg b.i.d. 18. Allergy shots b.i.d.  SOCIAL HISTORY:  She has formerly worked as a Scientist, physiological.  She does not drink  alcohol or smoke cigarettes.  FAMILY HISTORY:  Her mother had carotid artery disease and is status post carotid endarterectomy.  Her father died at age 74 from cancer of the lung. Grandfather died at age 31 from heart attack.  Her mother died from cancer and stroke at age 76.  She has two sisters.  She has two children a son and a daughter in good health.  There has been a positive family history of high blood pressure.  A Her sister had diabetes.  Her sister has had migraine headache.  Her cousin has had  cerebral palsy and the rest of the family history is unremarkable.  PHYSICAL EXAMINATION:  GENERAL:  A well-developed white female.  VITAL SIGNS:  Blood pressure right and left arm 140/80, heart rate 80, respiratory rate 20,  HEENT:  No bruits.  Tympanic membranes are clear.  Mouth was in good repair.  LUNGS:  Clear.  BREASTS:  Breaths were normal.  CARDIAC:  There was no heart murmur.  ABDOMEN:  There was no enlargement of the liver, spleen or kidneys.  Bowel sounds were normal.  EXTREMITIES:  There is no cyanosis, clubbing or edema.  MENTAL STATUS:  She was alert and oriented x 3.  She had bilateral ptosis left greater than right with the left pupil larger than the right.  The extraocular movements by ______ cerumen reveals a left hyperphoria.  Corneals were present.  There was no sediment.  Hearing was decreased with air conduction greater than one conduction.  Tongue was midline.  Uvula was midline.  Gags were present. Sternocleidomastoid trapezius testing and all motor examination revealed 5/5 strength proximally and distally in the upper and lower extremities.  She had a  right knee brace.  Sensory examination was intact to pin prick, touch, ______ position, vibration testing.  Deep tendon reflexes  were  2+.  Gait examination revealed a slightly ataxic gait with a tendency to fall to her right.   MRI showed blood in the third ventricle and in the right ventricle and in the occipital horn.  In the right lateral ventricle and the occipital horn there was no evidence of any subarachnoid blood seen.  There was no hydrocephalus present.  IMPRESSION:  1. Intraventricular blood without history of trauma, rule out ACA or basilar tip     aneurysm.  Code 430.  2. Vertigo secondary to #1. Code 780.4  3. Diplopia secondary to #1.  Code 368.42.  4. Fibrositis.  Code 729.1  5. Interstitial cystitis.  Code 595.5.  6. Depression.  Code 311.  7. Neck pain.  Code 723.1.  8. Low back pain.  Code  724.4.  PLAN:  The plan at this time is to admit the patient for IV fluids and arteriography. DD:  05/28/99 TD:  05/29/99 Job: [id number]

## 2010-07-23 NOTE — Assessment & Plan Note (Signed)
[facility]                               PULMONARY OFFICE NOTE   NAME:[name], GERILYN [name]                        MRN:          [id number]  DATE:09/29/2005                            DOB:          1945/05/22    PROBLEM:  1.  Allergic rhinitis.  2.  Diabetes.  3.  Fibromyalgia.  4.  Cerebrovascular accident.  5.  Facial pain.  6.  Dysphonia/nasal congestion.  7.  Anxiety.  8.  Obstructive sleep apnea.  9.  Dysphagia.   HISTORY:  She continues CPAP at 11 cwp, now used all night every night.  She  reports a persistent sense of mild dyspnea with chest tightness, but no  cough over the past month.  She says exposure to her daughter's perfume may  have triggered this but it never went away.  She had root canal surgery on  an upper tooth with some persistent right maxillary discomfort afterwards.  Nasal congestion overall has been better, and she feels her nose is less  swollen, crediting the surgery.  She can tell if she misses her allergy  shots and is very deliberate about continuing.  She feels she breathes  better at night wearing her CPAP than she does in the day without it.  We  spent time today, again, discussing the risks and considerations related to  allergy vaccine including policy under administration outside of a medical  office, anaphylaxis epinephrine, and beta-blocker interference with  epinephrine if ever needed.  She feels comfortable giving her own vaccine  and wishes to continue doing so.  She has signed our Psychologist, counselling.  She has an  appointment pending with Dr. Sandria Manly to follow up after her previous  cerebrovascular accident.   MEDICATIONS:  1.  Tenormin 50 mg.  2.  Lipitor 20 mg.  3.  Triamterene/HCTZ 37.5 mg/12.5 mg.  4.  Nexium 40 mg.  5.  CPAP at 11 cwp.  6.  Aspirin 81 mg.  7.  Allergy vaccine.  8.  Avandia 4 mg.  9.  Urelief Plus.  10. Trazodone.  11. Flonase.  12. Foradil used p.r.n., 1 puff b.i.d.  13. Vicodin.  14.  Cymbalta 90 mg.  15. EpiPen.   DRUG INTOLERANCE:  1.  CODEINE.  2.  KEFLEX.  3.  LISINOPRIL.  4.  COZAAR.  5.  CHROMIC SUTURES.   OBJECTIVE:  VITALS:  Weight 150 pounds.  Blood pressure 136/68.  Pulse  regular 80.  Room air saturation 100%.  GENERAL:  She is quite alert and appropriate today.  She had the persistent,  somewhat nasal quality to her speech which never seems to change and for  which she was evaluated by Dr. Lazarus Salines without particular findings.  HEART SOUNDS:  Regular without murmur or gallop.  There is no neck vein  distention or stridor.  PHARYNX:  Not red or irritated and there is no visible drainage.  CHEST:  Clear.  She demonstrated a little dry cough after forced expiration  only.  There was no edema.   IMPRESSION:  1.  Mild sustained dyspnea,  unexplained.  2.  Obstructive sleep apnea, now adequately controlled with CPAP.  3.  Asthma with possible exacerbation.  4.  Rhinitis and atypical congestion complaint.   PLAN:  1.  Allergy risk issues reviewed as above.  2.  Nebulizer treatment with Xopenex 1.25 mg today, watch for effect.  3.  She can try using a hand fan to generate air movement on hot humid days.      She will continue using her medications as we have discussed today.  4.  Scheduled return in four months.                                   Clinton D. Maple Hudson, MD, FCCP, FACP   CDY/MedQ  DD:  09/29/2005  DT:  09/29/2005  Job #:  [id number]   cc:   L. Lupe Carney, MD  Llana Aliment. Malon Kindle., MD  Genene Churn. Love, MD

## 2010-07-23 NOTE — Op Note (Signed)
[name], [name]                 ACCOUNT NO.:  [id number]   MEDICAL RECORD NO.:  [id number]          PATIENT TYPE:  AMB   LOCATION:  [facility]                         FACILITY:  [facility]   PHYSICIAN:  Jamison Neighbor, M.D.  DATE OF BIRTH:  [birth year]   DATE OF PROCEDURE:  02/01/2005  DATE OF DISCHARGE:                                 OPERATIVE REPORT   PREOPERATIVE DIAGNOSIS:  Interstitial cystitis/painful bladder syndrome.   POSTOPERATIVE DIAGNOSIS:  Interstitial cystitis/painful bladder syndrome.   PROCEDURE:  1.  Cystoscopy.  2.  Urethral calibration.  3.  Hydrodistention of the bladder.  4.  Marcaine and Pyridium instillation.  5.  Marcaine and Kenalog injection.   SURGEON:  Dr. Logan Bores   ANESTHESIA:  General.   COMPLICATIONS:  None.   DRAINS:  None.   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  The patient has responded very  nicely to hydrodistentions in the past.  She does not do particularly well  on oral therapy but has been very aggressively treated with both oral  therapy and instillation therapy in the past.  The patient has requested a  repeat hydrodistention be performed.  She understands that there is no  guarantee that she will have the same improvement she has had in the past.  She has consistently received at least 6 months of relief from a  hydrodistention.  Given the fact that the patient has done well on  instillations and cannot tolerate [place], it certainly seems reasonable to  consider her for this procedure.  She gave full informed consent.   DESCRIPTION OF PROCEDURE:  After successful induction of general anesthesia,  the patient was placed in the dorsolithotomy position and prepped with  Betadine, draped in the usual sterile fashion.  Careful bimanual examination  revealed no significant cystocele, rectocele, or enterocele.  There were no  masses on bimanual exam.  There was  no evidence of any urethral diverticulum  or other mass.  The urethra was calibrated to 30 [place] with female urethral  sounds with no signs of stenosis or stricture.  The cystoscope was inserted,  and the bladder was carefully inspected.  It was free of any tumor or  stones.  Both ureteral orifices were normal in configuration and location.  Previously biopsy sites could be identified and were well-healed.  Hydrodistention in the bladder was then performed.  The bladder was  distended at a pressure of 100 cm of water for 5 minutes.  When the bladder  was drained, the patient really had very little in the way of  glomerulations, and this showed significant improvement in her overall  bladder from her previous hydrodistentions.  The bladder capacity was just  under 1000 mL which is almost normal.  The patient had nothing had required  biopsy.  The bladder was drained.  A mixture of Marcaine and Pyridium was  left within the bladder.  Marcaine and Kenalog were injected periurethrally,  and the patient received intraoperative Toradol and Zofran as well as B&O  suppository.  She will be sent home with Lorcet Plus, Pyridium Plus, and  doxycycline for coverage and will return in follow up in 2-3 weeks.  If at  that point, the patient has not responded to hydrodistention, we certainly  will discourage her from having this done in the future but if she does get  good relief, she will be given the option of other therapeutic options.  Going forward, however, I anticipate this patient will continue to request  that hydrodistention be performed as it has been the only modality that has  worked for her interstitial cystitis.           ______________________________  Jamison Neighbor, M.D.  Electronically Signed     RJE/MEDQ  D:  02/01/2005  T:  02/01/2005  Job:  [id number]   cc:   L. Lupe Carney, M.D.  Fax: [phone]

## 2010-07-23 NOTE — Op Note (Signed)
[facility]  Patient:    [name], [name] Visit Number: [id number] MRN: [id number]          Service Type: NES Location: [facility] Attending Physician:  Darcey Nora Dictated by:   Jamison Neighbor, M.D. Proc. Date: 04/03/01 Admit Date:  04/03/2001                             Operative Report  PREOPERATIVE DIAGNOSIS:  Interstitial cystitis.  POSTOPERATIVE DIAGNOSIS:  Interstitial cystitis.  PROCEDURE:  Cystoscopy, urethral calibration, hydrodistention of the bladder, Marcaine and Pyridium instillation, Marcaine and Kenalog injection.  SURGEON:  Jamison Neighbor, M.D.  ANESTHESIA:  General.  COMPLICATIONS:  None.  DRAINS:  None.  BRIEF HISTORY:  This is a 65 year old female with longstanding interstitial cystitis.  The patient cannot tolerate most of the oral agents and has not responded well to intravesical therapy.  The only therapy that was worked at all for her has been hydrodistention.  The patient may be a candidate for one of the experimental drug trials.  The patient has requested that a hydrodistention be performed.  She understands the risks and benefits of the procedure, and gave full and informed consent.  DESCRIPTION OF PROCEDURE:  After the successful induction of general anesthesia, the patient was placed in the dorsolithotomy position, prepped with Betadine, and draped in the usual sterile fashion.  Careful bimanual examination revealed good support of the bladder and urethra with no cystocele, rectocele, or enterocele detected.  The patient had no evidence of urethral diverticulum or other suburethral mass.  The urethra was of normal caliber accepting a 32-French female urethral sound with no evidence of stenosis or stricture.  Cystoscopy was performed and the bladder was carefully inspected with both 12 degree and 70 degree lenses.  No tumors or stones could be seen.  The ureteral orifices were unremarkable.  Previous biopsy sites  were identified.  The patient was distended at a pressure of 100 cmH2O for five minutes.  When the bladder was drained, the patient had ulcer formation and glomerulations with a bladder capacity of 675 cc.  This is subnormal for a patient of her age with normal being 1150.  The patient did not require a new biopsy.  A mixture of Marcaine and Pyridium was left within the bladder. Lidocaine jelly was injected into the urethra.  The patient had Marcaine and Kenalog injected periurethrally.  A B&O suppository was inserted. Intraoperative Toradol and Zofran were administered.  The patient was taken to the recovery room in good condition.  She will be sent home with prescription for Urised as well as Levaquin.  She already had pain medication.  The patient also received a new prescription for Dyazide when she takes one daily. Dictated by:   Jamison Neighbor, M.D. Attending Physician:  Darcey Nora DD:  04/03/01 TD:  04/03/01 Job: [id number]

## 2010-07-23 NOTE — Op Note (Signed)
[facility]  Patient:    SEMONE, [name] Visit Number: [id number] MRN: [id number]          Service Type: GYN Location: 9300 [id number] Attending Physician:  Sonda Primes Dictated by:   Mardene Celeste. Lurene Shadow, M.D. Proc. Date: 11/29/00 Admit Date:  11/29/2000   CC:         Miguel Aschoff, M.D., OB/GYN   Operative Report  PREOPERATIVE DIAGNOSIS:       Recurring cramping abdominal pain in lower                               abdomen with bilateral pelvic cysts.  POSTOPERATIVE DIAGNOSIS:      Abdominal adhesions and left adnexal cyst.  OPERATION:                    Exploratory laparotomy with adhesiolysis and                               excision of left adnexal cyst.  SURGEON:                      Luisa Hart L. Lurene Shadow, M.D.  ASSISTANT:                    Miguel Aschoff, M.D.  ANESTHESIA:                   General anesthesia.  INDICATIONS:                  The patient is a 65 year old woman who has had recurrent and severe cramping abdominal pain extending from the right lower quadrant across to the left lower abdomen. This has been persistent and unrelenting.  She had had a previous operation for adhesiolysis in the remote past with significant relief of symptoms; however, her symptoms have been getting worse recently. She underwent CT scanning which showed the presence of bilateral pelvic cysts which have, on repeat CT scanning, increased size. We have opted then to bring this patient to the operating room for abdominal exploration, adhesiolysis and adnexal cystectomies.  DESCRIPTION OF PROCEDURE:     Following the induction of satisfactory general anesthesia with the patient positioned supinely, the abdomen was prepped and draped to be included in a sterile operative field. The abdomen was then opened through a midline incision extending from the pubis to just above the umbilicus.  Upon entering the abdomen, multiple intra-abdominal adhesions  and adhesions of the anterior abdominal wall had to be taken down. The entire distal small-bowel was stuck down within the pelvis and against both the left and right adnexal structures and complete large gnarl of bowel, none of which appeared to be significantly obstructed.  Very tedious adhesiolysis was then carried out, taking down all the adhesions of the small-bowel to small-bowel loops and small-bowel to mesenteric loops, completely freeing the entire small intestine down to the ileocecal valve.  Dissection was then carried down into the pelvis on the right side and this area was fully explored by Dr. Tenny Craw and no right-sided pelvic cysts was identified; however, on the left side.  We did find a bilobar cystic structure adhered to the round ligament and this was dissected free by Dr. Tenny Craw and removed in its entirety and forwarded for pathologic evaluation.  All other areas of dissection  were then checked for hemostasis.  Sponge, instruments, and sharp counts were verified.  The peritoneal cavity thoroughly irrigated with normal saline and the wound closed in layers as follows.  The midline closed with a running suture of #1 Novofil, subcutaneous tissues irrigated and skin closed with staples.  Sterile dressings applied.  Anesthetic reversed. The patient removed from the operating room to the recovery room in stable condition.  She tolerated the procedure well. Dictated by:   Mardene Celeste. Lurene Shadow, M.D. Attending Physician:  Sonda Primes DD:  11/29/00 TD:  11/29/00 Job: [id number]

## 2010-07-23 NOTE — Discharge Summary (Signed)
[facility]  Patient:    [name], [name]                        MRN: [id number] Adm. Date:  [id number] Disc. Date: [id number] Attending:  Erich Montane CC:         Lupe Carney, M.D.                           Discharge Summary  PATIENTS ADDRESS:  [address], [place], [place], [address].  DATE OF BIRTH:  May 13, 1945  HISTORY OF PRESENT ILLNESS:  This is one of several [facility] admissions for this 65 year old right-handed, white married female admitted for evaluation of intracranial, intraventricular hemorrhage.  In 1997, this patient first developed right ear pain with vague dizziness and was evaluated by Dr. Suzanna Obey, an ear, nose, and throat physician in [place] [facility] with audiograms which were normal, but a brain stem auditory evoked response suggesting the question of a central lesion. MRI study of the brain Aug 02, 1995, showed multiple long P2 signal changes raising the question of a demyelinating disease versus small vessel disease.  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  She also had TMJ pain and underwent surgery by Dr. Ocie Doyne in November 1997.  Prior to that time, she had complained of headaches and vertigo but these seemed improved after TMJ surgery.  In March, 1998, because of vertigo, she was seen at my request and by Dr. Dorma Russell.  He felt, from  his studies, that there may have been a central cause for her vertigo and a repeat MRI study of the brain November 07, 1997, showed no change in the small vessel ischemic changes which were more prominent in the frontal regions than elsewhere. I also had noted on physical examination a left ptosis and that the left pupil as somewhat larger than the right.  An MR angiogram was performed November 19, 1997, at [facility] which was normal.  She also had some complaints of  left periorbital ______ .  In the two weeks prior to this admission, she had complained of a sinus infection with drainage and allergies.  She had had recurrent nausea and vomiting.  Sunday evening, four days prior to admission, she had gone to bed but, because her husband was snoring, went to lie down in the living room.  She awoke that morning feeling well but, then developed headache, neck pain, and vertical  diplopia relieved by closing one eye.  She was seen at [facility] in the emergency room where she was found to have evidence of a low potassium and was treated with IV fluids.  She was then seen by Dr. Lupe Carney on May 27, 1999, and referred to me for evaluation.  At that time when I saw her, she had examination of left hyperphoria, clockwise nystagmus with an unsteady gait. The MRI study the evening of May 28, 1999, showed blood in the third ventricle and the occipital horn of the right lateral ventricle without significant hydrocephalus, without AVM present, and she was admitted for further evaluation. She had no history of head or neck trauma, no history of drug or alcohol abuse.  There is no family history of aneurysms.  PAST MEDICAL HISTORY:  Significant for headaches, fibrositis, vertigo, right ear pain,  interstitial cystitis, and she is status post TM joint surgery in November of 1997.  She has had arthroscopy to the right knee and is scheduled to have surgery to her right knee.  She has had depression, cervical radiculopathy, lower back pain, tonsillectomy, cholecystectomy, hysterectomy, nasal surgery, rheumatic fever in 1991, and is status post right knee surgery x 3, and uses a brace with her right knee.  ALLERGIES:  She has a history of allergies to CODEINE, KEFLEX, questionably IVP  DYE, and CHROMIUM SUTURES.  MEDICATIONS:  1. Tenormin 50 mg q.d.  2. Premarin 0.625 mg q.d.  3. Tricor 200 mg q.d.  4. Prevacid 30 mg q.d.  5.  Levsinex 0.375 mg a half to one t.i.d.  6. Anusol HC for hemorrhoids.  7. Marcaine 0.5% per 30 cc with heparin 20,000 units instilled interstitially ith     catheterization three times per week when needed for her bladder.  8. Pyridium Plus 200 mg t.i.d.  9. Hydroxyzine hydrochloride 25 mg q.h.s. 10. Maxzide 37.5/25 one q.d. 11. Neurontin 100 mg t.i.d. 12. Amitriptyline 25 mg q.h.s. 13. Klonopin 0.5 mg one to two t.i.d. 14. Prozac 20 mg p.r.n. 15. Estrace vaginal cream daily. 16. Lidocaine ointment 5%. p.r.n. 17. Allegra 60 mg b.i.d. 18. Allergy shots b.i.d.  SOCIAL HISTORY/FAMILY HISTORY:  Dictated elsewhere.  ADMISSION PHYSICAL EXAMINATION:  VITAL SIGNS:  VITAL SIGNS:  Blood pressure 140/80, heart rate 80, respiratory rate 20, and she was afebrile.  GENERAL:  Examination was unremarkable.  NEUROLOGIC:  She was alert and oriented x 3 and followed three-step commands. Her cranial nerves examination revealed bilateral ptosis, left greater than right, with the left pupil slightly larger than the right but very reactive.  The extraocular movements were full except for evidence of a left hyperphoria when he first came in but, in the hospital, she had a left hypophoria.  The rest of her  examination was remarkable for an unsteady gait with a tendency to fall to the right.  LABORATORY STUDIES:  MRI study of the brain showed blood in the third ventricle and in the occipital horn of the right lateral ventricle.  There was no evidence of  hydrocephalus present on her studies.  Her hemoglobin was 10.9 with a repeat of  12.2.  On May 31, 1999, hematocrit was 30.4 with a repeat of 34.4.  White blood count was 5700 with a repeat of 8400.  Her MCVs were 78.6 and 81.1.  Platelet count was 215, then 233K.  An antithrombin III was in the normal range of 100.  A PTT was 28.  An INR was 1.2.  Initial serum sodium was 135 with a repeat of 136. Potassium was 3.9 with a repeat of  3.5.  Chloride was 96 with a repeat of 104.  CO2 was 27 with a repeat of 30.  Glucoses, which were nonfasting, 139 and 171, BUN 8 and 11,  creatinine 0.9 and 1.0, alkaline phosphatase 58 and 59.  Total protein was slightly low at 6.9.  Angiogram was performed on Saturday, May 29, 1999.  This was a four-vessel study which showed no significant abnormalities present.  A repeat CT scan of the brain on May 30, 1999, showed resolution of the blood in the occipital horn.  The third ventricle blood was still present though only a small amount.  Although it was read as possibly showing hydrocephalus, there was no definite evidence of hydrocephalus.  An EKG showed normal sinus rhythm and a normal EKG.  HOSPITAL COURSE:  Patient was admitted and had findings consistent with a central posterior fossa lesion.  She underwent MRI as noted showing evidence of blood and had a cerebral arteriogram showing no evidence of aneurysm present.  In the hospital, her blood pressure was in the normal range and she exhibited no fever. She had very few headaches but did complain of some neck pain.  Studies to look for other causes of bleeding are still pending though an antithrombin III PT and INR have all been normal.  It was thought that she most likely had an interventricular bleed, possibly a small venous malformation or AVM which destroyed itself with he bleed.  At the time of discharge, she was able to walk without assistance but still tumbled to her right and used a patch over either eye to treat her double vision.  IMPRESSION:  1. Double vision, code 368.2.  2. Vertigo, code 780.4.  3. Subarachnoid hemorrhage, intraventricular, code 430.  4. Fibrositis, code 729.1.  5. Interstitial cystitis, code 595.9  6. Depression, code 311.  7. History of neck pain, code 723.1.  8. History of lower back pain, code 724.4.  PLAN:  Discharge the patient on no driving.  She is to discontinue her  possible  surgery to her right knee that had been scheduled over the next few weeks.  DISCHARGE MEDICATIONS:  1. Tenormin 50 mg q.d.  2. Premarin 0.625 mg q.d.  3. Tricor 200 mg q.d.  4. prevacid 30 mg q.d.  5. Levsinex 0.375 one-half to one t.i.d.  6. Anusol HC.  7. Marcaine 0.5% per 30 cc.  8. Heparin 20,000 units in catheter three times per week with intrabladder     medication.  9. Pyridium Plus 200 mg t.i.d. 10. Hydroxyzine hydrochloride 25 q.h.s. 11. Maxzide 37.5/25 one q.d. 12. Neurontin 100 mg t.i.d. 13. Amitriptyline 25 mg q.h.s. 14. Klonopin 0.5 mg one to two t.i.d. 15. Prozac 20 mg q.d. 16. Estrace vaginal cream daily. 17. Lidocaine ointment 5% p.r.n. 18. Allegra 60 mg p.o. b.i.d. 19. Allergy shots b.i.d.  CONDITION ON DISCHARGE:  She is discharged improved from her prehospital status.  DISCHARGE INSTRUCTIONS:  She is not to exert herself, not to drive a car, to return to see me in 10 days to two weeks at which time hopefully other blood studies including factor V leidin, G20210 G to A, protein C, and protein S will have returned. DD:  06/01/99 TD:  06/01/99 Job: [id number]

## 2010-07-23 NOTE — Op Note (Signed)
[name], [name]                           ACCOUNT NO.:  [id number]   MEDICAL RECORD NO.:  [id number]                   PATIENT TYPE:  AMB   LOCATION:  [facility]                                 FACILITY[facility]   PHYSICIAN:  Jamison Neighbor, M.D.               DATE OF BIRTH:  01/19/1946   DATE OF PROCEDURE:  08/30/2002  DATE OF DISCHARGE:                                 OPERATIVE REPORT   SERVICE:  Urology.   PREOPERATIVE DIAGNOSIS:  Interstitial cystitis.   POSTOPERATIVE DIAGNOSIS:  Interstitial cystitis.   PROCEDURE:  Cystoscopy, urethral calibration, hydrodistention of the  bladder, Marcaine and Kenalog pudendal block.   SURGEON:  Jamison Neighbor, M.D.   ANESTHESIA:  General.   COMPLICATIONS:  None.   DRAINS:  None.   BRIEF HISTORY:  This 65 year old female has had a long standing history of  interstitial cystitis. She responds only partially to oral therapy and does  not do well with installation therapy. The patient, however, does respond  very nicely to hydrodistention. The patient has requested that a repeat  hydrodistention be performed. She has not had one done in over night months  and would like to go ahead and have a repeat distention. She is aware of the  fact there is not guarantee she will have the same results she has had in  the past and she is certainly aware of the postoperative pain and discomfort  associated with this procedure. She gave full and informed consent. The  patient did note that she has developed an allergic reaction to Pyridium  plus and for that reason will not receive the usual Pyridium installation  protocol. She gave full and informed consent.   DESCRIPTION OF PROCEDURE:  After successful induction of general anesthesia,  the patient was placed in the dorsal lithotomy position, prepped with  Betadine and draped in the usual sterile fashion. Careful bimanual  examination revealed no significant cystocele, rectocele or enterocele.  There are no masses on bimanual exam. The urethra was palpably normal. It  calibrated at 42 [place] without stenosis or stricture. The cystoscope was  inserted, the bladder was carefully inspected and was free of any tumor or  stones. Both ureteral orifices were normal in configuration and location.  The bladder was distended at a pressure of 100 cm of water for five minutes  and when the bladder was drained there was very modest glomerulation and not  all that much in the way of bleeding. This was actually a much better  appearance that what she has had in the past and this has the general  appearance of burned out interstitial cystitis. The patient did not  require a biopsy. The bladder was drained. No installation was  performed. The patient had a pudendal block performed with Marcaine and  Kenalog. The patient received intraoperative B&O suppository as well as  intraoperative Toradol and Zofran. The  patient tolerated the procedure well  and was taken to the recovery room in good condition.                                               Jamison Neighbor, M.D.    RJE/MEDQ  D:  08/30/2002  T:  08/30/2002  Job:  [id number]   cc:   L. Lupe Carney, M.D.  [address] [place]  [place] [address]  Fax: [phone]

## 2010-07-23 NOTE — Cardiovascular Report (Signed)
[name], [name]                           ACCOUNT NO.:  [id number]   MEDICAL RECORD NO.:  [id number]                   PATIENT TYPE:  OIB   LOCATION:  6501                                 FACILITY:  [facility]   PHYSICIAN:  Lesleigh Noe, M.D.            DATE OF BIRTH:  [birth year]   DATE OF PROCEDURE:  03/25/2003  DATE OF DISCHARGE:  03/25/2003                              CARDIAC CATHETERIZATION   INDICATIONS FOR PROCEDURE:  Recent chest discomfort, left bundle branch  block on EKG, longstanding history of diabetes.  Study is being done to rule  out obstructive coronary disease.   DATE OF PROCEDURE:  March 25, 2003.   PROCEDURE PERFORMED:  1. Left heart catheterization.  2. Selective coronary angiography.  3. Left ventriculography by hand injection.   DESCRIPTION:  After informed consent, a 4-French sheath was placed in the  right femoral artery using modified Seldinger technique.  A 4-French JR  catheter was used for hemodynamic recordings, left ventriculography by hand  injection and selective right coronary angiography.  A 4 French left Judkins  #4 catheter was used for left coronary angiography.  200 mcg of  intracoronary nitroglycerin was administered into the left coronary.  The  patient tolerated the procedure without complications.   RESULTS:   I. HEMODYNAMIC DATA:  A.  Aortic pressure 146/62.  B.  Left ventricular pressure 146/18 mmHg.   II. LEFT VENTRICULOGRAPHY:  The left ventricular cavity size is normal.  EF  is 65%.  No regional wall motion abnormality.   III. CORONARY ANGIOGRAPHY:  A.  Left main coronary:  No significant  obstruction noted.  B.  Left anterior descending coronary:  The LAD contains diffuse distal  third narrowing without focal obstructive lesions.  It would appear to me  that this does represent some diffuse coronary disease, but no focal  obstruction was noted.  Certainly, no proximal or mid vessel focal  obstruction.  C.   Circumflex artery:  The circumflex coronary artery is large.  It gives  origin to three obtuse marginal branches.  There are no regions of  significant obstruction.  The second obtuse marginal branch is large and  bifurcates.  The PDA also arises from the right coronary.  D.  Right coronary:  The right coronary is small, nondominant.  Bifurcates  into a large right ventricular branch and is normal.   CONCLUSIONS:  1. Widely patent proximal coronaries.  There is mild to moderate distal     diffuse left anterior descending disease of the apex.  2. Normal left ventricular function.  3. Hypokalemia identified on pre cath blood work.   PLAN:  K-Dur 40 mEq per day.  Aggressive risk factor modification.  No  specific therapy for angina or ischemic symptoms is required as there appear  to be no focal high grade obstructive lesions noted.  The patient should be  on aspirin per day as  well.                                               Lesleigh Noe, M.D.    HWS/MEDQ  D:  03/25/2003  T:  03/25/2003  Job:  [id number]   cc:   Genene Churn. Love, M.D.  [address]  [address]  [place]  [place] [address]  Fax: [phone]   L. Lupe Carney, M.D.  [address] [place]  [place] [address]  Fax: [phone]

## 2010-07-23 NOTE — Op Note (Signed)
[facility]  Patient:    [name], [name]                        MRN: [id number] Proc. Date: 12/10/99 Adm. Date:  [id number] Attending:  Londell Moh CC:         Llana Aliment. Randa Evens, M.D.  Madie Reno A. Dub Mikes, M.D.  Abran Cantor. Clovis Riley, MD  Genene Churn. Love, M.D.   Operative Report  PREOPERATIVE DIAGNOSIS:   Enterocele.  POSTOPERATIVE DIAGNOSIS:  Enterocele.  OPERATION:  Cystoscopy, urethral calibration, hydrodistention of bladder with Marcaine and Pyridium instillation, Marcaine and Kenalog injection, and Clorpactin lavage.  SURGEON:  Jamison Neighbor, M.D.  ANESTHESIA:  General.  COMPLICATIONS:  None.  DRAIN: None.  BRIEF HISTORY:  This 65 year old female has had longstanding problems with painful bladder syndrome.  The patient initially had a significant number of glomerulations, but over the years due to therapy as well as progressive on the disease, the patients bladder has become more fibrotic, and she has fewer glomerulations.  She does, however, respond very nicely to hydrodistention with a nice decrease in her symptoms as they can often last as long as one year.  The patient had her last hydrodistention just under one year ago and has requested a repeat procedure be done to try to control some of her lower urinary tract symptoms.  The patient is on an aggressive medication list with includes Pyridium Plus, Neurontin, amitriptyline, Klonopin, and Xanax, all for the management of her interstitial cystitis and fibromyalgia.  She is on other medications for other purposes as well.  We are not doing any instillations or ______  at this time due to allergic problems.  The patient understands the risks and benefits of the procedure including the fact that there is no guarantee she will have improvement in this problem.  She gave full and informed consent.  DESCRIPTION OF PROCEDURE:  After successful induction of general anesthesia, the patient  was placed in the dorsolithotomy position, prepped with Betadine, and draped in the usual sterile fashion.  Careful bimanual examination revealed no abnormalities of the urethra, specifically, no evidence of urethrocele or diverticular disease.  There was no mass on bimanual exam.  She had no cystocele, rectocele, or enterocele.  Urethra was calibrated at 34-French with female urethral sounds with no evidence of stenosis or stricture.  The bladder was carefully inspected.  It was free of any tumor or stones.  Both ureteral orifices were normal in configuration and location. The bladder was distended with a pressure 100 cm of water for five minutes, when the bladder was drained, the capacity was between 750 and 800 cc which is lower than normal for a patient of her age but is exactly the same as what she has had on her last three hydrodistentions indicating that her disease is stable with no further progression or decrease in bladder capacity.  The patient had only a modest number of glomerulations.   No biopsies were felt to be indicated.  Clorpactin lavage was performed at a low pressure after 1 liter of lavage, the bladder was irrigated with saline.  She then had Marcaine and Pyridium left in the bladder.  Marcaine and Kenalog were injected periurethrally.  The patient tolerated the procedure well and was taken to the recovery room in good condition.  PLAN:  She will be given a prescription for Phenergan for nausea.  She will have two or three days of antibiotics and will  have pain medication for a short period of time. She will return to the office in followup. DD:  12/10/99 TD:  12/11/99 Job: [id number]

## 2010-07-23 NOTE — Procedures (Signed)
[name], [name]                 ACCOUNT NO.:  [id number]   MEDICAL RECORD NO.:  [id number]          PATIENT TYPE:  OUT   LOCATION:  SLEEP CENTER                 FACILITY:  [facility]   PHYSICIAN:  Clinton D. Maple Hudson, M.D. DATE OF BIRTH:  1946/01/01   DATE OF STUDY:  05/17/2005                              NOCTURNAL POLYSOMNOGRAM   REFERRING PHYSICIAN:  Dr. Jetty Duhamel.   DATA:  May 17, 2005.   INDICATION FOR STUDY:  Hypersomnia with sleep apnea.   EPWORTH SLEEPINESS SCORE:  5/24.   BMI:  27.6.   WEIGHT:  156 pounds.   The patient currently uses home C-PAP at 11 CWP and sleep is affected by  incontinence.   HOME MEDICATIONS:  Glucotrol XL, Nexium, Xanax 1 mg, Avandia, Cymbalta 90  mg, Tenormin, trazodone 50 mg, triamterene/HCTZ, Peridium, baby aspirin,  Vicodin 5/500 mg p.r.n.   SLEEP ARCHITECTURE:  Total sleep time 350 minutes with sleep efficiency 78%.  Stage I was 1%, stage II 65%, stages III and IV were absent, REM 12% of  total sleep time.  Sleep latency 74 minutes, REM latency 47 minutes, awake  after sleep onset 23 minutes, arousal index 13.  Trazodone 50 mg was taken  at 10 p.m.   RESPIRATORY DATA:  Split study protocol:  Apnea/hypopnea index (AHI, RDI)  25.3 obstructive events per hour indicating moderate obstructive sleep  apnea/hypopnea syndrome.  This reflected 54 hypopneas before C-PAP.  Events  were nonpositional.  REM AHI 0.  C-PAP was titrated to a recommended  pressure of 11 CWP, AHI 1.5 per hour.  The patient brought an ultra mirage  ResMed mask with her but was allowed to try a profile comfort gel mask, size  petite, for this study at her request.   OXYGEN DATA:  Mild to moderate snoring was suppressed by final C-PAP with  occasional breakthrough snoring up to 12 CWP.  Oxygen saturation nadir was  90%.  Mean oxygen saturation on C-PAP  was 95%.   CARDIAC DATA:  Normal sinus rhythm.   MOVEMENT/PARASOMNIA:  No significant body movement or behavioral  abnormalities noted.   IMPRESSION/RECOMMENDATIONS:  1.  Moderate obstructive sleep apnea/hypopnea syndrome, AHI 25.3 per hour      with nonpositional events, hypopneas.  Mild to moderate snoring with      oxygen desaturation to a nadir of 90%.  2.  Successful C-PAP titration to 11 CWP, AHI 1.5 per hour.  A petite      profile ComfortGel mask was used for this study with heated humidifier.      Clinton D. Maple Hudson, M.D.  Diplomate, Biomedical engineer of Sleep Medicine  Electronically Signed     CDY/MEDQ  D:  05/29/2005 10:19:32  T:  05/30/2005 22:54:18  Job:  [id number]

## 2010-07-23 NOTE — Assessment & Plan Note (Signed)
[facility]                             PULMONARY OFFICE NOTE   NAME:[name], [name] MAVES                        MRN:          [id number]  DATE:01/30/2006                            DOB:          September 27, 1945    PROBLEMS:  1. Allergic rhinitis.  2. Diabetes.  3. Fibromyalgia.  4. Cerebrovascular accident.  5. Facial pain.  6. Dysphonia/nasal congestion.  7. Anxiety.  8. Obstructive sleep apnea.  9. Dysphagia.   HISTORY:  She says she is doing well using CPAP 11CWP routinely.  She  has been under a lot of stress related to her mother's health but this  is better.  Breathing has been stable with no recent exacerbations.  Some bothersome mask leak.  Mask fit was effected by dental surgery  which required that she not wear anything to put pressure on her upper  lip area.  She continues her allergy vaccine at 1:10 without problems.   MEDICATIONS:  1. Tenormin 50 mg.  2. Lipitor 200 mg.  3. Triamterene/HCTZ 37.5/12.5.  4. Nexium 40 mg.  5. CPAP at 11CWP.  6. Aspirin 81 mg.  7. Allergy vaccine.  8. Cymbalta 90 mg  9. Glucotrol XL 10 mg.  10.Insulin.  11.EpiPen.  12.Astelin.  13.Trazodone.  14.Foradil.  15.Vicodin.   DRUG INTOLERANCE:  CODEINE, KEFLEX, LISINOPRIL, COZAAR, CHROMIC SUTURES.   OBJECTIVE:  VITAL SIGNS:  Weight 149 pounds.  BP 132/78.  Pulse regular  at 59.  Room air saturation 98%.  HEENT:  Chronic nasal sounding speech quality is unchanged.  Pharynx is  not inflamed.  I see no post nasal drainage.  LUNGS:  Clear.   IMPRESSION:  Both the sleep apnea and allergic rhinitis are adequately  controlled for now.   PLAN:  She will work with Christoper Allegra to try different masks for fits and  styles until she gets something that will do for her.  Schedule return  in four months, earlier p.r.n.     Clinton D. Maple Hudson, MD, Tonny Bollman, FACP  Electronically Signed    CDY/MedQ  DD: 01/31/2006  DT: 02/01/2006  Job #: [id number]   cc:   L. Lupe Carney,  M.D.  James L. Malon Kindle., M.D.  Genene Churn. Love, M.D.

## 2010-07-23 NOTE — Op Note (Signed)
[name], [name]                 ACCOUNT NO.:  [id number]   MEDICAL RECORD NO.:  [id number]          PATIENT TYPE:  AMB   LOCATION:  [facility]                         FACILITY:  [facility]   PHYSICIAN:  Jamison Neighbor, M.D.  DATE OF BIRTH:  26-Dec-1945   DATE OF PROCEDURE:  06/01/2004  DATE OF DISCHARGE:                                 OPERATIVE REPORT   PREOPERATIVE DIAGNOSIS:  Interstitial cystitis.   POSTOPERATIVE DIAGNOSIS:  Interstitial cystitis.   PROCEDURE:  Cystoscopy, urethral calibration, hydrodistention of the  bladder, Marcaine and Pyridium instillation, Marcaine and Kenalog injection.   SURGEON:  Jamison Neighbor, M.D.   ANESTHESIA:  General.   COMPLICATIONS:  None.   DRAINS:  None.   BRIEF HISTORY:  This 65 year old female has interstitial cystitis.  The  patient has not responded particularly well to oral therapy or to  instillation therapy but has a remarkably good response to hydrodistention.  Whenever the patient has hydrodistention, she only gets six months or more  of relief, to the point where she can function normally.  The patient has  requested a repeat hydrodistention be performed.  The patient has had a  return of her urgency and frequency.  It is not well controlled.  She  understands there is certainly no guarantee that she will have comparable  results to what she has had in the past, but she is willing to go ahead and  undergo the hydrodistention.  She is aware that if she does get a good  response, it may not last and that certainly she will not see the response  for several days up to a week.  The patient gave full informed consent for  the procedure.   PROCEDURE:  After the successful induction of general anesthesia, the  patient is placed in a dorsal lithotomy position, prepped with Betadine, and  draped in the usual sterile fashion.  Careful bimanual examination revealed  no significant cystocele, rectocele, or enterocele.  There was a very  little  bit of vaginal stenosis but certainly nothing significant.  The urethra was  in normal position with no signs of diverticulum and no prolapse of any  kind.  No discharge was identified.  The urethra was calibrated to a 7  [place] with female urethral sounds but no signs of stenosis or stricture.  The cystoscope was inserted.  The bladder was carefully inspected.  It was  free of any tumor or stones.  Both ureteral orifices were normal in  configuration and location.  Clear urine was seen to efflux from each.  Hydrodistention was then performed at a pressure of 100 cm of water for five  minutes.  When the bladder was drained, glomerulations could be seen  throughout the bladder.  There were 1-2 patchy areas, somewhat consistent  with ulcer formation.  The number of total glomerulations was fairly modest.  The bladder capacity was actually close to normal at just under 1000 cc.  This compares quite favorably to the normal capacity for patients with  interstitial cystitis, which averages 575, and is closer than normal for  non-  IC patients of 1150.  The patient did not require biopsy.  A mixture of  Marcaine and Pyridium was left within the bladder.  Marcaine and Kenalog  were  injected as the postoperative block.  The patient received intraoperative  Toradol and Zofran as well as a preoperative B&O suppository.  She will be  sent home with antibiotic coverage as well as pain medication and will  return to see [place] in followup in 2-3 weeks time.      RJE/MEDQ  D:  06/01/2004  T:  06/01/2004  Job:  [id number]

## 2010-07-23 NOTE — Op Note (Signed)
[facility]  Patient:    [name], [name]                     MRN: [id number] Proc. Date: 05/05/00 Adm. Date:  [id number] Disc. Date: [id number] Attending:  Londell Moh                           Operative Report  SERVICE:  Urology.  PREOPERATIVE DIAGNOSIS:  Interstitial cystitis.  POSTOPERATIVE DIAGNOSIS:  Interstitial cystitis.  PROCEDURE:  Cystoscopy, urethral calibration, hydrodistention of the bladder, Marcaine and Pyridium installation, Marcaine and Kenalog injection.  SURGEON:  Dr. Logan Bores.  ANESTHESIA:  General.  COMPLICATIONS:  None.  DRAINS:  None.  BRIEF HISTORY:  This 65 year old female has had longstanding problems with interstitial cystitis. The patient responds better to hydrodistention compared to any other therapy. The patients symptoms have become severe and she has requested that a repeat distention be performed. She understands the risks and benefits of the procedure including the fact that there is no guarantee she will have the response that she has had in the past and she gave full and informed consent.  DESCRIPTION OF PROCEDURE:  After successful induction of general anesthesia, the patient was placed in dorsal lithotomy position, prepped with Betadine and draped in the usual sterile fashion. Cystoscopy was performed and the urethra was calibrated to 35 [place] with female urethral sounds. The cystoscope was inserted, the bladder was carefully inspected and was free of any tumor or stones. Both ureteral orifices were normal in configuration and location. Hydrodistention of the bladder was then performed. The bladder was distended to a pressure of 100 cm of water for five minutes and when the bladder was drained, glomerulation was seen throughout the bladder but bladder capacity was still excellent at 800 cc. No biopsy was necessary as this had been done multiple times in the past. The patient had Marcaine and Pyridium  placed within the bladder, Marcaine and Kenalog injected periurethrally. She received intraoperative Toradol as well as Zofran. She received a B&O suppository in the recovery room. She will be given a prescription of Lorcet plus to take as needed for pain and Pyridium plus for postoperative symptom management and doxycycline. She will return to the office in three weeks time. DD:  05/05/00 TD:  05/08/00 Job: [id number]

## 2010-07-23 NOTE — Op Note (Signed)
NAMEHAZELENE, [name]                 ACCOUNT NO.:  [id number]   MEDICAL RECORD NO.:  [id number]          PATIENT TYPE:  AMB   LOCATION:  [facility]                         FACILITY:  [facility]   PHYSICIAN:  Jamison Neighbor, M.D.  DATE OF BIRTH:  [birth year]   DATE OF PROCEDURE:  12/06/2005  DATE OF DISCHARGE:                                 OPERATIVE REPORT   PREOPERATIVE DIAGNOSIS:  Interstitial cystitis.   POSTOPERATIVE DIAGNOSIS:  Interstitial cystitis.   PROCEDURES:  1. Cystoscopy.  2. Urethral calibration.  3. Hydrodistention of the bladder per insufflation of Marcaine and Calan      injection.   SURGEON:  Jamison Neighbor, M.D.   ANESTHESIA:  General.   COMPLICATIONS:  None.   DRAINS:  None.   BRIEF HISTORY:  This 65 year old female has had longstanding problems with  chronic pelvic pain and generally responds well to cystoscopy and  hydrodistention. Her last one was done almost one year ago.  The patient has  had a return of her symptoms and feels that her situation has gotten worse  and requests that we go ahead and take care of this problem. She is  certainly aware of the fact that there is no guarantee she will have  comparable response to the hydrodistention when done this time and that  there is certainly no guarantee that she will have improvement. She  understands the risks and benefits of the procedure and gave full informed  consent.   PROCEDURE:  After successful induction of general anesthesia the patient was  placed in dorsal lithotomy position, prepared with Betadine and draped in  usual sterile fashion.  Careful bimanual examination revealed an  unremarkable bladder with no signs of cystocele, rectocele or enterocele.  There was no evidence of urethral diverticulum. The urethra was calibrated  to 32-French female urethral sounds. The cystoscope was inserted and the  bladder was carefully inspected. No tumors or stones were seen. The bladder  was unremarkable in  its appearance. The mucosa was normal in all areas.  The  ureters were completely normal.  The urine coming out of each appeared  clear.  The patient underwent hydrodistention of the bladder at 100 cm of  water for 5 minutes. Then the bladder was drained and the bladder capacity  was normal. There was little in the way of glomerulations and this appeared  to be a much  better appearing kidney than she has had in the past. The bladder was  drained.  A mixture of Marcaine and Pyridium was left in the bladder.  Marcaine and Kenalog were injected periurethrally.  The patient tolerated  the procedure well and was taken to the recovery room in good condition.           ______________________________  Jamison Neighbor, M.D.  Electronically Signed     RJE/MEDQ  D:  12/06/2005  T:  12/07/2005  Job:  [id number]   cc:   L. Lupe Carney, M.D.  Fax: [phone]

## 2010-07-23 NOTE — Discharge Summary (Signed)
[facility]  Patient:    [name], [name] Visit Number: [id number] MRN: [id number]          Service Type: GYN Location: 9300 [id number] Attending Physician:  Sonda Primes Dictated by:   Mardene Celeste. Lurene Shadow, M.D. Admit Date:  11/29/2000 Disc. Date: 12/04/00   CC:         Miguel Aschoff, M.D.   Discharge Summary  ADMISSION DIAGNOSES:          1. Abdominal pain, secondary to intra-abdominal                                  pelvic adhesions.                               2. Left pelvic cyst.  DISCHARGE DIAGNOSES:          1. Abdominal pain, secondary to intra-abdominal                                  pelvic adhesions.                               2. Left pelvic cyst.  PROCEDURE IN HOSPITAL:        Exploratory laparotomy and adhesiolysis and excision of left adnexal cyst.  SURGEONS:                     Luisa Hart L. Lurene Shadow, M.D., and Miguel Aschoff, M.D.  COMPLICATIONS:                None.  CONDITION ON DISCHARGE:       Improved.  HISTORY OF PRESENT ILLNESS:   [name] is a 65 year old woman who is status post total abdominal hysterectomy and multiple other abdominal surgeries for adhesiolysis.  She has begun having recurrent cramping, abdominal pain across the lower abdomen associated with some nausea but without vomiting.  She had a CT scan which did not show any obstruction.  It did, however, show bilateral pelvic cysts which were indeterminate.  Because of her persistent abdominal pain, she was returned to the operating room on the day of admission where she underwent exploratory laparotomy and rather extensive adhesiolysis of multiple adhesions as well as excision of the left-sided pelvic cyst which on pathologic evaluation, turns out to be a cyst of the fallopian tube remnant. Her postoperative course has been benign with the usual resumption of diet and bowel activity.  At the time of discharge her wounds are healing satisfactorily and she  is tolerating a regular diet.  She is being discharged to be followed up in our office in two weeks.  DISCHARGE MEDICATIONS:        Vicodin one two two every four hours p.r.n. for pain.  ACTIVITY:                     As tolerated.  DIET:                         A 2000 calorie ADA diet.  FOLLOW-UP:                    She will be followed  up in the office in two weeks. Dictated by:   Mardene Celeste. Lurene Shadow, M.D. Attending Physician:  Sonda Primes DD:  12/04/00 TD:  12/04/00 Job: [id number]

## 2010-07-23 NOTE — Op Note (Signed)
[name], [name]                           ACCOUNT NO.:  [id number]   MEDICAL RECORD NO.:  [id number]                   PATIENT TYPE:  AMB   LOCATION:  [facility]                                 FACILITY:  [facility]   PHYSICIAN:  Jamison Neighbor, M.D.               DATE OF BIRTH:  31-Aug-1945   DATE OF PROCEDURE:  01/01/2002  DATE OF DISCHARGE:                                 OPERATIVE REPORT   SERVICE:  Urology.   PREOPERATIVE DIAGNOSES:  Interstitial cystitis. Secondary diagnosis flank  pain.   POSTOPERATIVE DIAGNOSES:  Interstitial cystitis. Secondary diagnosis flank  pain.   PROCEDURE:  Cystoscopy, bilateral retrogrades with interpretation, urethral  calibration, hydrodistention of the bladder, Marcaine and Pyridium  installation, Marcaine and Kenalog injection.   SURGEON:  Jamison Neighbor, M.D.   ANESTHESIA:  General.   COMPLICATIONS:  None.   DRAINS:  None.   BRIEF HISTORY:  This 65 year old female has long standing problems with  known interstitial cystitis. She just completed an eight month long  experimental drug trial and has had a return of her symptoms. The patient  has requested a hydrodistention be performed. The patient did not respond to  any form of oral therapy and does not do particularly well with installation  therapy but has always had a nice response to hydrodistention. She is aware  of the fact that there is no guarantee she will have the same improvement  that she has had in the past and if she does have improvement, there is on  guarantee that it will be long-term improvement. She has requested that  retrograde studies be done because she has had persistent right sided flank  pain and occasional left sided pain. She will undergo diagnostic retrogrades  to ensure that there is no obstruction or filling defect a the time of her  hydrodistention. She understands the risks and benefits of the procedure as  detailed above and gave full and informed  consent.   DESCRIPTION OF PROCEDURE:  After successful induction of general anesthesia,  the patient was placed in the dorsal lithotomy position and prepped with  Betadine and draped in the usual sterile fashion. Careful bimanual  examination revealed no significant cystocele, rectocele or enterocele.  There are no masses on bimanual exam. The urethra is of normal caliber  accepting a 27 [place] female urethral sound with no evidence of stenosis or  stricture. The cystoscope was inserted, the bladder was carefully inspected  and was free of any tumor or stones. Both ureteral orifices were normal in  configuration and location. Retrograde studies were performed bilaterally  with a cone tip catheter on each side. The upper portion of the collecting  system was normal as were the ureters. The upper pole on the right hand side  showed a somewhat compound appearance but there was normal drainage, no  obstructive and no filling defect seen, no sign  whatsoever of any stone  material. The left hand side did not have a compound upper pole, it was  otherwise identical with finely kept calices, a small pelvis and no signs of  hydronephrosis. Drain out films at the end of the procedure showed that both  sides drained normally with no obstruction. The bladder was then distended  at a pressure of 100 cm of water for five minutes and when the bladder  drained the patient had a capacity of 825 cc. Only a modest number of  glomerulations could be seen. The patient had a mixture of Marcaine and  Pyridium placed within the bladder, Marcaine and Kenalog were injected  periurethrally. The patient received intraoperative Toradol and Zofran as  well as a B&O suppository. She was taken to the recovery room in good  condition. She will be sent home with Lorcet plus, Pyridium plus and  Levaquin. She will be advised that her bladder capacity has actually  increased from her last hydrodistention.                                                Jamison Neighbor, M.D.    RJE/MEDQ  D:  01/01/2002  T:  01/01/2002  Job:  [id number]

## 2010-07-23 NOTE — Procedures (Signed)
[name], [name]                 ACCOUNT NO.:  [id number]   MEDICAL RECORD NO.:  [id number]          PATIENT TYPE:  OUT   LOCATION:  SLEEP CENTER                 FACILITY:  [facility]   PHYSICIAN:  Clinton D. Maple Hudson, M.D. DATE OF BIRTH:  1945-10-16   DATE OF STUDY:  04/08/2004                              NOCTURNAL POLYSOMNOGRAM   REFERRING PHYSICIAN:  Jetty Duhamel, MD   INDICATIONS FOR STUDY:  Hypersomnia with sleep apnea. Original NPSG December 24, 1992, RDI of 0. Repeat study June 19, 2000, RDI of 49 per hour with  CPAP titrated to 10 CWP.  She has been using home CPAP with 7 CWP, but  complains of excessive daytime fatigue. CPAP titration is requested.   SLEEP ARCHITECTURE:  Total sleep time 380 minutes with sleep efficiency of  89%. Stage I was 2%, stage II was 83%, stages III and IV were absent, REM  was 15% of total sleep time. Latency to sleep onset 10 minutes. Latency to  REM 146 minutes. Awake after sleep onset 37 minutes. Arousal index 6.3. She  took 100 mg of trazodone at 9:30 p.m.   RESPIRATORY DATA:  CPAP titration protocol. CPAP was titrated to 11 CWP, RDI  4.2 per hour using patient's own [employer] mask, full face, with  heated humidifier.   OXYGEN DATA:  Snoring was prevented by CPAP. Oxygen saturation was well  maintained around 96% on CPAP on room air.   CARDIAC DATA:  Normal sinus rhythm.   MOVEMENT/PARASOMNIA:  Occasional leg jerk with insignificant effect on  sleep.   IMPRESSION/RECOMMENDATIONS:  CPAP titration to 11 CWP, RDI of 4.2 per hour.  The patient's own full face [employer] mask was used with heated  humidifier.      CDY/MEDQ  D:  04/18/2004 10:04:47  T:  04/18/2004 19:06:46  Job:  [id number]

## 2010-07-23 NOTE — Op Note (Signed)
   [name], [name]                           ACCOUNT NO.:  [id number]   MEDICAL RECORD NO.:  [id number]                   PATIENT TYPE:  OIB   LOCATION:  2899                                 FACILITY:  [facility]   PHYSICIAN:  Genene Churn. Love, M.D.                 DATE OF BIRTH:  01/26/1946   DATE OF PROCEDURE:  03/19/2002  DATE OF DISCHARGE:                                 OPERATIVE REPORT   CLINICAL HISTORY:  This patient has had recurrent headaches and a known  prior history of subarachnoid hemorrhage.   DESCRIPTION OF PROCEDURE:  The patient was prepped and draped.  In the left  lateral decubitus position using Betadine and 1% Xylocaine, the L4-5  interspace was entered without difficulty.  Opening pressure was 180 mmH2O,  but the patient was tense.  Clear, colorless CSF was obtained and sent for  cell count, differential, protein, glucose, cryptococcal antigens, IgG and  oligoclonal IgG.  The patient tolerated the procedure well.                                               Genene Churn. Sandria Manly, M.D.    JML/MEDQ  D:  03/19/2002  T:  03/19/2002  Job:  [id number]

## 2010-07-23 NOTE — Assessment & Plan Note (Signed)
[name], [name]                        MRN:          [id number]  DATE:04/25/2006                            DOB:          1946/02/25    PULMONARY FOLLOWUP   PROBLEMS:  1. Allergic rhinitis.  2. Diabetes.  3. Fibromyalgia.  4. Cerebrovascular accident.  5. Facial pain.  6. Dysphonia/nasal congestion.  7. Anxiety.  8. Obstructive sleep apnea.  9. Dysphagia.   HISTORY:  She complains that she cannot take a comfortable deep breath.  This began when her husband began putting down a hardwood floor in their  home and she says it is very dusty, You can write your name in the dust  anywhere.  She denies head congestion and continues using her CPAP at  11 CWP.  Mainly, she has a hard, minimally productive cough, some  wheeze, and some tussive bilateral rib soreness.   MEDICATIONS:  1. Tenormin 50 mg.  2. Lipitor 20 mg.  3. Triamterine/hydrochlorothiazide 37.5/12.5.  4. CPAP 11 CWP.  5. Aspirin 81 mg.  6. Allergy vaccine continues at 1:10 without problems.  7. Cymbalta 90 mg.  8. Glucotrol 10 mg.  9. Insulin.  10.Zegerid p.r.n.  11.She has an EpiPen.  12.Astelin.  13.Trazodone.  14.Foradil.   DRUG INTOLERANCES:  CODEINE, KEFLEX, LISINOPRIL, COZAAR, CHROMIC  SUTURES.   OBJECTIVE:  Weight 152 pounds, BP 134/80, pulse 73, room air saturation  96%.  Dry cough.  Her chronic nasal speech pattern is unchanged.  I do not  find chest dullness, strider, or significant wheeze.  Heart sounds are regular without murmur.  There is no edema.   IMPRESSION:  Bronchitis secondary to dust exposure from wood floor  installation.  They are using pre-made wood panels and it does not sound  as if they are working with polyurethane sealant.   PLAN:  1. Sample Symbicort 160/4.5 two puffs b.i.d. to use up the sample with      discussion.  2. Nebulizer treatment here.  Xopenex 1.25 mg.  3. Keep  scheduled appointment, earlier p.r.n.     Clinton D. Maple Hudson, MD, Tonny Bollman, FACP  Electronically Signed    CDY/MedQ  DD: 04/25/2006  DT: 04/26/2006  Job #: [id number]   cc:   L. Lupe Carney, M.D.  James L. Malon Kindle., M.D.  Genene Churn. Love, M.D.

## 2010-07-23 NOTE — Op Note (Signed)
[name], [name]                           ACCOUNT NO.:  [id number]   MEDICAL RECORD NO.:  [id number]                   PATIENT TYPE:  AMB   LOCATION:  [facility]                                 FACILITY:  [facility]   PHYSICIAN:  Jamison Neighbor, M.D.               DATE OF BIRTH:  Oct 24, 1945   DATE OF PROCEDURE:  08/22/2003  DATE OF DISCHARGE:                                 OPERATIVE REPORT   PREOPERATIVE DIAGNOSIS:  Interstitial cystitis.   POSTOPERATIVE DIAGNOSIS:  Interstitial cystitis.   PROCEDURE:  1. Cystoscopy.  2. Urethral calibration.  3. Hydrodistention in the bladder.  4. Marcaine and Pyridium instillation.  5. Marcaine and Kenalog injection.   SURGEON:  Jamison Neighbor, M.D.   ANESTHESIA:  General.   COMPLICATIONS:  None.   DRAINS:  None.   BRIEF HISTORY:  This 65 year old female has well-documented interstitial  cystitis.  The patient responds very poorly to most forms of therapy other  than hydrodistention.  She has requested that a repeat hydrodistention be  performed.  It has been greater than 1 year since her last procedure.  She  understands the risks and benefits of the procedure and gave full informed  consent.   DESCRIPTION OF PROCEDURE:  After the successful induction of general  anesthesia, the patient was placed in the dorsal lithotomy position and  prepped with Betadine, draped in the usual sterile fashion.  No significant  cystocele or rectocele could be seen.  The patient had no mass on bimanual  exam.  The patient had normal caliber urethra with no evidence of stenosis  or stricture.  The cystoscope was inserted; the urethra was visualized and  was somewhat patulous.  The patient has noted some recent problems with  incontinence, but it is unclear whether this is stress incontinence or urge  incontinence, and it is most likely multifactorial.  Cystoscopy was  performed with the bladder distended at a pressure of 100 cm of water for 5  minutes.   When the bladder was drained, the patient had bladder capacity of  800 mL.  There was a terminal blood tinge.  There was modest glomerulation,  but this was actually better than it has looked in the past.  The patient  does not require a biopsy.  A mixture of Marcaine and  Pyridium was left within the bladder.  Marcaine and Kenalog were injected  periurethrally.  The patient tolerated the procedure well and was taken to  the recovery room in good condition.  She will be sent home with Lorcet 10,  Pyridium Plus, and Tequin and return to see me in 2-3 weeks.  Jamison Neighbor, M.D.    RJE/MEDQ  D:  08/22/2003  T:  08/22/2003  Job:  [id number]   cc:   L. Lupe Carney, M.D.  [address] [place]  [place] [address]  Fax: [phone]

## 2010-07-23 NOTE — Procedures (Signed)
[facility]  Patient:    [name], [name]                     MRN: [id number] Adm. Date:  [id number] Attending:  Orland Mustard CC:         Abran Cantor. Clovis Riley, M.D.  Jamison Neighbor, M.D.  Helene Kelp, M.D.   Procedure Report  PROCEDURE:  Esophagogastroduodenoscopy.  ENDOSCOPIST:  Llana Aliment. Edwards, M.D.  MEDICATIONS:  Hurricaine spray; Fentanyl 110 mcg and Versed 11 mg IV.  INDICATION:  Persistent abdominal pain of unclear etiology.  Patient has had a history of ulcer and has been on Prevacid daily for reflux.  Symptoms have continued.  She is also on a multitude of other medicines including Donnatal, Neurontin, amitriptyline, Klonopin, Xanax, Prozac.  DESCRIPTION OF PROCEDURE:  Procedure had been explained to the patient and consent obtained.  With patient in the left lateral decubitus position, the Olympus videoendoscope was inserted blindly into the esophagus and advanced under direct visualization.  The stomach was entered and immediately, the patient was seen to have a large amount of retained stool.  The gastric outlet was identified and passed.  We went way down to the second duodenum, which was normal.  The scope was withdrawn back into the stomach.  The gastric outlet was completely normal.  I was only able to see the very small part of the lesser curve.  There was a small shallow ulcer right along the lesser curve. The fundus and cardia were seen in the retroflexed view and appeared normal. The distal and proximal esophagus were seen well and were normal.  The scope was withdrawn.  Patient tolerated the procedure well and was maintained on low-flow oxygen and pulse oximeter throughout the procedure with no obvious problem.  ASSESSMENT 1. Probable gastroparesis due to the multiple medications that she is taking. 2. A small shallow gastric ulcer, probably due to nonsteroidals.  PLAN 1. Will go ahead and stop her Donnatal and  will add Reglan. 2. Will probably plan an upper GI and small-bowel follow-through to make sure    the small ulcer has not progressed in the future. 3. We will see back in the office in four to six weeks. DD:  12/16/99 TD:  12/17/99 Job: [id number]

## 2010-07-23 NOTE — Op Note (Signed)
   [name], [name]                           ACCOUNT NO.:  [id number]   MEDICAL RECORD NO.:  [id number]                   PATIENT TYPE:  AMB   LOCATION:  ENDO                                 FACILITY:  [facility]   PHYSICIAN:  James L. Malon Kindle., M.D.          DATE OF BIRTH:  05/03/1945   DATE OF PROCEDURE:  12/06/2001  DATE OF DISCHARGE:                                 OPERATIVE REPORT   PROCEDURE:  Esophagogastroduodenoscopy.   MEDICATIONS:  Cetacaine spray, Fentanyl 100 mcg and Versed 10 mg IV.   INDICATIONS:  Persistent dyspepsia in a woman who has had previous  operations for lysis of adhesions.  We are performing an endoscopy just to  be sure there is not an active ulcer or gastric outlet obstruction.   DESCRIPTION OF PROCEDURE:  The procedure was explained to the patient and  consent obtained.  With the patient in the left lateral decubitus position,  the Olympus scope was inserted and advanced under direct visualization.  The  stomach was entered.  The pylorus was identified and passed.  The duodenum  including the bulb and second portion was seen well and was unremarkable.  The scope was withdrawn.  The duodenal bulb and antrum were completely  normal.  The fundus and cardia were seen and all were normal.  The GE  junction was widely patent.  The distal esophagus was quite well red and was  not ulcerated.  The proximal esophagus was normal.  The scope was withdrawn.  The patient tolerated the procedure well.   ASSESSMENT:  Widely patent GE junction with probable gastroesophageal reflux  disease.   PLAN:  Will continue the patient on Nexium and Reglan.  See back in the  office in three to four months.                                               James L. Malon Kindle., M.D.    Waldron Session  D:  12/06/2001  T:  12/07/2001  Job:  [id number]   cc:   L. Lupe Carney, M.D.   Jamison Neighbor, M.D.   Areatha Keas, M.D.  [address]  [address]  [place]  [place] [address]  Fax: [phone]   Mardene Celeste. Lurene Shadow, M.D.  [address]  [place]  [place] [address]  Fax: [phone]   Clinton D. Young, M.D.  [address] [place]  [place] [address]  Fax: [phone]   Llana Aliment. Malon Kindle., M.D.  [address]  [place]  [place] [address]  Fax: [phone]

## 2010-10-20 ENCOUNTER — Ambulatory Visit (HOSPITAL_COMMUNITY)
Admission: RE | Admit: 2010-10-20 | Discharge: 2010-10-20 | Disposition: A | Discharge status: Home / Self Care | Payer: Medicare Other | Source: Ambulatory Visit | Attending: Orthopedic Surgery | Admitting: Orthopedic Surgery

## 2010-10-20 ENCOUNTER — Other Ambulatory Visit: Payer: Self-pay | Admitting: Orthopedic Surgery

## 2010-10-20 ENCOUNTER — Encounter (HOSPITAL_COMMUNITY): Payer: Medicare Other

## 2010-10-20 ENCOUNTER — Other Ambulatory Visit (HOSPITAL_COMMUNITY): Payer: Self-pay | Admitting: Orthopedic Surgery

## 2010-10-20 DIAGNOSIS — T84029A Dislocation of unspecified internal joint prosthesis, initial encounter: Secondary | ICD-10-CM | POA: Insufficient documentation

## 2010-10-20 DIAGNOSIS — Z01818 Encounter for other preprocedural examination: Secondary | ICD-10-CM

## 2010-10-20 DIAGNOSIS — X58XXXA Exposure to other specified factors, initial encounter: Secondary | ICD-10-CM | POA: Insufficient documentation

## 2010-10-20 DIAGNOSIS — Z01811 Encounter for preprocedural respiratory examination: Secondary | ICD-10-CM | POA: Insufficient documentation

## 2010-10-20 DIAGNOSIS — Z950 Presence of cardiac pacemaker: Secondary | ICD-10-CM | POA: Insufficient documentation

## 2010-10-20 DIAGNOSIS — Z01812 Encounter for preprocedural laboratory examination: Secondary | ICD-10-CM | POA: Insufficient documentation

## 2010-10-20 DIAGNOSIS — Z79899 Other long term (current) drug therapy: Secondary | ICD-10-CM | POA: Insufficient documentation

## 2010-10-20 DIAGNOSIS — Z96659 Presence of unspecified artificial knee joint: Secondary | ICD-10-CM | POA: Insufficient documentation

## 2010-10-20 DIAGNOSIS — I10 Essential (primary) hypertension: Secondary | ICD-10-CM | POA: Insufficient documentation

## 2010-10-20 LAB — COMPREHENSIVE METABOLIC PANEL
ALT: 21 U/L (ref 0–35)
Albumin: 4 g/dL (ref 3.5–5.2)
Alkaline Phosphatase: 95 U/L (ref 39–117)
BUN: 18 mg/dL (ref 6–23)
Chloride: 98 mEq/L (ref 96–112)
Potassium: 4.8 mEq/L (ref 3.5–5.1)
Sodium: 135 mEq/L (ref 135–145)
Total Bilirubin: 0.5 mg/dL (ref 0.3–1.2)

## 2010-10-20 LAB — CBC
HCT: 40.3 % (ref 36.0–46.0)
Hemoglobin: 13.8 g/dL (ref 12.0–15.0)
MCH: 28.5 pg (ref 26.0–34.0)
MCHC: 34.2 g/dL (ref 30.0–36.0)

## 2010-10-20 LAB — URINALYSIS, ROUTINE W REFLEX MICROSCOPIC
Bilirubin Urine: NEGATIVE
Glucose, UA: NEGATIVE mg/dL
Hgb urine dipstick: NEGATIVE
Specific Gravity, Urine: 1.023 (ref 1.005–1.030)
pH: 5.5 (ref 5.0–8.0)

## 2010-10-20 LAB — PROTIME-INR: INR: 1.02 (ref 0.00–1.49)

## 2010-10-20 LAB — SURGICAL PCR SCREEN: MRSA, PCR: NEGATIVE

## 2010-10-27 ENCOUNTER — Inpatient Hospital Stay (HOSPITAL_COMMUNITY)
Admission: RE | Admit: 2010-10-27 | Discharge: 2010-10-30 | DRG: 488 | Disposition: A | Discharge status: Home Health | Payer: Medicare Other | Source: Ambulatory Visit | Attending: Orthopedic Surgery | Admitting: Orthopedic Surgery

## 2010-10-27 DIAGNOSIS — T84029A Dislocation of unspecified internal joint prosthesis, initial encounter: Principal | ICD-10-CM | POA: Diagnosis present

## 2010-10-27 DIAGNOSIS — K449 Diaphragmatic hernia without obstruction or gangrene: Secondary | ICD-10-CM | POA: Diagnosis present

## 2010-10-27 DIAGNOSIS — G4733 Obstructive sleep apnea (adult) (pediatric): Secondary | ICD-10-CM | POA: Diagnosis present

## 2010-10-27 DIAGNOSIS — I10 Essential (primary) hypertension: Secondary | ICD-10-CM | POA: Diagnosis present

## 2010-10-27 DIAGNOSIS — Z96659 Presence of unspecified artificial knee joint: Secondary | ICD-10-CM

## 2010-10-27 DIAGNOSIS — E669 Obesity, unspecified: Secondary | ICD-10-CM | POA: Diagnosis present

## 2010-10-27 DIAGNOSIS — E119 Type 2 diabetes mellitus without complications: Secondary | ICD-10-CM | POA: Diagnosis present

## 2010-10-27 DIAGNOSIS — Z01812 Encounter for preprocedural laboratory examination: Secondary | ICD-10-CM

## 2010-10-27 DIAGNOSIS — F411 Generalized anxiety disorder: Secondary | ICD-10-CM | POA: Diagnosis not present

## 2010-10-27 DIAGNOSIS — E871 Hypo-osmolality and hyponatremia: Secondary | ICD-10-CM | POA: Diagnosis not present

## 2010-10-27 DIAGNOSIS — K219 Gastro-esophageal reflux disease without esophagitis: Secondary | ICD-10-CM | POA: Diagnosis present

## 2010-10-27 LAB — GLUCOSE, CAPILLARY
Glucose-Capillary: 129 mg/dL — ABNORMAL HIGH (ref 70–99)
Glucose-Capillary: 141 mg/dL — ABNORMAL HIGH (ref 70–99)
Glucose-Capillary: 342 mg/dL — ABNORMAL HIGH (ref 70–99)
Glucose-Capillary: 328 mg/dL — ABNORMAL HIGH (ref 70–99)

## 2010-10-27 LAB — TYPE AND SCREEN
ABO/RH(D): O NEG
Antibody Screen: NEGATIVE

## 2010-10-28 LAB — GLUCOSE, CAPILLARY
Glucose-Capillary: 266 mg/dL — ABNORMAL HIGH (ref 70–99)
Glucose-Capillary: 292 mg/dL — ABNORMAL HIGH (ref 70–99)

## 2010-10-28 LAB — BASIC METABOLIC PANEL
Chloride: 94 mEq/L — ABNORMAL LOW (ref 96–112)
Creatinine, Ser: 0.71 mg/dL (ref 0.50–1.10)
GFR calc Af Amer: >60 mL/min (ref >60)
Sodium: 132 mEq/L — ABNORMAL LOW (ref 135–145)

## 2010-10-28 LAB — CBC
MCV: 81.7 fL (ref 78.0–100.0)
Platelets: 206 10*3/uL (ref 150–400)
RBC: 4.32 MIL/uL (ref 3.87–5.11)
RDW: 13.3 % (ref 11.5–15.5)
WBC: 14.9 10*3/uL — ABNORMAL HIGH (ref 4.0–10.5)

## 2010-10-28 LAB — PROTIME-INR
INR: 1.11 (ref 0.00–1.49)
Prothrombin Time: 14.5 seconds (ref 11.6–15.2)

## 2010-10-29 LAB — CBC
MCH: 28.7 pg (ref 26.0–34.0)
MCHC: 34.8 g/dL (ref 30.0–36.0)
Platelets: 185 10*3/uL (ref 150–400)
RBC: 4.01 MIL/uL (ref 3.87–5.11)

## 2010-10-29 LAB — BASIC METABOLIC PANEL
BUN: 15 mg/dL (ref 6–23)
Creatinine, Ser: 0.68 mg/dL (ref 0.50–1.10)
GFR calc Af Amer: >60 mL/min (ref >60)
GFR calc non Af Amer: >60 mL/min (ref >60)
Potassium: 3.7 mEq/L (ref 3.5–5.1)

## 2010-10-29 LAB — PROTIME-INR
INR: 1.6 — ABNORMAL HIGH (ref 0.00–1.49)
Prothrombin Time: 19.3 seconds — ABNORMAL HIGH (ref 11.6–15.2)

## 2010-10-30 LAB — CBC
Hemoglobin: 12 g/dL (ref 12.0–15.0)
RBC: 4.31 MIL/uL (ref 3.87–5.11)

## 2010-10-30 LAB — PROTIME-INR
INR: 1.66 — ABNORMAL HIGH (ref 0.00–1.49)
Prothrombin Time: 19.9 seconds — ABNORMAL HIGH (ref 11.6–15.2)

## 2010-10-30 LAB — GLUCOSE, CAPILLARY

## 2010-11-04 NOTE — H&P (Signed)
[name], [name]                 ACCOUNT NO.:  [id number]  MEDICAL RECORD NO.:  [id number]  LOCATION:                                 FACILITY:  PHYSICIAN:  Ollen Gross, M.D.    DATE OF BIRTH:  1945-03-11  DATE OF ADMISSION:  10/27/2010 DATE OF DISCHARGE:                             HISTORY & PHYSICAL   CHIEF COMPLAINT:  Right knee pain.  HISTORY OF PRESENT ILLNESS:  Patient is a 65 year old female, who has been seen in consultation by Dr. Lequita Halt for ongoing right knee pain. She reached out to primary.  Total knee done by Dr. Kenna Gilbert in [place] around 2002, started to have problems then around about 2008, and was noted to have a loosening of the component and had a revision of the knee done by Dr. Kenna Gilbert back in 2010.  She had a lot of trouble following this since she developed the postop pulmonary embolus and knee got quite stiff and she had 2 more procedures to remove scar tissue.  She eventually developed decent motion, but now has developed further problems where she is having a frequent giving out. She has been seen by Dr. Lequita Halt and has had bone scan and films have been reviewed.  She has developed some laxity and some instability with the knee and it is felt that she is probably getting a lot of synovitis and mechanical popping and catching, and it is irritating the tissues. It is discussed at length with the patient of the different findings and options and is felt that she would best be served by undergoing revision surgery.  Risks and benefits of this procedure have been discussed with the patient and she likes to proceed with surgery.  Due to her severe functional limitations, it is felt that she would definitely benefit from undergoing the procedure and there is no active contraindications to surgery such as infection or any type of rapidly progressive neurological disease.  ALLERGIES: 1. KEFLEX causes some mild swelling. 2. LEVAQUIN AND  DOXYCYCLINE cause nausea and vomiting. 3. Sensitive to LISINOPRIL and COZAAR with drop in blood pressure. 4. METFORMIN, ZOCOR, and AVANDIA cause muscle aches.  CURRENT MEDICATIONS:  Glucotrol, Tenormin, amlodipine, baby aspirin, Levemir, Xanax, fish oil, Lipitor, trazodone, Ocuvite, omeprazole.  PAST MEDICAL HISTORY: 1. Diabetes mellitus. 2. Hypercholesterolemia. 3. Interstitial cystitis. 4. Diverticulosis. 5. Hiatal hernia. 6. Reflux disease. 7. Irritable bowel syndrome. 8. Fibromyalgia. 9. Sleep apnea, uses CPAP. 10.History of subarachnoid hemorrhage in 2001. 11.Complete heart block, requiring pacemaker. 12.Coronary arterial disease with diffuse vessel disease in the LAD. 13.History of deep vein thrombosis and pulmonary embolism following     previous revision total knee. 14.History of vertigo. 15.Anxiety. 16.Macular degeneration. 17.History of depression. 18.History of asthma. 19.History of bronchitis. 20.Childhood rheumatic fever. 21.Urinary incontinence. 22.Hypertension  PAST SURGICAL HISTORY: 1. Hysterectomy with left-sided oophorectomy in 1974, then a right-     sided oophorectomy in 1991. 2. Nasal surgery in 1982. 3. Cholecystectomy in 1984. 4. TMJ surgery. 5. Cataract surgery bilaterally in 2003. 6. Pacemaker implant in November 2008. 7. Right carpal tunnel surgery in 2009. 8. She had a right total knee replacement originally around 2002 and  then a right total knee revision in 2010, and she has undergone 2     arthroscopies with scar excision, 1 in March 2011, and again in     December 2011.  FAMILY HISTORY:  Father deceased with cancer.  Mother living with heart disease, blood pressure, and peripheral vascular disease.  SOCIAL HISTORY:  Married, retired, nonsmoker.  No alcohol.  Does have a caregiver lined up, has a living will healthcare power of attorney.  REVIEW OF SYSTEMS:  GENERAL:  No fevers, chills, or night sweats. NEURO:  She gets a little  bit of dizziness and headaches.  No seizures or syncope.  Does have a history of subarachnoid hemorrhage. RESPIRATORY:  No shortness breath, productive cough, or hemoptysis. CARDIOVASCULAR:  No chest pain, angina, or orthopnea.  GI:  She does have some nausea and constipation.  No vomiting or diarrhea.  GU: Little bit of frequency, nocturia, and some incontinence. MUSCULOSKELETAL:  Knee pain.  PHYSICAL EXAMINATION:  VITAL SIGNS:  Pulse 68, respirations 14, blood pressure 148/58. GENERAL:  A 65 year old white female, well nourished, well developed, mild distress, anxious.  She is alert, oriented, and cooperative, accompanied by her husband.  HEENT:  Normocephalic, atraumatic.  Pupils are round and reactive.  EOMs intact.  Noted to wear glasses. NECK:  Supple.  She does have a left carotid bruit, which is faint. CHEST:  Clear anterior and posterior chest walls.  No rhonchi, rales, or wheezing. HEART:  Regular rhythm with a faint systolic ejection murmur. ABDOMEN:  Soft, slightly round.  Bowel sounds present. RECTAL, BREAST, GENITALIA:  Not done, not part of present illness. EXTREMITIES:  Right knee, previous anterior incision is healed.  Range of motion is 0 to 15.  She has slight varus and valgus laxity, moderate AP laxity at 90 degrees of flexion.  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  PLAN:  Patient is going to [facility], undergo revision right total knee arthroplasty.  Surgery will be performed by Dr. Ollen Gross.     [name], P.A.C.   ______________________________ Ollen Gross, M.D.    ALP/MEDQ  D:  10/26/2010  T:  10/27/2010  Job:  [id number]  cc:   L. Lupe Carney, M.D. Fax: [phone]  Lyn Records, M.D. Fax: [phone]  Electronically Signed by Patrica Duel P.A.C. on 10/28/2010 09:20:02 AM Electronically Signed by Ollen Gross M.D. on 11/04/2010 04:04:36 PM

## 2010-11-04 NOTE — Op Note (Signed)
NAMESILVIA, [name]                 ACCOUNT NO.:  [id number]  MEDICAL RECORD NO.:  [id number]  LOCATION:  1608                         FACILITY:  [facility]  PHYSICIAN:  Ollen Gross, M.D.    DATE OF BIRTH:  June 16, 1945  DATE OF PROCEDURE: DATE OF DISCHARGE:                              OPERATIVE REPORT   PREOPERATIVE DIAGNOSIS:  Unstable right total knee arthroplasty.  POSTOPERATIVE DIAGNOSIS:  Unstable right total knee arthroplasty.  PROCEDURE:  Right knee tibial polyethylene revision.  SURGEON:  Ollen Gross, M.D.  ASSISTANT:  Alexzandrew L. Perkins, PAC  ANESTHESIA:  General.  ESTIMATED BLOOD LOSS:  Minimal.  DRAINS:  Hemovac x1.  TOURNIQUET TIME:  Twenty eight minutes at 300 mmHg.  COMPLICATIONS:  None.  CONDITION:  Stable to the recovery room.  BRIEF CLINICAL NOTE:  [name] is a 65 year old female with long complex history in regard to her right knee.  She had a knee revision done a couple of years ago by Dr. Kenna Gilbert.  She has had worsening discomfort and instability in the knee.  Evaluation showed the components in good position, apparently well fixed.  She does have laxity on exam.  She has persistent worsening symptoms and we discussed that.  We did contemplate revising polyethylene to a thicker one to gain more stability versus changing the components to see if they were loose intraoperatively.  She felt that it was necessary to pursue surgical treatment, given that she was not improving.  She presents now for tibial polyethylene versus total knee arthroplasty revision.  PROCEDURE IN DETAIL:  After successful administration of general anesthetic, a tourniquet was placed high on her right thigh and her right lower extremity was prepped and draped in the usual sterile fashion.  Extremities were wrapped in Esmarch, knee flexed, and tourniquet inflated to 300 mmHg.  Midline incision was made with a #10 blade through the subcutaneous tissue to the extensor  mechanism.  Fresh blade was used to make a medial parapatellar arthrotomy.  I did not encounter any fluid in the joint.  There is some scar tissue, which is excised.  Soft tissue on the proximal medial tibia subperiosteally elevated to the joint line with a knife and into the semimembranosus bursa with a Cobb elevator.  Soft tissue laterally is elevated with attention being paid to avoid patellar tendon and tibial tubercle.  I was easily able to sublux the tibia forward, essentially dislocating and then I removed the tibial polyethylene.  We sized a 2.5 rotating platform, 12.5 thickness polyethylene.  We then thoroughly inspected tibial and femoral components looking at the interface circumferentially.  There was no evidence of loosening on either side. I applied bone tamp to both components and they did not move.  The components were felt to be stable.  On x-ray, they were in excellent position intraoperatively with equal flexion and extension gaps.  We trialed the new tibial polyethylene up to a 17.5.  Used the TC3, which is a more constrained insert.  With a 17.5, full extension still achieved with excellent varus-valgus and anterior-posterior stability throughout full range of motion.  Note that on her exam under anesthesia, there was about 5 mm  of varus-valgus play in full extension and about 5-10 mm of AP play in 90 degrees of flexion.  This was all eliminated with a thicker polyethylene with a straight curette.  We then removed the trial and placed the permanent 17.5 mm TC3 rotating platform insert into the tibial tray.  The knee was reduced and again full extension was achieved with no laxity at all in extension or flexion. Wound was then copiously irrigated with saline solution.  The patellar tendon had peeled about 30% and I placed a Mitek anchor to reattach that.  Note that 70% of the tendon did not peel off the tubercle.  This was repaired at 30%.  The anchor was placed into  the tibia and then sutures were passed through that area and sewn back down to the tubercle.  The arthrotomy was then closed with interrupted #1 PDS. Flexion against gravity to 125 degrees, patella tracks normally and the knee is very stable throughout range of motion.  Tourniquet released total time was 28 minutes.  Subcu was closed with interrupted 2-0 Vicryl and skin with staples.  Catheter for Marcaine pain pump was placed and pumps were initiated.  Incisions were cleaned and dried, and a bulky sterile dressing applied.  She was then placed into a knee immobilizer, awakened and transferred to recovery in stable condition.     Ollen Gross, M.D.     FA/MEDQ  D:  10/27/2010  T:  10/27/2010  Job:  [id number]  Electronically Signed by Ollen Gross M.D. on 11/04/2010 04:04:39 PM

## 2010-11-05 LAB — GLUCOSE, CAPILLARY
Glucose-Capillary: 114 mg/dL — ABNORMAL HIGH (ref 70–99)
Glucose-Capillary: 202 mg/dL — ABNORMAL HIGH (ref 70–99)

## 2010-11-24 NOTE — Discharge Summary (Signed)
[name], [name]                 ACCOUNT NO.:  [id number]  MEDICAL RECORD NO.:  [id number]  LOCATION:  1608                         FACILITY:  [facility]  PHYSICIAN:  Ollen Gross, M.D.    DATE OF BIRTH:  03-12-1945  DATE OF ADMISSION:  10/27/2010 DATE OF DISCHARGE:  10/30/2010                              DISCHARGE SUMMARY   ADMITTING DIAGNOSES: 1. 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 2. Diabetes mellitus. 3. Hypercholesterolemia. 4. Interstitial cystitis. 5. Diverticulosis. 6. Hiatal hernia. 7. Reflux disease. 8. Irritable bowel syndrome. 9. Fibromyalgia. 10.Sleep apnea. 11.History of subarachnoid hemorrhage in 2001. 12.Complete heart block requiring pacemaker. 13.Coronary arterial disease with diffuse vessel disease in the left     anterior descending coronary artery. 14.Past history of deep vein thrombosis and pulmonary embolism     following previous revision total knee. 15.History of a vertigo. 16.Anxiety. 17.Macular degeneration. 18.History of depression. 19.History of asthma. 20.History of bronchitis. 21.Childhood rheumatic fever. 22.Urinary incontinence. 23.Hypertension.  DISCHARGE DIAGNOSES: 1. Unstable right total knee arthroplasty, status post right knee     tibial polyethylene revision. 2. Postoperative altered mental status/confusion. 3. Postoperative hyponatremia, improved. 4. 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 5. Diabetes mellitus. 6. Hypercholesterolemia. 7. Interstitial cystitis. 8. Diverticulosis. 9. Hiatal hernia. 10.Reflux disease. 11.Irritable bowel syndrome. 12.Fibromyalgia. 13.Sleep apnea. 14.History of subarachnoid hemorrhage in 2001. 15.Complete heart block requiring pacemaker. 16.Coronary arterial disease with diffuse vessel disease in the left     anterior descending coronary artery. 17.Past history of deep vein thrombosis and pulmonary embolism     following previous revision total  knee. 18.History of a vertigo. 19.Anxiety. 20.Macular degeneration. 21.History of depression. 22.History of asthma. 23.History of bronchitis. 24.Childhood rheumatic fever. 25.Urinary incontinence. 26.Hypertension.  PROCEDURE:  On October 27, 2010, right knee tibial polyethylene revision.  SURGEON:  Ollen Gross, M.D.  ASSISTANT:  Alexzandrew L. Perkins, P.A.C.  ANESTHESIA:  General.  TOURNIQUET TIME:  28 minutes.  CONSULTS:  None.  BRIEF HISTORY:  [name] is a 64 year old female with long complex history in regard to her right knee.  She had a knee revision done a couple of years ago by Dr. Kenna Gilbert.  She had increased and worsening discomfort instability.  Evaluation shows components in good position, fairly well-fixed, does have some laxity on exam.  She has persistent worsening symptoms contemplate revising the polyethylene to a thicker poly versus full-bone revision.  Risks and benefits have been discussed and subsequently admitted in the hospital.  LABORATORY DATA:  The admission CBC on exam was not scanned and the chart not available, but serial CBCs were followed for 3 days. Hemoglobin was 12.3 on postop day #1, then down to 11.5, back up to 12, with a hematocrit of 35.5 at discharge.  Serial pro times followed per Coumadin protocol.  Lastly, PT/INR 19.9 and 1.66.  Chem panel on admission not scanned and the chart not available, but serial BMETs were followed for 48 hours.  Sodium was low on postop day #1 of 132, came up to 135; chloride was low at 94, came up to 99; glucose was elevated on day #1 and day #2, of 263 and 271 respectively.  Blood group type  O negative.  HOSPITAL COURSE:  The patient at [facility], taken to OR, underwent above-stated procedure without complication.  The patient tolerated the procedure well, later was transferred to recovery room on orthopedic floor, started on p.o. and IV analgesic pain control following surgery,  doing fairly.  On the morning of day #1, she wanted to go home after her hospital stay, so we got the discharge planning involved.  She had excellent urinary output.  Sodium was a little low on day #1, so back down her fluids.  She was started on Coumadin with a Lovenox bridge due to her past history of DVT and PE.  She was started back on her home meds.  By day #2, dressing was changed.  She had a little bit of some confusion, so we decreased her narcotics and discontinued the IV meds, and let the medications wear off.  She started getting therapy a little bit more.  We changed her dressing, incision looked good.  No signs of infection and slowly progressed, and by postop day #3, she is doing a little bit better.  She was progressing with her therapy and she was discharged home on August 25, once she had met her therapy goals, the postop confusion had resolved, and she was tolerating her meds.  DISCHARGE/PLAN: 1. The patient discharged home on October 30, 2010. 2. Discharge diagnoses, please see above. 3. Discharge meds:  Lovenox for 2 more days; Robaxin; OxyIR, reduced     dose; and Coumadin.  Continue home meds of amlodipine, Cymbalta,     Glucotrol XL, Levemir, Lipitor, Ocuvite, omeprazole, Tenormin,     trazodone, and Xanax.  DIET:  Heart-healthy diet.  ACTIVITY:  She is weightbearing as tolerated, total knee protocol.  FOLLOWUP:  2 weeks.  DISPOSITION:  Home.  CONDITION ON DISCHARGE:  Improved.     Alexzandrew L. Julien Girt, P.A.C.   ______________________________ Ollen Gross, M.D.    ALP/MEDQ  D:  11/11/2010  T:  11/11/2010  Job:  [id number]  Electronically Signed by Patrica Duel P.A.C. on 11/18/2010 10:49:30 AM Electronically Signed by Ollen Gross M.D. on 11/24/2010 10:07:57 AM

## 2010-11-26 LAB — BASIC METABOLIC PANEL
CO2: 30
Chloride: 92 — ABNORMAL LOW
Potassium: 4.4
Sodium: 129 — ABNORMAL LOW

## 2010-11-26 LAB — POCT HEMOGLOBIN-HEMACUE: Hemoglobin: 13.2

## 2010-12-02 ENCOUNTER — Emergency Department (HOSPITAL_COMMUNITY)
Admission: EM | Admit: 2010-12-02 | Discharge: 2010-12-02 | Disposition: A | Discharge status: Home / Self Care | Payer: Medicare Other | Attending: Emergency Medicine | Admitting: Emergency Medicine

## 2010-12-02 DIAGNOSIS — R5381 Other malaise: Secondary | ICD-10-CM | POA: Insufficient documentation

## 2010-12-02 DIAGNOSIS — Z96659 Presence of unspecified artificial knee joint: Secondary | ICD-10-CM | POA: Insufficient documentation

## 2010-12-02 DIAGNOSIS — I252 Old myocardial infarction: Secondary | ICD-10-CM | POA: Insufficient documentation

## 2010-12-02 DIAGNOSIS — R5383 Other fatigue: Secondary | ICD-10-CM | POA: Insufficient documentation

## 2010-12-02 DIAGNOSIS — R21 Rash and other nonspecific skin eruption: Secondary | ICD-10-CM | POA: Insufficient documentation

## 2010-12-02 DIAGNOSIS — E119 Type 2 diabetes mellitus without complications: Secondary | ICD-10-CM | POA: Insufficient documentation

## 2010-12-02 LAB — CBC
Hemoglobin: 12.7 g/dL (ref 12.0–15.0)
MCV: 82.2 fL (ref 78.0–100.0)
Platelets: 278 10*3/uL (ref 150–400)
RBC: 4.65 MIL/uL (ref 3.87–5.11)
WBC: 12 10*3/uL — ABNORMAL HIGH (ref 4.0–10.5)

## 2010-12-02 LAB — COMPREHENSIVE METABOLIC PANEL
ALT: 17 U/L (ref 0–35)
AST: 21 U/L (ref 0–37)
CO2: 26 mEq/L (ref 19–32)
Chloride: 97 mEq/L (ref 96–112)
GFR calc Af Amer: >60 mL/min (ref >60)
GFR calc non Af Amer: >60 mL/min (ref >60)
Glucose, Bld: 167 mg/dL — ABNORMAL HIGH (ref 70–99)
Sodium: 134 mEq/L — ABNORMAL LOW (ref 135–145)
Total Bilirubin: 0.5 mg/dL (ref 0.3–1.2)

## 2010-12-02 LAB — URINE MICROSCOPIC-ADD ON

## 2010-12-02 LAB — URINALYSIS, ROUTINE W REFLEX MICROSCOPIC
Glucose, UA: NEGATIVE mg/dL
Ketones, ur: NEGATIVE mg/dL
Leukocytes, UA: NEGATIVE
Nitrite: NEGATIVE
pH: 6.5 (ref 5.0–8.0)

## 2010-12-02 LAB — DIFFERENTIAL
Basophils Absolute: 0 10*3/uL (ref 0.0–0.1)
Eosinophils Relative: 0 % (ref 0–5)
Lymphocytes Relative: 23 % (ref 12–46)
Monocytes Absolute: 0.8 10*3/uL (ref 0.1–1.0)
Monocytes Relative: 7 % (ref 3–12)
Neutro Abs: 8.4 10*3/uL — ABNORMAL HIGH (ref 1.7–7.7)

## 2010-12-05 LAB — URINE CULTURE

## 2010-12-10 LAB — BASIC METABOLIC PANEL
Calcium: 9.6
GFR calc Af Amer: >60
GFR calc non Af Amer: >60
Sodium: 133 — ABNORMAL LOW

## 2010-12-10 LAB — HEMOGLOBIN AND HEMATOCRIT, BLOOD
HCT: 37.2
Hemoglobin: 13

## 2010-12-10 LAB — URINALYSIS, ROUTINE W REFLEX MICROSCOPIC
Glucose, UA: 250 — AB
Ketones, ur: NEGATIVE

## 2010-12-14 LAB — DIFFERENTIAL
Basophils Relative: 1
Eosinophils Absolute: 0.2
Lymphs Abs: 2.9
Monocytes Absolute: 0.7
Monocytes Relative: 7
Neutro Abs: 5.4
Neutrophils Relative %: 58

## 2010-12-14 LAB — CBC
HCT: 37.1
Hemoglobin: 12.9
Hemoglobin: 12.9
MCHC: 33.8
MCV: 83.7
MCV: 84.2
Platelets: 184
Platelets: 213
RBC: 4.39
RBC: 4.44
RBC: 4.52
WBC: 7.5
WBC: 9.2
WBC: 9.3

## 2010-12-14 LAB — BASIC METABOLIC PANEL
BUN: 15
Calcium: 9
Creatinine, Ser: 0.87
GFR calc Af Amer: >60
GFR calc non Af Amer: >60
GFR calc non Af Amer: >60
Glucose, Bld: 195 — ABNORMAL HIGH
Potassium: 3.8
Potassium: 3.9
Sodium: 134 — ABNORMAL LOW

## 2010-12-14 LAB — PROTIME-INR: Prothrombin Time: 13.2

## 2010-12-14 LAB — POCT CARDIAC MARKERS
CKMB, poc: <1 — ABNORMAL LOW
Myoglobin, poc: 96
Troponin i, poc: <0.05

## 2010-12-14 LAB — LIPID PANEL
Cholesterol: 125
HDL: 39 — ABNORMAL LOW
LDL Cholesterol: 50
Total CHOL/HDL Ratio: 3.2
Triglycerides: 179 — ABNORMAL HIGH
VLDL: 36

## 2010-12-14 LAB — I-STAT 8, (EC8 V) (CONVERTED LAB)
Acid-Base Excess: 3 — ABNORMAL HIGH
Bicarbonate: 28.3 — ABNORMAL HIGH
Chloride: 94 — ABNORMAL LOW
HCT: 41
Operator id: 265201
TCO2: 30
pCO2, Ven: 44.3 — ABNORMAL LOW
pH, Ven: 7.414 — ABNORMAL HIGH

## 2010-12-14 LAB — POCT I-STAT CREATININE
Creatinine, Ser: 0.8
Operator id: 265201

## 2010-12-14 LAB — APTT: aPTT: 29

## 2010-12-14 LAB — HEPARIN LEVEL (UNFRACTIONATED)
Heparin Unfractionated: 0.91 — ABNORMAL HIGH
Heparin Unfractionated: <0.1 — ABNORMAL LOW

## 2010-12-14 LAB — CARDIAC PANEL(CRET KIN+CKTOT+MB+TROPI): Relative Index: 1.3

## 2010-12-16 LAB — POCT HEMOGLOBIN-HEMACUE
Hemoglobin: 13
Operator id: 12362

## 2010-12-16 LAB — BASIC METABOLIC PANEL
GFR calc Af Amer: >60
GFR calc non Af Amer: >60
Potassium: 4.2
Sodium: 132 — ABNORMAL LOW

## 2010-12-17 LAB — COMPREHENSIVE METABOLIC PANEL
ALT: 31
Albumin: 4.7
Alkaline Phosphatase: 97
Glucose, Bld: 157 — ABNORMAL HIGH
Potassium: 3.9
Sodium: 120 — ABNORMAL LOW
Total Protein: 7.7

## 2010-12-17 LAB — DIFFERENTIAL
Basophils Relative: 1
Eosinophils Absolute: 0.1
Eosinophils Relative: 1
Monocytes Absolute: 0.9 — ABNORMAL HIGH
Monocytes Relative: 9

## 2010-12-17 LAB — BASIC METABOLIC PANEL
CO2: 26
Calcium: 9.4
Creatinine, Ser: 0.6
Glucose, Bld: 152 — ABNORMAL HIGH

## 2010-12-17 LAB — URINALYSIS, ROUTINE W REFLEX MICROSCOPIC
Bilirubin Urine: NEGATIVE
Ketones, ur: NEGATIVE
Nitrite: NEGATIVE
Protein, ur: NEGATIVE
Urobilinogen, UA: 0.2
pH: 7

## 2010-12-17 LAB — HEMOGLOBIN A1C
Hgb A1c MFr Bld: 8 — ABNORMAL HIGH
Mean Plasma Glucose: 208

## 2010-12-17 LAB — CBC
Hemoglobin: 14.4
Platelets: 289
RDW: 13.2

## 2010-12-17 LAB — URINE CULTURE

## 2010-12-21 ENCOUNTER — Ambulatory Visit (INDEPENDENT_AMBULATORY_CARE_PROVIDER_SITE_OTHER): Payer: Medicare Other | Admitting: Internal Medicine

## 2010-12-21 ENCOUNTER — Encounter: Payer: Self-pay | Admitting: Internal Medicine

## 2010-12-21 VITALS — BP 130/60 | HR 65 | Ht 63.0 in | Wt 134.0 lb

## 2010-12-21 DIAGNOSIS — J40 Bronchitis, not specified as acute or chronic: Secondary | ICD-10-CM

## 2010-12-21 DIAGNOSIS — J309 Allergic rhinitis, unspecified: Secondary | ICD-10-CM

## 2010-12-21 DIAGNOSIS — G4733 Obstructive sleep apnea (adult) (pediatric): Secondary | ICD-10-CM

## 2010-12-21 MED ORDER — EPINEPHRINE 0.3 MG/0.3ML IJ DEVI: 0.3000 mg | Freq: Once | INTRAMUSCULAR | Status: AC

## 2010-12-21 MED ORDER — AMOXICILLIN-POT CLAVULANATE 875-125 MG PO TABS: 1.0000 | ORAL_TABLET | Freq: Two times a day (BID) | ORAL | Status: AC

## 2010-12-21 NOTE — Patient Instructions (Signed)
Script for [employer] sent for augmentin for sinus infection  Flu vax

## 2010-12-21 NOTE — Progress Notes (Signed)
12/21/10- 65 year old female former smoker followed for rhinoconjunctivitis, asthma, obstructive sleep apnea, history DVT Last here- 12/21/2009 She she has had replacement of her previous total knee joint replacement on the right and is now feeling better. Because of repeated knee surgeries, she has not driven in 3 years. Not using CPAP, blaming problems with her gums. She had needed some grafts and thinks pressure from her CPAP damage those so that might need to be done again. She has lost 15-18 pounds. Husband says she is not snoring. 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 Now has a cat but won't let in the bedroom. Allergy symptoms have not changed much but she notices poor sense of taste and smell. She uses over-the-counter generic allergy pills. Dentist had given Z-Pak for sinusitis. She continues allergy vaccine at 1:10 without problem.   ROS See HPI Constitutional:   No-   weight loss, night sweats, fevers, chills, fatigue, lassitude. HEENT:   No-  headaches, difficulty swallowing, +tooth/dental problems, sore throat,       No-  sneezing, itching, ear ache, +nasal congestion, post nasal drip,  CV:  No-   chest pain, orthopnea, PND, swelling in lower extremities, anasarca, dizziness, palpitations Resp: No-   shortness of breath with exertion or at rest.              No-   productive cough,  No non-productive cough,  No- coughing up of blood.              No-   change in color of mucus.  No- wheezing.   Skin: No-   rash or lesions. GI:  No-   heartburn, indigestion, abdominal pain, nausea, vomiting, diarrhea,                 change in bowel habits, loss of appetite GU: No-   dysuria, change in color of urine, no urgency or frequency.  No- flank pain. MS:  +   joint pain or swelling.  No- decreased range of motion.  No- back pain. Neuro-     nothing unusual Psych:  No- change in mood or affect. No depression or anxiety.  No memory loss.   OBJ General- Alert, Oriented, Affect-appropriate, Distress-  none acute Skin- rash-none, lesions- none, excoriation- none Lymphadenopathy- none Head- atraumatic            Eyes- Gross vision intact, PERRLA, conjunctivae clear secretions            Ears- Hearing, canals-normal            Nose- Clear, no-Septal dev, mucus, polyps, erosion, perforation             Throat- Mallampati II , mucosa clear , drainage- none, tonsils- atrophic Neck- flexible , trachea midline, no stridor , thyroid nl, carotid no bruit Chest - symmetrical excursion , unlabored           Heart/CV- RRR , no murmur , no gallop  , no rub, nl s1 s2                           - JVD- none , edema- none, stasis changes- none, varices- none           Lung- clear to P&A, wheeze- none, cough- none , dullness-none, rub- none           Chest wall-  Abd- tender-no, distended-no, bowel sounds-present, HSM- no Br/ Gen/ Rectal- Not done, not indicated Extrem- cyanosis-  none, clubbing, none, atrophy- none, strength- nl. Guarding right knee Neuro- grossly intact to observation

## 2010-12-22 ENCOUNTER — Ambulatory Visit (INDEPENDENT_AMBULATORY_CARE_PROVIDER_SITE_OTHER): Payer: Medicare Other

## 2010-12-22 DIAGNOSIS — J309 Allergic rhinitis, unspecified: Secondary | ICD-10-CM

## 2010-12-23 NOTE — Assessment & Plan Note (Signed)
With significant weight loss, reports that she is not snoring, and satisfied to leave her off of CPAP and see how she does.

## 2010-12-23 NOTE — Assessment & Plan Note (Signed)
We need to help her maintain control. Plan Augmentin, flu vaccine, refill EpiPen

## 2010-12-23 NOTE — Assessment & Plan Note (Signed)
We're continuing allergy vaccine. Symptomatic remedies with antihistamines, nasal steroid sprays and decongestants have been discussed

## 2011-01-05 ENCOUNTER — Telehealth: Payer: Self-pay | Admitting: Internal Medicine

## 2011-01-05 MED ORDER — FLUCONAZOLE 150 MG PO TABS: 150.0000 mg | ORAL_TABLET | Freq: Once | ORAL | Status: AC

## 2011-01-05 NOTE — Telephone Encounter (Signed)
Please offer diflucan 150 mg, # 4, 1 daily

## 2011-01-05 NOTE — Telephone Encounter (Signed)
Pt is aware of cy recs. Pt also aware rx was called into pharmacy and needed nothing further.

## 2011-01-05 NOTE — Telephone Encounter (Signed)
Spoke with patient-states she has a yeast infection and thrush since being on abx; pt is aware that CY is out of office for the afternoon and may be tomorrow before we get an answer on what to send. CY, Please advise what you would like to give patient. Thanks.

## 2011-01-10 ENCOUNTER — Other Ambulatory Visit: Payer: Self-pay | Admitting: Family Medicine

## 2011-01-10 DIAGNOSIS — Z1231 Encounter for screening mammogram for malignant neoplasm of breast: Secondary | ICD-10-CM

## 2011-02-14 ENCOUNTER — Ambulatory Visit
Admission: RE | Admit: 2011-02-14 | Discharge: 2011-02-14 | Disposition: A | Discharge status: Home / Self Care | Payer: Medicare Other | Source: Ambulatory Visit | Attending: Family Medicine | Admitting: Family Medicine

## 2011-02-14 DIAGNOSIS — Z1231 Encounter for screening mammogram for malignant neoplasm of breast: Secondary | ICD-10-CM

## 2011-03-11 DIAGNOSIS — M7989 Other specified soft tissue disorders: Secondary | ICD-10-CM | POA: Diagnosis not present

## 2011-04-08 DIAGNOSIS — T84498A Other mechanical complication of other internal orthopedic devices, implants and grafts, initial encounter: Secondary | ICD-10-CM | POA: Diagnosis not present

## 2011-04-08 DIAGNOSIS — Z950 Presence of cardiac pacemaker: Secondary | ICD-10-CM | POA: Diagnosis not present

## 2011-04-19 DIAGNOSIS — M159 Polyosteoarthritis, unspecified: Secondary | ICD-10-CM | POA: Diagnosis not present

## 2011-04-19 DIAGNOSIS — F329 Major depressive disorder, single episode, unspecified: Secondary | ICD-10-CM | POA: Diagnosis not present

## 2011-04-19 DIAGNOSIS — K219 Gastro-esophageal reflux disease without esophagitis: Secondary | ICD-10-CM | POA: Diagnosis not present

## 2011-04-19 DIAGNOSIS — E7800 Pure hypercholesterolemia, unspecified: Secondary | ICD-10-CM | POA: Diagnosis not present

## 2011-04-19 DIAGNOSIS — I10 Essential (primary) hypertension: Secondary | ICD-10-CM | POA: Diagnosis not present

## 2011-04-19 DIAGNOSIS — E119 Type 2 diabetes mellitus without complications: Secondary | ICD-10-CM | POA: Diagnosis not present

## 2011-04-19 DIAGNOSIS — J309 Allergic rhinitis, unspecified: Secondary | ICD-10-CM | POA: Diagnosis not present

## 2011-04-19 DIAGNOSIS — F411 Generalized anxiety disorder: Secondary | ICD-10-CM | POA: Diagnosis not present

## 2011-04-20 DIAGNOSIS — I10 Essential (primary) hypertension: Secondary | ICD-10-CM | POA: Diagnosis not present

## 2011-04-20 DIAGNOSIS — E7800 Pure hypercholesterolemia, unspecified: Secondary | ICD-10-CM | POA: Diagnosis not present

## 2011-04-20 DIAGNOSIS — I2510 Atherosclerotic heart disease of native coronary artery without angina pectoris: Secondary | ICD-10-CM | POA: Diagnosis not present

## 2011-04-20 DIAGNOSIS — Z950 Presence of cardiac pacemaker: Secondary | ICD-10-CM | POA: Diagnosis not present

## 2011-04-22 ENCOUNTER — Telehealth: Payer: Self-pay | Admitting: Internal Medicine

## 2011-04-22 MED ORDER — AMOXICILLIN-POT CLAVULANATE 875-125 MG PO TABS: 1.0000 | ORAL_TABLET | Freq: Two times a day (BID) | ORAL | Status: DC

## 2011-04-22 NOTE — Telephone Encounter (Signed)
Called, spoke with pt.    She c/o PND, HA across eyes, teeth are aching on right side, sneezing, and difficulty breathing out of nose x 2 wks but gradually getting worse.  She's been taking zyrtec x 2 days with no relief.  Would like rx for augmentin - [facility] [address]   She also states allergy shots do not seem to be working anymore and would like to know when she needs to have another allergy test done.    Allergies verified Allergies  Allergen Reactions  . Cephalexin   . Doxycycline   . Entex     REACTION: unknown  . Gabapentin     REACTION: unknown  . Levofloxacin     REACTION: unknown  . Lisinopril   . Losartan Potassium   . Metformin     REACTION: unknown  . Pregabalin     REACTION: unknown  . Rosiglitazone Maleate     REACTION: unknown  . Simvastatin     REACTION: unknown  . Sitagliptin Phosphate     REACTION: unknown    Note:  Pt would also like Dr. Maple Hudson to know she's wearing cpap every night from approx 10pm - 7am.   Dr. Maple Hudson, pls advise. Thanks!

## 2011-04-22 NOTE — Telephone Encounter (Signed)
Per CY-okay to give Augmentin 875 mg #14 take 1 po bid no refills and schedule patient in next open slot on allergy schedule for allergy testing(May or June is next open-but no rush per CY).

## 2011-04-22 NOTE — Telephone Encounter (Signed)
Spoke with pt and notified of recs per CDY. She verbalized understanding.  Rx sent to pharm.  Appt for allergy test scheduled for 08-10-11 and pt notified that she is not to take otc antihistamines, cold or sleep meds at least 3 days prior to this. She verbalized understanding and denied any questions.

## 2011-04-28 DIAGNOSIS — K219 Gastro-esophageal reflux disease without esophagitis: Secondary | ICD-10-CM | POA: Diagnosis not present

## 2011-04-28 DIAGNOSIS — K5900 Constipation, unspecified: Secondary | ICD-10-CM | POA: Diagnosis not present

## 2011-04-29 ENCOUNTER — Telehealth: Payer: Self-pay | Admitting: Internal Medicine

## 2011-04-29 MED ORDER — AMOXICILLIN-POT CLAVULANATE 875-125 MG PO TABS: 1.0000 | ORAL_TABLET | Freq: Two times a day (BID) | ORAL | Status: AC

## 2011-04-29 NOTE — Telephone Encounter (Signed)
Spoke with pt and notified of recs per CDY Pt verbalized understanding and states nothing further needed Rx was sent to pharm 

## 2011-04-29 NOTE — Telephone Encounter (Signed)
Called, spoke with pt.  Pt states she finished the augmentin today but still having symptoms.  Reports she can breath some better but still having PND, HA, sinus pressure on right side, and has been blowing blood out of nose x 3 days now.  She's been using zyrtec with no relief.  She is requesting OV next week -- Dr. Delena Bali, pls advise if pt can be worked in.  Thanks!

## 2011-04-29 NOTE — Telephone Encounter (Signed)
Per CY-extend Augmentin 875 mg #14 1 po bid no refills if no better by next week then call for OV with TP if no openings for CY.

## 2011-05-06 ENCOUNTER — Telehealth: Payer: Self-pay | Admitting: Internal Medicine

## 2011-05-06 MED ORDER — FLUCONAZOLE 150 MG PO TABS: 150.0000 mg | ORAL_TABLET | Freq: Every day | ORAL | Status: AC

## 2011-05-06 NOTE — Telephone Encounter (Signed)
Pt has yeast infection due to Augmentin taken for sinus infection. Monistat has not helped. She would like something called to her pharmacy. Pls advise. Allergies  Allergen Reactions  . Cephalexin   . Doxycycline   . Entex     REACTION: unknown  . Gabapentin     REACTION: unknown  . Levofloxacin     REACTION: unknown  . Lisinopril   . Losartan Potassium   . Metformin     REACTION: unknown  . Pregabalin     REACTION: unknown  . Rosiglitazone Maleate     REACTION: unknown  . Simvastatin     REACTION: unknown  . Sitagliptin Phosphate     REACTION: unknown

## 2011-05-06 NOTE — Telephone Encounter (Signed)
Per CY-okay to give Diflucan 150 mg #2 take 1 daily x 2 days no refills. Pt aware [facility] [address]

## 2011-05-09 ENCOUNTER — Ambulatory Visit (INDEPENDENT_AMBULATORY_CARE_PROVIDER_SITE_OTHER): Payer: Medicare Other

## 2011-05-09 DIAGNOSIS — J309 Allergic rhinitis, unspecified: Secondary | ICD-10-CM

## 2011-05-18 ENCOUNTER — Encounter: Payer: Self-pay | Admitting: Internal Medicine

## 2011-05-18 ENCOUNTER — Ambulatory Visit (INDEPENDENT_AMBULATORY_CARE_PROVIDER_SITE_OTHER): Payer: Medicare Other | Admitting: Internal Medicine

## 2011-05-18 VITALS — BP 150/72 | HR 76 | Ht 63.0 in | Wt 144.6 lb

## 2011-05-18 DIAGNOSIS — J309 Allergic rhinitis, unspecified: Secondary | ICD-10-CM | POA: Diagnosis not present

## 2011-05-18 DIAGNOSIS — J40 Bronchitis, not specified as acute or chronic: Secondary | ICD-10-CM

## 2011-05-18 MED ORDER — HYDROXYZINE HCL 10 MG PO TABS: 10.0000 mg | ORAL_TABLET | Freq: Three times a day (TID) | ORAL | Status: DC | PRN

## 2011-05-18 MED ORDER — FLUTICASONE PROPIONATE 50 MCG/ACT NA SUSP: NASAL | Status: DC

## 2011-05-18 NOTE — Assessment & Plan Note (Signed)
Allergic and non-allergic rhinitis. There are significant differences in the current profile vs her current vaccine. Plan- finish current vaccine, then restart with new mix and rebuild.

## 2011-05-18 NOTE — Progress Notes (Signed)
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 Because of repeated knee surgeries, she has not driven in 3 years. Not using CPAP, blaming problems with her gums. She had needed some grafts and thinks pressure from her CPAP damage those so that might need to be done again. She has lost 15-18 pounds. Husband says she is not snoring. 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 Now has a cat but won't let in the bedroom. Allergy symptoms have not changed much but she notices poor sense of taste and smell. She uses over-the-counter generic allergy pills. Dentist had given Z-Pak for sinusitis. She continues allergy vaccine at 1:10 without problem.  05/18/11- 66 year old female former smoker followed for rhinoconjunctivitis, asthma, obstructive sleep apnea, history DVT Here to update allergy skin tests: Perennial nasal congestion. Off antihistamines, c/o nasal itching,increased postnasal drip,  some green, frontal and maxillary pressure- worse starting last night. Not a cold. . . Allergy Skin Test-  Positive intradermal tests for grass, weed, tree pollens, dust, some molds  ROS-see HPI Constitutional:   No-   weight loss, night sweats, fevers, chills, fatigue, lassitude. HEENT:   +  headaches, difficulty swallowing, tooth/dental problems, sore throat,       No-  Sneezing,  + itching, , nasal congestion, post nasal drip,  CV:  No-   chest pain, orthopnea, PND, swelling in lower extremities, anasarca, dizziness, palpitations Resp: No-   shortness of breath with exertion or at rest.              No-   productive cough,  No non-productive cough,  No- coughing up of blood.              No-   change in color of mucus.  No- wheezing.   Skin: No-   rash or lesions. GI:  No-   heartburn, indigestion, abdominal pain, nausea,  vomiting, GU: . MS:  No-   joint pain or swelling. Neuro-     nothing unusual Psych:  No- change in mood or affect. No depression or anxiety.  No memory loss.  OBJ- Physical Exam General- Alert, Oriented, Affect-appropriate, Distress- none acute Skin- rash-none, lesions- none, excoriation- none Lymphadenopathy- none Head- atraumatic            Eyes- Gross vision intact, PERRLA, conjunctivae and secretions clear            Ears- Hearing, canals-normal            Nose- stuffy, no-Septal dev, mucus, polyps, erosion, perforation.  chronic nasal speech             Throat- Mallampati III , mucosa clear , drainage- none, tonsils- atrophic Neck- flexible , trachea midline, no stridor , thyroid nl, carotid no bruit Chest - symmetrical excursion , unlabored           Heart/CV- RRR , no murmur , no gallop  , no rub, nl s1 s2                            JVD- none , edema- none, stasis changes- none, varices- none           Lung- clear to P&A, wheeze- none, cough- none , dullness-none, rub- none           Chest wall-  L pacemaker Abd-  Br/ Gen/ Rectal- Not done, not indicated Extrem- cyanosis- none, clubbing, none, atrophy- none, strength- nl Neuro- grossly intact to observation

## 2011-05-18 NOTE — Patient Instructions (Addendum)
Finish present vaccine  When your current supply is used up, we will remix a new vaccine and build up  Script for [employer] for hydroxyzine antihistamine- will cause drowsiness

## 2011-05-23 ENCOUNTER — Encounter: Payer: Self-pay | Admitting: Internal Medicine

## 2011-06-13 ENCOUNTER — Telehealth: Payer: Self-pay | Admitting: Internal Medicine

## 2011-06-13 NOTE — Telephone Encounter (Signed)
Spoke with patient-I told her the medication that we gave her while she was in the office for skin testing was Claritin and she could get that OTC; she stated it seem to help her.

## 2011-06-21 ENCOUNTER — Ambulatory Visit: Payer: Medicare Other | Admitting: Internal Medicine

## 2011-06-27 ENCOUNTER — Telehealth: Payer: Self-pay | Admitting: Internal Medicine

## 2011-06-27 NOTE — Telephone Encounter (Signed)
appt set for tomorrow at 9:45. Jakin Pavao, CMA  

## 2011-06-28 ENCOUNTER — Ambulatory Visit (INDEPENDENT_AMBULATORY_CARE_PROVIDER_SITE_OTHER): Payer: Medicare Other | Admitting: Internal Medicine

## 2011-06-28 ENCOUNTER — Encounter: Payer: Self-pay | Admitting: Internal Medicine

## 2011-06-28 VITALS — BP 136/68 | HR 67 | Ht 63.0 in | Wt 146.4 lb

## 2011-06-28 DIAGNOSIS — G4733 Obstructive sleep apnea (adult) (pediatric): Secondary | ICD-10-CM | POA: Diagnosis not present

## 2011-06-28 DIAGNOSIS — R439 Unspecified disturbances of smell and taste: Secondary | ICD-10-CM | POA: Diagnosis not present

## 2011-06-28 DIAGNOSIS — F411 Generalized anxiety disorder: Secondary | ICD-10-CM | POA: Diagnosis not present

## 2011-06-28 DIAGNOSIS — J309 Allergic rhinitis, unspecified: Secondary | ICD-10-CM

## 2011-06-28 DIAGNOSIS — J310 Chronic rhinitis: Secondary | ICD-10-CM

## 2011-06-28 DIAGNOSIS — R430 Anosmia: Secondary | ICD-10-CM

## 2011-06-28 DIAGNOSIS — R51 Headache: Secondary | ICD-10-CM

## 2011-06-28 MED ORDER — PHENYLEPHRINE HCL 1 % NA SOLN
1.0000 [drp] | Freq: Once | NASAL | Status: AC
  Administered 2011-06-28: 1 [drp] via NASAL

## 2011-06-28 MED ORDER — METHYLPREDNISOLONE ACETATE 80 MG/ML IJ SUSP
80.0000 mg | Freq: Once | INTRAMUSCULAR | Status: AC
  Administered 2011-06-28: 80 mg via INTRAMUSCULAR

## 2011-06-28 NOTE — Patient Instructions (Signed)
Order- referral to [facility]/   Chronic rhinitis and anosmia  Neb neo nasal   Depo 80  Sample Dynamist nasal spray  1-2 puffs at least once, maybe twice daily

## 2011-06-28 NOTE — Progress Notes (Signed)
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 Because of repeated knee surgeries, she has not driven in 3 years. Not using CPAP, blaming problems with her gums. She had needed some grafts and thinks pressure from her CPAP damage those so that might need to be done again. She has lost 15-18 pounds. Husband says she is not snoring. 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 Now has a cat but won't let in the bedroom. Allergy symptoms have not changed much but she notices poor sense of taste and smell. She uses over-the-counter generic allergy pills. Dentist had given Z-Pak for sinusitis. She continues allergy vaccine at 1:10 without problem.  05/18/11- 66 year old female former smoker followed for rhinoconjunctivitis, asthma, obstructive sleep apnea, history DVT Here to update allergy skin tests: Perennial nasal congestion. Off antihistamines, c/o nasal itching,increased postnasal drip,  some green, frontal and maxillary pressure- worse starting last night. Not a cold. . . Allergy Skin Test-  Positive intradermal tests for grass, weed, tree pollens, dust, some molds  06/28/11- 66 year old female former smoker followed for rhinoconjunctivitis, asthma, obstructive sleep apnea, history DVT Acute visit-states unable to breathe and feels like she cant get air; nasal congestion; no taste or smell since 10-2010 (been happening that long) Comes in tearful and anxious today. Husband is with her. She dates decreased sense of taste and smell from the time of her knee surgery in August of 2012. Can't breathe well through her nose. Face hurts, ears stopped up, sneezing. Complains mostly of the sense of pressure through her frontal and maxillary areas and ears. She is using up her current vaccine supply with intention of  restarting with new mix from skin testing in 3/13.Marland Kitchen  ROS-see HPI Constitutional:   No-   weight loss, night sweats, fevers, chills, fatigue, lassitude. HEENT:   +  headaches, difficulty swallowing, tooth/dental problems, sore throat,       No-  Sneezing,  + itching, , nasal congestion, post nasal drip,  CV:  No-   chest pain, orthopnea, PND, swelling in lower extremities, anasarca, dizziness, palpitations Resp: No-   shortness of breath with exertion or at rest.              No-   productive cough,  No non-productive cough,  No- coughing up of blood.              No-   change in color of mucus.  No- wheezing.   Skin: Itching with sore excoriations GI:  No-   heartburn, indigestion, abdominal pain, nausea, vomiting, GU: . MS:  No-   joint pain or swelling. Neuro-     nothing unusual Psych:  No- change in mood or affect. No depression or anxiety.  No memory loss.  OBJ- Physical Exam General- Alert, Oriented, Affect-anxious/ tearful, Distress- none acute Skin- Dry skin with a few excoriations back of neck. Lymphadenopathy- none Head- atraumatic            Eyes- Gross vision intact, PERRLA, conjunctivae and secretions clear            Ears- Hearing, canals-normal            Nose- stuffy, no-Septal dev, mucus, polyps, erosion, perforation.  chronic nasal speech, sniffing            Throat- Mallampati III-IV , mucosa clear , drainage-  none, tonsils- atrophic Neck- flexible , trachea midline, no stridor , thyroid nl, carotid no bruit Chest - symmetrical excursion , unlabored           Heart/CV- RRR , no murmur , no gallop  , no rub, nl s1 s2                            JVD- none , edema- none, stasis changes- none, varices- none           Lung- clear to P&A, wheeze- none, cough- none , dullness-none, rub- none           Chest wall- L pacemaker Abd-  Br/ Gen/ Rectal- Not done, not indicated Extrem- cyanosis- none, clubbing, none, atrophy- none, strength- nl Neuro- grossly intact to  observation

## 2011-06-29 ENCOUNTER — Other Ambulatory Visit: Payer: Self-pay | Admitting: Otolaryngology

## 2011-06-29 ENCOUNTER — Ambulatory Visit
Admission: RE | Admit: 2011-06-29 | Discharge: 2011-06-29 | Disposition: A | Discharge status: Home / Self Care | Payer: Medicare Other | Source: Ambulatory Visit | Attending: Otolaryngology | Admitting: Otolaryngology

## 2011-06-29 DIAGNOSIS — J301 Allergic rhinitis due to pollen: Secondary | ICD-10-CM

## 2011-06-29 DIAGNOSIS — J3489 Other specified disorders of nose and nasal sinuses: Secondary | ICD-10-CM | POA: Diagnosis not present

## 2011-06-29 DIAGNOSIS — J309 Allergic rhinitis, unspecified: Secondary | ICD-10-CM | POA: Diagnosis not present

## 2011-06-29 DIAGNOSIS — R439 Unspecified disturbances of smell and taste: Secondary | ICD-10-CM | POA: Diagnosis not present

## 2011-07-02 NOTE — Assessment & Plan Note (Signed)
Currently increased by somatic discomfort.

## 2011-07-02 NOTE — Assessment & Plan Note (Signed)
Continues CPAP all night every night

## 2011-07-02 NOTE — Assessment & Plan Note (Addendum)
Exacerbation consistent with current seasonal pollen peak.  Plan-allergy lab will remixing her vaccine based on skin testing from March, 2013.         Allergy lab will bring makes vaccine based on lady skin test results.
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
## 2011-07-05 ENCOUNTER — Telehealth: Payer: Self-pay | Admitting: Internal Medicine

## 2011-07-05 MED ORDER — ACRIVASTINE-PSEUDOEPHEDRINE 8-60 MG PO CAPS: ORAL_CAPSULE | ORAL | Status: DC

## 2011-07-05 NOTE — Telephone Encounter (Signed)
Called, spoke with pt.  Pt was seen on 06/28/11.  States she "might be a tad better but still is so much congestion."  Reports she has nasal congestion, PND, nonprod cough, right eye is watery, some chest tightness, and teeth are hurting from the congestion -- this is worse on the right side.  She is taking claritin and dymista but not helping much.  Was seen ENT and was told the xrays are ok - no polyps.  She is requesting CDY's recs - ? Is there a stronger antihistamine/decongestant she can take?  Dr. Maple Hudson, pls advise.  Thank you.  [facility] [address]  Allergies  Allergen Reactions  . Cephalexin   . Doxycycline   . Entex     REACTION: unknown  . Gabapentin     REACTION: unknown  . Levofloxacin     REACTION: unknown  . Lisinopril   . Losartan Potassium   . Metformin     REACTION: unknown  . Pregabalin     REACTION: unknown  . Rosiglitazone Maleate     REACTION: unknown  . Simvastatin     REACTION: unknown  . Sitagliptin Phosphate     REACTION: unknown

## 2011-07-05 NOTE — Telephone Encounter (Signed)
Per CY-Rx Semprex-D #30 take 1 po qd prn.

## 2011-07-05 NOTE — Telephone Encounter (Signed)
I spoke with pt and is aware of CDY recs. rx has been sent to the pharmacy. Will sign off message

## 2011-07-07 DIAGNOSIS — E119 Type 2 diabetes mellitus without complications: Secondary | ICD-10-CM | POA: Diagnosis not present

## 2011-07-07 DIAGNOSIS — E7800 Pure hypercholesterolemia, unspecified: Secondary | ICD-10-CM | POA: Diagnosis not present

## 2011-07-07 DIAGNOSIS — M159 Polyosteoarthritis, unspecified: Secondary | ICD-10-CM | POA: Diagnosis not present

## 2011-07-07 DIAGNOSIS — J309 Allergic rhinitis, unspecified: Secondary | ICD-10-CM | POA: Diagnosis not present

## 2011-07-07 DIAGNOSIS — F329 Major depressive disorder, single episode, unspecified: Secondary | ICD-10-CM | POA: Diagnosis not present

## 2011-07-07 DIAGNOSIS — F411 Generalized anxiety disorder: Secondary | ICD-10-CM | POA: Diagnosis not present

## 2011-07-07 DIAGNOSIS — I10 Essential (primary) hypertension: Secondary | ICD-10-CM | POA: Diagnosis not present

## 2011-07-11 ENCOUNTER — Telehealth: Payer: Self-pay | Admitting: Internal Medicine

## 2011-07-11 MED ORDER — AZELASTINE-FLUTICASONE 137-50 MCG/ACT NA SUSP: 2.0000 | Freq: Two times a day (BID) | NASAL | Status: DC | PRN

## 2011-07-11 NOTE — Telephone Encounter (Signed)
Per CY-okay Dymista.

## 2011-07-11 NOTE — Telephone Encounter (Signed)
I spoke with pt and she states at her last OV CDY gave her a sample of the dymista and states it worked well and has helped with her allergies. She is requesting a 90 day supply sent to [facility] and a sample bc it will take 10 days for this to come in the mail. Please advise Dr. Maple Hudson, thanks

## 2011-07-11 NOTE — Telephone Encounter (Signed)
I spoke with pt and is aware rx has been sent to [facility]. Pt did not want rx sent to local pharmacy. She states she will call back if she needs this and does not receive this in time. Will sign off message

## 2011-07-18 DIAGNOSIS — I495 Sick sinus syndrome: Secondary | ICD-10-CM | POA: Diagnosis not present

## 2011-07-18 DIAGNOSIS — Z950 Presence of cardiac pacemaker: Secondary | ICD-10-CM | POA: Diagnosis not present

## 2011-08-09 ENCOUNTER — Telehealth: Payer: Self-pay | Admitting: *Deleted

## 2011-08-09 NOTE — Telephone Encounter (Signed)
I will have pt. Sign wavier and give her an Epi-pen rx. I will teach(refresh her memory) on the build up process and of course tell her to call if she has any questions or problems.

## 2011-08-09 NOTE — Telephone Encounter (Signed)
In past ph.notes it appears that Mrs.Gens is giving herself shots. You are starting her on a new rx (1:50,000).Does she take her first shot here and we teach her, is she taking her vac. to another Dr.,or is she going to be getting her vac.here? Please advise.

## 2011-08-09 NOTE — Telephone Encounter (Signed)
Ok to give her own shots with instruction, epipen, etc.

## 2011-08-10 ENCOUNTER — Other Ambulatory Visit: Payer: Self-pay | Admitting: Internal Medicine

## 2011-08-10 ENCOUNTER — Ambulatory Visit: Payer: Medicare Other | Admitting: Internal Medicine

## 2011-08-10 MED ORDER — "TUBERCULIN SYRINGE 26G X 3/8"" 1 ML MISC": Status: DC

## 2011-08-11 ENCOUNTER — Ambulatory Visit (INDEPENDENT_AMBULATORY_CARE_PROVIDER_SITE_OTHER): Payer: Medicare Other

## 2011-08-11 DIAGNOSIS — J309 Allergic rhinitis, unspecified: Secondary | ICD-10-CM | POA: Diagnosis not present

## 2011-08-22 DIAGNOSIS — H3530 Unspecified macular degeneration: Secondary | ICD-10-CM | POA: Diagnosis not present

## 2011-08-30 ENCOUNTER — Telehealth: Payer: Self-pay | Admitting: Allergy

## 2011-08-30 NOTE — Telephone Encounter (Signed)
Pt was mailed out allergy vaccine and instructions an well verbally went over the phone..  Started the 1:50,000 jun 14th. Was told to start at 0.1 and continue to increase until she reaches  0.8. Pt called to day and said she has only been doing 0.1 for past 5 injections. Advise pt to go ahead and  Increase her injection today to 0.2 and on Friday6 -26-2013  0.3 and so on until she reaches 0.8. Pt verbally understood, pt also  Has an appt with CY on 09-13-11.. Do you want [place] to make up her 1:5000 and go over instructions  with her  At her visit > Dr Maple Hudson  Please advise

## 2011-08-31 NOTE — Telephone Encounter (Signed)
Per CY wants pt to bring in a calander so we can mark the dose an the dates so she will be on schedule with the build up. Spoke with patient this morning and she verbally understood. Nothing further was needed.

## 2011-09-07 DIAGNOSIS — N3946 Mixed incontinence: Secondary | ICD-10-CM | POA: Diagnosis not present

## 2011-09-07 DIAGNOSIS — N3944 Nocturnal enuresis: Secondary | ICD-10-CM | POA: Diagnosis not present

## 2011-09-13 ENCOUNTER — Ambulatory Visit (INDEPENDENT_AMBULATORY_CARE_PROVIDER_SITE_OTHER): Payer: Medicare Other | Admitting: Internal Medicine

## 2011-09-13 ENCOUNTER — Encounter: Payer: Self-pay | Admitting: Internal Medicine

## 2011-09-13 VITALS — BP 118/68 | HR 60 | Ht 63.0 in | Wt 144.6 lb

## 2011-09-13 DIAGNOSIS — R1319 Other dysphagia: Secondary | ICD-10-CM

## 2011-09-13 DIAGNOSIS — G4733 Obstructive sleep apnea (adult) (pediatric): Secondary | ICD-10-CM | POA: Diagnosis not present

## 2011-09-13 DIAGNOSIS — R1310 Dysphagia, unspecified: Secondary | ICD-10-CM | POA: Diagnosis not present

## 2011-09-13 DIAGNOSIS — R51 Headache: Secondary | ICD-10-CM

## 2011-09-13 DIAGNOSIS — J309 Allergic rhinitis, unspecified: Secondary | ICD-10-CM | POA: Diagnosis not present

## 2011-09-13 DIAGNOSIS — J383 Other diseases of vocal cords: Secondary | ICD-10-CM

## 2011-09-13 DIAGNOSIS — R3989 Other symptoms and signs involving the genitourinary system: Secondary | ICD-10-CM | POA: Diagnosis not present

## 2011-09-13 NOTE — Progress Notes (Signed)
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 Because of repeated knee surgeries, she has not driven in 3 years. Not using CPAP, blaming problems with her gums. She had needed some grafts and thinks pressure from her CPAP damage those so that might need to be done again. She has lost 15-18 pounds. Husband says she is not snoring. 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 Now has a cat but won't let in the bedroom. Allergy symptoms have not changed much but she notices poor sense of taste and smell. She uses over-the-counter generic allergy pills. Dentist had given Z-Pak for sinusitis. She continues allergy vaccine at 1:10 without problem.  05/18/11- 66 year old female former smoker followed for rhinoconjunctivitis, asthma, obstructive sleep apnea, history DVT Here to update allergy skin tests: Perennial nasal congestion. Off antihistamines, c/o nasal itching,increased postnasal drip,  some green, frontal and maxillary pressure- worse starting last night. Not a cold. . . Allergy Skin Test-  Positive intradermal tests for grass, weed, tree pollens, dust, some molds  06/28/11- 66 year old female former smoker followed for rhinoconjunctivitis, asthma, obstructive sleep apnea, history DVT Acute visit-states unable to breathe and feels like she cant get air; nasal congestion; no taste or smell since 10-2010 (been happening that long) Comes in tearful and anxious today. Husband is with her. She dates decreased sense of taste and smell from the time of her knee surgery in August of 2012. Can't breathe well through her nose. Face hurts, ears stopped up, sneezing. Complains mostly of the sense of pressure through her frontal and maxillary areas and ears. She is using up her current vaccine supply with intention of  restarting with new mix from skin testing in 3/13..  09/13/11- 66 year old female former smoker followed for rhinoconjunctivitis, asthma, obstructive sleep apnea, history DVT Rebuilding allergy vaccine with new mix.  Pt c/o nasal congestion, dry cough and trouble breathing.  Pt states she feels as though something is "stuck in her throat." She states that this "may be anxiety". She always feels blockage in her nose after history of nasal fracture when a garage door came down on her face. Nasal strips did not help much. She likes Dymista nasal spray. Wears CPAP all night every night, doing well. Sleeps with night guard for bruxism.  ROS-see HPI Constitutional:   No-   weight loss, night sweats, fevers, chills, fatigue, lassitude. HEENT:   +  headaches, difficulty swallowing, tooth/dental problems, sore throat,       No-  Sneezing,  + itching, , nasal congestion, post nasal drip,  CV:  No-   chest pain, orthopnea, PND, swelling in lower extremities, anasarca, dizziness, palpitations Resp: No-   shortness of breath with exertion or at rest.              No-   productive cough,  No non-productive cough,  No- coughing up of blood.              No-   change in color of mucus.  No- wheezing.   Skin: Itching with sore excoriations GI:  No-   heartburn, indigestion, abdominal pain, nausea, vomiting, GU: . MS:  No-   joint pain or swelling. Neuro-     nothing unusual Psych:  No- change in mood or affect. No depression or anxiety.  No memory loss.  OBJ- Physical Exam General- Alert,  Oriented, Affect-much less anxious than last visit , Distress- none acute Skin- Dry skin with a few excoriations back of neck. Lymphadenopathy- none Head- atraumatic            Eyes- Gross vision intact, PERRLA, conjunctivae and secretions clear            Ears- Hearing, canals-normal            Nose- stuffy, no-Septal dev, mucus, polyps, erosion, perforation.  chronic nasal speech, sniffing            Throat-  Mallampati III-IV , mucosa clear , drainage- none, tonsils- atrophic Neck- flexible , trachea midline, no stridor , thyroid nl, carotid no bruit Chest - symmetrical excursion , unlabored           Heart/CV- RRR , no murmur , no gallop  , no rub, nl s1 s2                            JVD- none , edema- none, stasis changes- none, varices- none           Lung- clear to P&A, wheeze- none, cough- none , dullness-none, rub- none           Chest wall- L pacemaker Abd-  Br/ Gen/ Rectal- Not done, not indicated Extrem- cyanosis- none, clubbing, none, atrophy- none, strength- nl Neuro- grossly intact to observation

## 2011-09-13 NOTE — Patient Instructions (Addendum)
Order- schedule Speech Therapy assisted Modified Barium dx initiation dysphagia, vocal cord dysfunction  Start rebuilding allergy vaccine as directed.  Ok to use a little Afrin nasal spray occasionally, but not every day

## 2011-09-19 ENCOUNTER — Ambulatory Visit (INDEPENDENT_AMBULATORY_CARE_PROVIDER_SITE_OTHER): Payer: Medicare Other

## 2011-09-19 ENCOUNTER — Ambulatory Visit: Payer: Medicare Other | Admitting: Internal Medicine

## 2011-09-19 DIAGNOSIS — J309 Allergic rhinitis, unspecified: Secondary | ICD-10-CM | POA: Diagnosis not present

## 2011-09-19 NOTE — Assessment & Plan Note (Signed)
Good compliance and control with CPAP 

## 2011-09-19 NOTE — Assessment & Plan Note (Signed)
We considered laryngospasm and discussed option for barium swallow

## 2011-09-19 NOTE — Assessment & Plan Note (Signed)
While allergy vaccine is building, we discussed cautious use of Afrin occasionally for trial

## 2011-09-20 ENCOUNTER — Ambulatory Visit (HOSPITAL_COMMUNITY)
Admission: RE | Admit: 2011-09-20 | Discharge: 2011-09-20 | Disposition: A | Discharge status: Home / Self Care | Payer: Medicare Other | Source: Ambulatory Visit | Attending: Internal Medicine | Admitting: Internal Medicine

## 2011-09-20 DIAGNOSIS — J383 Other diseases of vocal cords: Secondary | ICD-10-CM

## 2011-09-20 DIAGNOSIS — R1310 Dysphagia, unspecified: Secondary | ICD-10-CM | POA: Diagnosis not present

## 2011-09-20 NOTE — Procedures (Signed)
Objective Swallowing Evaluation: Modified Barium Swallowing Study  Patient Details  Name: [name] MRN: [id number] Date of Birth: [birth year]  Today's Date: 09/20/2011 Time: 1015-1117 SLP Time Calculation (min): 62 min  Past Medical History:  Past Medical History  Diagnosis Date  . Total knee replacement status   . Disorders of sacrum   . Depressive disorder, not elsewhere classified   . Pain in joint, other specified sites   . Pain in joint, lower leg   . Unspecified asthma   . Cardiac pacemaker in situ   . Other dysphagia   . Obstructive sleep apnea (adult) (pediatric)   . Anxiety state, unspecified   . Headache   . Unspecified cerebral artery occlusion with cerebral infarction   . Myalgia and myositis, unspecified   . Type II or unspecified type diabetes mellitus without mention of complication, not stated as uncontrolled   . Other voice and resonance disorders   . Bronchitis, not specified as acute or chronic   . Allergic rhinitis, cause unspecified   . Pulmonary embolism    Past Surgical History:  Past Surgical History  Procedure Date  . Total knee arthroplasty 2002    right  . Total knee repair 2010    right  . Vesicovaginal fistula closure w/ tah 1974  . Bilateral salpingoophorectomy 1991  . Cholecystectomy   . Nasal fracture surgery    HPI:  66 yo female referred by Dr Maple Hudson for MBS due to pt's VCD and complaint of something stuck in throat, initiation difficulties.  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  PSH + for nasal surgery,  right carpal tunnel surgery 2009, pacemaker.       Assessment / Plan / Recommendation Clinical Impression  Dysphagia Diagnosis: Suspected primary esophageal  dysphagia;Within Functional Limits Clinical impression: Pt presents with overall functional oropharyngeal swallow without aspiration or penetration of any consistency tested.  Pt observed to cough and complain of "something stuck in throat" throughout entire evaluation - with and without swallowing barium.  Of note, during testing when pt pointed to proximal esophagus to indicate sensation of stasis - xray appeared with + stasis.  Suspect primary esophageal issues based on symptoms and radiographic findings -radiologist not present.  Pt may benefit from MD consideration for dedicated esophageal eval.   Pt does acknowledge globus being worse with allergy exacerbation.    Reflux Symptom Index adminstered to pt with her score being 33/45 indicating high likelihood of LPR - Pt's reported symptoms appear consistent with LPR.  Given pt is taking a PPI, provided LPR & reflux precautions in writing.  Pt also observed to belch frequently after testing .    Since pt's report of symptoms of dysphagia was nebulous, advised her to be mindful and document symptoms to assist MD with diagnosis.    SLP also advised pt to relaxation techniques eg: panting, consonant /F/ to relax vocal folds during times of difficulty breathing.    Thanks for this referral.      Treatment Recommendation       Diet Recommendation Regular;Thin liquid   Medication Administration: Whole meds with liquid (or with solids, yogurt - gummies, chewables, etc) Supervision: Patient able to self feed Compensations: Slow rate;Small sips/bites Postural Changes and/or Swallow Maneuvers: Upright 30-60 min after meal;Seated upright 90 degrees    Other  Recommendations Recommended Consults: Consider esophageal assessment Oral Care Recommendations: Oral care BID   Follow Up Recommendations  None    Frequency and Duration            SLP Swallow Goals     General Date of Onset: 09/20/11 HPI: 66 yo female referred by Dr Maple Hudson for MBS due  to pt's VCD and complaint of something stuck in throat, initiation difficulties.  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  PSH + for nasal surgery,  right carpal tunnel surgery 2009, pacemaker.   Type of Study: Modified Barium Swallowing Study Reason for Referral: Objectively evaluate swallowing function Previous Swallow Assessment: esophagram Sept 11, 2003:  small amount of GE reflux, slight delay of 13 mm barium tablet through GE, otherwise normal Diet Prior to this Study: Regular;Thin liquids Temperature Spikes Noted: No Respiratory Status: Room air Behavior/Cognition: Alert;Cooperative;Pleasant mood Oral Cavity - Dentition: Adequate natural dentition Oral Motor / Sensory Function: Within functional limits (pt states she lost her sense of smell July 2012) Self-Feeding Abilities: Able to feed self Patient Positioning: Upright in chair Baseline Vocal Quality: Clear (hyponasal speech) Volitional Cough: Strong Volitional Swallow: Able to elicit Anatomy: Within functional limits Pharyngeal Secretions: Not observed secondary MBS    Reason for Referral Objectively evaluate swallowing function   Oral Phase   Oral Phase WFL  Pharyngeal Phase Pharyngeal Phase: WFL   Cervical Esophageal Phase Cervical Esophageal Phase: Impaired     Donavan Burnet, MS [facility] SLP [phone]

## 2011-09-22 ENCOUNTER — Other Ambulatory Visit: Payer: Self-pay | Admitting: Gastroenterology

## 2011-09-22 DIAGNOSIS — R1310 Dysphagia, unspecified: Secondary | ICD-10-CM | POA: Diagnosis not present

## 2011-09-22 DIAGNOSIS — R05 Cough: Secondary | ICD-10-CM | POA: Diagnosis not present

## 2011-09-22 DIAGNOSIS — K219 Gastro-esophageal reflux disease without esophagitis: Secondary | ICD-10-CM | POA: Diagnosis not present

## 2011-10-03 ENCOUNTER — Other Ambulatory Visit: Payer: Medicare Other

## 2011-10-06 DIAGNOSIS — F329 Major depressive disorder, single episode, unspecified: Secondary | ICD-10-CM | POA: Diagnosis not present

## 2011-10-07 ENCOUNTER — Ambulatory Visit
Admission: RE | Admit: 2011-10-07 | Discharge: 2011-10-07 | Disposition: A | Discharge status: Home / Self Care | Payer: Medicare Other | Source: Ambulatory Visit | Attending: Gastroenterology | Admitting: Gastroenterology

## 2011-10-07 DIAGNOSIS — R1310 Dysphagia, unspecified: Secondary | ICD-10-CM

## 2011-10-07 DIAGNOSIS — K228 Other specified diseases of esophagus: Secondary | ICD-10-CM | POA: Diagnosis not present

## 2011-10-13 DIAGNOSIS — Z7901 Long term (current) use of anticoagulants: Secondary | ICD-10-CM | POA: Diagnosis not present

## 2011-10-13 DIAGNOSIS — J309 Allergic rhinitis, unspecified: Secondary | ICD-10-CM | POA: Diagnosis not present

## 2011-10-13 DIAGNOSIS — K219 Gastro-esophageal reflux disease without esophagitis: Secondary | ICD-10-CM | POA: Diagnosis not present

## 2011-10-13 DIAGNOSIS — R1310 Dysphagia, unspecified: Secondary | ICD-10-CM | POA: Diagnosis not present

## 2011-10-13 DIAGNOSIS — L259 Unspecified contact dermatitis, unspecified cause: Secondary | ICD-10-CM | POA: Diagnosis not present

## 2011-11-02 ENCOUNTER — Encounter (HOSPITAL_COMMUNITY): Payer: Self-pay | Admitting: *Deleted

## 2011-11-02 ENCOUNTER — Other Ambulatory Visit: Payer: Self-pay | Admitting: Gastroenterology

## 2011-11-02 ENCOUNTER — Ambulatory Visit (HOSPITAL_COMMUNITY): Payer: Medicare Other

## 2011-11-02 ENCOUNTER — Ambulatory Visit (HOSPITAL_COMMUNITY)
Admission: RE | Admit: 2011-11-02 | Discharge: 2011-11-02 | Disposition: A | Discharge status: Home / Self Care | Payer: Medicare Other | Source: Ambulatory Visit | Attending: Gastroenterology | Admitting: Gastroenterology

## 2011-11-02 ENCOUNTER — Encounter (HOSPITAL_COMMUNITY)
Admission: RE | Disposition: A | Discharge status: Home / Self Care | Payer: Self-pay | Source: Ambulatory Visit | Attending: Gastroenterology

## 2011-11-02 DIAGNOSIS — Z950 Presence of cardiac pacemaker: Secondary | ICD-10-CM | POA: Diagnosis not present

## 2011-11-02 DIAGNOSIS — K219 Gastro-esophageal reflux disease without esophagitis: Secondary | ICD-10-CM | POA: Diagnosis not present

## 2011-11-02 DIAGNOSIS — Z7982 Long term (current) use of aspirin: Secondary | ICD-10-CM | POA: Diagnosis not present

## 2011-11-02 DIAGNOSIS — G4733 Obstructive sleep apnea (adult) (pediatric): Secondary | ICD-10-CM | POA: Insufficient documentation

## 2011-11-02 DIAGNOSIS — K2970 Gastritis, unspecified, without bleeding: Secondary | ICD-10-CM | POA: Diagnosis not present

## 2011-11-02 DIAGNOSIS — IMO0001 Reserved for inherently not codable concepts without codable children: Secondary | ICD-10-CM | POA: Insufficient documentation

## 2011-11-02 DIAGNOSIS — Z79899 Other long term (current) drug therapy: Secondary | ICD-10-CM | POA: Diagnosis not present

## 2011-11-02 DIAGNOSIS — R1310 Dysphagia, unspecified: Secondary | ICD-10-CM | POA: Insufficient documentation

## 2011-11-02 DIAGNOSIS — Z8673 Personal history of transient ischemic attack (TIA), and cerebral infarction without residual deficits: Secondary | ICD-10-CM | POA: Diagnosis not present

## 2011-11-02 DIAGNOSIS — K224 Dyskinesia of esophagus: Secondary | ICD-10-CM | POA: Diagnosis not present

## 2011-11-02 DIAGNOSIS — Z86711 Personal history of pulmonary embolism: Secondary | ICD-10-CM | POA: Insufficient documentation

## 2011-11-02 DIAGNOSIS — Z96659 Presence of unspecified artificial knee joint: Secondary | ICD-10-CM | POA: Insufficient documentation

## 2011-11-02 DIAGNOSIS — E119 Type 2 diabetes mellitus without complications: Secondary | ICD-10-CM | POA: Insufficient documentation

## 2011-11-02 DIAGNOSIS — K2990 Gastroduodenitis, unspecified, without bleeding: Secondary | ICD-10-CM | POA: Insufficient documentation

## 2011-11-02 DIAGNOSIS — K222 Esophageal obstruction: Secondary | ICD-10-CM | POA: Diagnosis not present

## 2011-11-02 HISTORY — PX: ESOPHAGOGASTRODUODENOSCOPY: SHX5428

## 2011-11-02 HISTORY — PX: SAVORY DILATION: SHX5439

## 2011-11-02 HISTORY — DX: Gastro-esophageal reflux disease without esophagitis: K21.9

## 2011-11-02 SURGERY — EGD (ESOPHAGOGASTRODUODENOSCOPY)
Anesthesia: Moderate Sedation

## 2011-11-02 MED ORDER — SODIUM CHLORIDE 0.9 % IV SOLN
INTRAVENOUS | Status: DC
  Administered 2011-11-02: 10:00:00 via INTRAVENOUS

## 2011-11-02 MED ORDER — FENTANYL CITRATE 0.05 MG/ML IJ SOLN
INTRAMUSCULAR | Status: AC
  Filled 2011-11-02: qty 4

## 2011-11-02 MED ORDER — FENTANYL CITRATE 0.05 MG/ML IJ SOLN
INTRAMUSCULAR | Status: DC | PRN
  Administered 2011-11-02 (×3): 25 ug via INTRAVENOUS

## 2011-11-02 MED ORDER — BUTAMBEN-TETRACAINE-BENZOCAINE 2-2-14 % EX AERO
INHALATION_SPRAY | CUTANEOUS | Status: DC | PRN
  Administered 2011-11-02: 2 via TOPICAL

## 2011-11-02 MED ORDER — MIDAZOLAM HCL 10 MG/2ML IJ SOLN
INTRAMUSCULAR | Status: AC
  Filled 2011-11-02: qty 4

## 2011-11-02 MED ORDER — DIPHENHYDRAMINE HCL 50 MG/ML IJ SOLN
INTRAMUSCULAR | Status: AC
  Filled 2011-11-02: qty 1

## 2011-11-02 MED ORDER — MIDAZOLAM HCL 10 MG/2ML IJ SOLN
INTRAMUSCULAR | Status: DC | PRN
  Administered 2011-11-02 (×3): 2 mg via INTRAVENOUS

## 2011-11-02 NOTE — H&P (Signed)
Subjective:   Patient is a 66 y.o. female presents with dysphagia. She's had a history of reflux in the past and chronic cough. A barium swallow was obtained which showed mild narrowing in the distal esophagus would delay of the barium pill and tertiary contractions throughout the esophagus due to her continued symptoms, an elective EGD and dilatation is to be performed. . Procedure including risks and benefits discussed in office.  Patient Active Problem List   Diagnosis Date Noted  . PULMONARY EMBOLISM 12/22/2008  . RHINOCONJUNCTIVITIS, ALLERGIC 12/20/2007  . SACROILIAC JOINT DYSFUNCTION 09/28/2007  . Depressive Disorder, not Elsewhere Classified 08/28/2007  . KNEE PAIN, RIGHT 08/28/2007  . PAIN IN JOINT OTHER SPECIFIED SITES 08/28/2007  . ASTHMA 06/09/2007  . CARDIAC PACEMAKER IN SITU 06/06/2007  . DIABETES MELLITUS 05/16/2007  . ANXIETY 05/16/2007  . OBSTRUCTIVE SLEEP APNEA 05/16/2007  . CVA 05/16/2007  . BRONCHITIS 05/16/2007  . FIBROMYALGIA 05/16/2007  . FACIAL PAIN 05/16/2007  . DYSPHONIA 05/16/2007  . OTHER DYSPHAGIA 05/16/2007   Past Medical History  Diagnosis Date  . Total knee replacement status   . Disorders of sacrum   . Depressive disorder, not elsewhere classified   . Pain in joint, other specified sites   . Pain in joint, lower leg   . Unspecified asthma   . Cardiac pacemaker in situ   . Other dysphagia   . Obstructive sleep apnea (adult) (pediatric)   . Anxiety state, unspecified   . Headache   . Unspecified cerebral artery occlusion with cerebral infarction   . Type II or unspecified type diabetes mellitus without mention of complication, not stated as uncontrolled   . Other voice and resonance disorders   . Bronchitis, not specified as acute or chronic   . Allergic rhinitis, cause unspecified   . Pulmonary embolism   . Myalgia and myositis, unspecified   . Pacemaker 2008  . GERD (gastroesophageal reflux disease)     Past Surgical History    Procedure Date  . Total knee arthroplasty 2002    right  . Total knee repair 2010    right  . Vesicovaginal fistula closure w/ tah 1974  . Bilateral salpingoophorectomy 1991  . Cholecystectomy   . Nasal fracture surgery   . Temporomandibular joint arthroplasty 1982    Prescriptions prior to admission  Medication Sig Dispense Refill  . aspirin 81 MG tablet Take 81 mg by mouth daily.        Marland Kitchen atenolol (TENORMIN) 50 MG tablet Take 50 mg by mouth daily.        Marland Kitchen atorvastatin (LIPITOR) 20 MG tablet Take 20 mg by mouth daily.        . BD PEN NEEDLE NANO U/F 32G X 4 MM MISC Use as directed for Insulin Injections      . buPROPion (WELLBUTRIN SR) 100 MG 12 hr tablet Take 1 tablet by mouth Daily.      Marland Kitchen diltiazem (TIAZAC) 120 MG 24 hr capsule Take 1 tablet by mouth daily.      . DULoxetine (CYMBALTA) 30 MG capsule Take 90 mg by mouth daily.       Marland Kitchen glipiZIDE (GLUCOTROL XL) 10 MG 24 hr tablet Take 10 mg by mouth daily.        . insulin detemir (LEVEMIR) 100 UNIT/ML injection 30 units in the morning and 60 units in the evening      . Omega-3 Fatty Acids (FISH OIL) 1200 MG CAPS Take 2 capsules by mouth daily.        Marland Kitchen  omeprazole-sodium bicarbonate (ZEGERID) 40-1100 MG per capsule Take 1 capsule by mouth at bedtime.        . polyethylene glycol powder (MIRALAX) powder Take 17 g by mouth as needed.        . traZODone (DESYREL) 50 MG tablet Take 50 mg by mouth at bedtime.        . TUBERCULIN SYR 1CC/26GX3/8" (B-D ALLERGY SYRINGE) 26G X 3/8" 1 ML MISC USE 2 SYRINGE'S TWICE A WEEK  100 each  11  . Acrivastine-Pseudoephedrine (SEMPREX-D) 8-60 MG CAPS 1 tablet once a day as needed  30 each  0  . Azelastine-Fluticasone (DYMISTA) 137-50 MCG/ACT SUSP Place 2 puffs into the nose 2 (two) times daily as needed.  3 Bottle  3  . EPINEPHrine (EPIPEN) 0.3 mg/0.3 mL DEVI Inject 0.3 mLs (0.3 mg total) into the muscle once. For severe allergic reaction  1 Device  prn   Allergies  Allergen Reactions  . Cephalexin    . Doxycycline   . Entex     REACTION: unknown  . Gabapentin     REACTION: unknown  . Levofloxacin     REACTION: unknown  . Lisinopril   . Losartan Potassium   . Metformin     REACTION: unknown  . Pregabalin     REACTION: unknown  . Rosiglitazone Maleate     REACTION: unknown  . Simvastatin     REACTION: unknown  . Sitagliptin Phosphate     REACTION: unknown    History  Substance Use Topics  . Smoking status: Former Smoker -- 0.3 packs/day for 20 years    Types: Cigarettes    Quit date: 09/12/2001  . Smokeless tobacco: Not on file   Comment: pt smoked less than 1/4 ppd  . Alcohol Use: No    No family history on file.   Objective:   Patient Vitals for the past 8 hrs:  BP Temp Temp src Resp SpO2 Height Weight  11/02/11 0915 171/67 mmHg 98.2 F (36.8 C) Oral 17  97 % 5\' 3"  (1.6 m) 63.05 kg (139 lb)         See MD Preop evaluation      Assessment:   Active Problems:  * No active hospital problems. *    Plan:   Patient comes in as outpatient for elective EGD and dilatation with fluoroscopy. Procedure has been discussed in detail in the office including the risk and benefits. She has had done before.

## 2011-11-02 NOTE — Op Note (Signed)
[facility] [address] [place], [address]   ENDOSCOPY PROCEDURE REPORT  PATIENT: [name], [name].  MR#: [id number] BIRTHDATE: 1945-05-30 , 66  yrs. old GENDER: Female ENDOSCOPIST:Lilian Fuhs Randa Evens, MD REFERRED BY:    Dr. Clovis Riley, Dr. Maple Hudson PROCEDURE DATE:  11/02/2011 PROCEDURE:   EGD, diagnostic and Savary dilation of esophagus ASA CLASS:    Class III INDICATIONS: dysphagia.  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  The Pentax Gastroscope [id number]  endoscope was introduced through the mouth  and advanced to the    .  The instrument was slowly withdrawn as the mucosa was fully examined.      ESOPHAGUS: The mucosa of the esophagus appeared normal.  There was minimal if any of stricture at the GE junction.   Using fluoroscopic guidance Savary guidewire was placed in the stomach and the scope withdrawn over the guidewire.  With the patient's neck extended we passed 12.8 mm, 14 mm, and 15 mm Savary dilators using fluoroscopic guidance.  No heme on the dilator and the dilator and guidewire were then withdrawn.  STOMACH: Abnormal mucosa was found.   Mild gastritis (inflammation) was found.  DUODENUM: The duodenal mucosa showed no abnormalities.          The scope was then withdrawn from the patient and the procedure completed.  COMPLICATIONS: There were no complications.   ENDOSCOPIC IMPRESSION: 1.   The mucosa of the esophagus appeared normal 2.   There was minimal if any of stricture at the GE junction 3.   Using fluoroscopic guidance Savary guidewire was placed in the stomach and the scope withdrawn over the guidewire.  With the patient's neck extended we passed 12.8 mm, 14 mm, and 15 mm  Savary dilators using fluoroscopic guidance.  No heme on the dilator and the dilator and guidewire were then withdrawn. 4.   Abnormal mucosa was found 5.   Gastritis (inflammation) was found 6.   The duodenal mucosa showed no abnormalities  RECOMMENDATIONS: we'll keep the patient on liquids for 6 hours and then slowly advance diet.  We'll ask her to see me back in the office in the next several weeks.  We'll keep her on PPI therapy.    _______________________________ eSignedCarman Ching, MD 11/02/2011 10:51 AM   standard discharge CC Dr. Clovis Riley, Dr. Maple Hudson  PATIENT NAME:  [name], [name]. MR#: [id number]

## 2011-11-03 ENCOUNTER — Encounter (HOSPITAL_COMMUNITY): Payer: Self-pay | Admitting: Gastroenterology

## 2011-11-03 ENCOUNTER — Encounter (HOSPITAL_COMMUNITY): Payer: Self-pay

## 2011-11-10 DIAGNOSIS — Z950 Presence of cardiac pacemaker: Secondary | ICD-10-CM | POA: Diagnosis not present

## 2011-11-15 ENCOUNTER — Ambulatory Visit (INDEPENDENT_AMBULATORY_CARE_PROVIDER_SITE_OTHER): Payer: Medicare Other | Admitting: Internal Medicine

## 2011-11-15 ENCOUNTER — Encounter: Payer: Self-pay | Admitting: Internal Medicine

## 2011-11-15 VITALS — BP 140/68 | HR 69 | Ht 63.0 in | Wt 143.2 lb

## 2011-11-15 DIAGNOSIS — Z23 Encounter for immunization: Secondary | ICD-10-CM | POA: Diagnosis not present

## 2011-11-15 DIAGNOSIS — K219 Gastro-esophageal reflux disease without esophagitis: Secondary | ICD-10-CM

## 2011-11-15 DIAGNOSIS — J45909 Unspecified asthma, uncomplicated: Secondary | ICD-10-CM | POA: Diagnosis not present

## 2011-11-15 DIAGNOSIS — J309 Allergic rhinitis, unspecified: Secondary | ICD-10-CM

## 2011-11-15 DIAGNOSIS — G4733 Obstructive sleep apnea (adult) (pediatric): Secondary | ICD-10-CM | POA: Diagnosis not present

## 2011-11-15 NOTE — Progress Notes (Signed)
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 Because of repeated knee surgeries, she has not driven in 3 years. Not using CPAP, blaming problems with her gums. She had needed some grafts and thinks pressure from her CPAP damage those so that might need to be done again. She has lost 15-18 pounds. Husband says she is not snoring. 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 Now has a cat but won't let in the bedroom. Allergy symptoms have not changed much but she notices poor sense of taste and smell. She uses over-the-counter generic allergy pills. Dentist had given Z-Pak for sinusitis. She continues allergy vaccine at 1:10 without problem.  05/18/11- 66 year old female former smoker followed for rhinoconjunctivitis, asthma, obstructive sleep apnea, history DVT Here to update allergy skin tests: Perennial nasal congestion. Off antihistamines, c/o nasal itching,increased postnasal drip,  some green, frontal and maxillary pressure- worse starting last night. Not a cold. . . Allergy Skin Test-  Positive intradermal tests for grass, weed, tree pollens, dust, some molds  06/28/11- 66 year old female former smoker followed for rhinoconjunctivitis, asthma, obstructive sleep apnea, history DVT Acute visit-states unable to breathe and feels like she cant get air; nasal congestion; no taste or smell since 10-2010 (been happening that long) Comes in tearful and anxious today. Husband is with her. She dates decreased sense of taste and smell from the time of her knee surgery in August of 2012. Can't breathe well through her nose. Face hurts, ears stopped up, sneezing. Complains mostly of the sense of pressure through her frontal and maxillary areas and ears. She is using up her current vaccine supply with intention of  restarting with new mix from skin testing in 3/13..  09/13/11- 66 year old female former smoker followed for rhinoconjunctivitis, asthma, obstructive sleep apnea, history DVT Rebuilding allergy vaccine with new mix.  Pt c/o nasal congestion, dry cough and trouble breathing.  Pt states she feels as though something is "stuck in her throat." She states that this "may be anxiety". She always feels blockage in her nose after history of nasal fracture when a garage door came down on her face. Nasal strips did not help much. She likes Dymista nasal spray. Wears CPAP all night every night, doing well. Sleeps with night guard for bruxism.  11/15/11-  66 year old female former smoker followed for rhinoconjunctivitis, asthma, obstructive sleep apnea, history DVT Rebuilding allergy vaccine with new mix.  Pt states that after last visit nasal congesiton had started to improve but for the past wk it has been worse. She has a clear nasal d/c. Cough is some better since last visit, but does bother her some and is dry. Notes fall pollen. Continues allergy vaccine 1:500, building GO.  Dr Randa Evens GI evaluating known active GERD with Zegerid/ Nexium. Did EGD-> relieved substernal pain she had been trying to blame on "allergy".Tells me she is "much better".  CPAP every night, all night, working well.   ROS-see HPI Constitutional:   No-   weight loss, night sweats, fevers, chills, fatigue, lassitude. HEENT:   +  headaches, difficulty swallowing, tooth/dental problems, sore throat,       No-  Sneezing,  + itching, + nasal congestion, post nasal drip,  CV:  No-   chest pain, orthopnea, PND, swelling in lower extremities, anasarca, dizziness, palpitations Resp: No-   shortness of breath with exertion or  at rest.              No-   productive cough,  No non-productive cough,  No- coughing up of blood.              No-   change in color of mucus.  No- wheezing.   Skin: Itching with sore excoriations GI:  No-    heartburn, indigestion, abdominal pain, nausea, vomiting, GU: . MS:  No-   joint pain or swelling. Neuro-     nothing unusual Psych:  No- change in mood or affect. No depression or anxiety.  No memory loss.  OBJ- Physical Exam General- Alert, Oriented, Affect-much less anxious than last visit , Distress- none acute Skin- Dry skin with a few excoriations back of neck. Lymphadenopathy- none Head- atraumatic            Eyes- Gross vision intact, PERRLA, conjunctivae and secretions clear            Ears- Hearing, canals-normal            Nose- stuffy, no-Septal dev, mucus, polyps, erosion, perforation.  +chronic nasal speech, sniffing( old nasal trauma)            Throat- Mallampati III-IV , mucosa clear , drainage- none, tonsils- atrophic Neck- flexible , trachea midline, no stridor , thyroid nl, carotid no bruit Chest - symmetrical excursion , unlabored           Heart/CV- RRR , no murmur , no gallop  , no rub, nl s1 s2                            JVD- none , edema- none, stasis changes- none, varices- none           Lung- clear to P&A, wheeze- none, cough- none , dullness-none, rub- none           Chest wall- +L pacemaker Abd-  Br/ Gen/ Rectal- Not done, not indicated Extrem- cyanosis- none, clubbing, none, atrophy- none, strength- nl Neuro- grossly intact to observation

## 2011-11-15 NOTE — Patient Instructions (Addendum)
Ok to continue allergy vaccine buildup  Flu vax  Please call as needed

## 2011-11-16 ENCOUNTER — Ambulatory Visit (INDEPENDENT_AMBULATORY_CARE_PROVIDER_SITE_OTHER): Payer: Medicare Other

## 2011-11-16 DIAGNOSIS — J309 Allergic rhinitis, unspecified: Secondary | ICD-10-CM | POA: Diagnosis not present

## 2011-11-23 DIAGNOSIS — K219 Gastro-esophageal reflux disease without esophagitis: Secondary | ICD-10-CM | POA: Insufficient documentation

## 2011-11-23 NOTE — Assessment & Plan Note (Addendum)
Now on Zegerid and Nexium per Dr Randa Evens. She now recognizes that some of her chest discomfort was related to reflux esophagitis rather than respiratory.

## 2011-11-23 NOTE — Assessment & Plan Note (Signed)
Good CPAP compliance and control. 

## 2011-11-23 NOTE — Assessment & Plan Note (Signed)
At 1:500 building without problem with new mix. Risk/ epipen reviewed.

## 2011-11-29 DIAGNOSIS — K219 Gastro-esophageal reflux disease without esophagitis: Secondary | ICD-10-CM | POA: Diagnosis not present

## 2012-01-09 DIAGNOSIS — I10 Essential (primary) hypertension: Secondary | ICD-10-CM | POA: Diagnosis not present

## 2012-01-09 DIAGNOSIS — J309 Allergic rhinitis, unspecified: Secondary | ICD-10-CM | POA: Diagnosis not present

## 2012-01-09 DIAGNOSIS — R0989 Other specified symptoms and signs involving the circulatory and respiratory systems: Secondary | ICD-10-CM | POA: Diagnosis not present

## 2012-01-09 DIAGNOSIS — F411 Generalized anxiety disorder: Secondary | ICD-10-CM | POA: Diagnosis not present

## 2012-01-09 DIAGNOSIS — E119 Type 2 diabetes mellitus without complications: Secondary | ICD-10-CM | POA: Diagnosis not present

## 2012-01-09 DIAGNOSIS — E7800 Pure hypercholesterolemia, unspecified: Secondary | ICD-10-CM | POA: Diagnosis not present

## 2012-01-09 DIAGNOSIS — M159 Polyosteoarthritis, unspecified: Secondary | ICD-10-CM | POA: Diagnosis not present

## 2012-01-09 DIAGNOSIS — F329 Major depressive disorder, single episode, unspecified: Secondary | ICD-10-CM | POA: Diagnosis not present

## 2012-01-11 DIAGNOSIS — R0989 Other specified symptoms and signs involving the circulatory and respiratory systems: Secondary | ICD-10-CM | POA: Diagnosis not present

## 2012-01-13 ENCOUNTER — Other Ambulatory Visit: Payer: Self-pay | Admitting: Family Medicine

## 2012-01-13 DIAGNOSIS — Z1231 Encounter for screening mammogram for malignant neoplasm of breast: Secondary | ICD-10-CM

## 2012-02-13 DIAGNOSIS — Z950 Presence of cardiac pacemaker: Secondary | ICD-10-CM | POA: Diagnosis not present

## 2012-02-13 DIAGNOSIS — I495 Sick sinus syndrome: Secondary | ICD-10-CM | POA: Diagnosis not present

## 2012-02-23 ENCOUNTER — Ambulatory Visit
Admission: RE | Admit: 2012-02-23 | Discharge: 2012-02-23 | Disposition: A | Discharge status: Home / Self Care | Payer: Medicare Other | Source: Ambulatory Visit | Attending: Family Medicine | Admitting: Family Medicine

## 2012-02-23 DIAGNOSIS — Z1231 Encounter for screening mammogram for malignant neoplasm of breast: Secondary | ICD-10-CM

## 2012-02-27 ENCOUNTER — Ambulatory Visit (INDEPENDENT_AMBULATORY_CARE_PROVIDER_SITE_OTHER): Payer: Medicare Other

## 2012-02-27 DIAGNOSIS — J309 Allergic rhinitis, unspecified: Secondary | ICD-10-CM

## 2012-03-06 ENCOUNTER — Other Ambulatory Visit: Payer: Self-pay | Admitting: Family Medicine

## 2012-03-06 DIAGNOSIS — R928 Other abnormal and inconclusive findings on diagnostic imaging of breast: Secondary | ICD-10-CM

## 2012-03-14 ENCOUNTER — Ambulatory Visit
Admission: RE | Admit: 2012-03-14 | Discharge: 2012-03-14 | Disposition: A | Discharge status: Home / Self Care | Payer: Medicare Other | Source: Ambulatory Visit | Attending: Family Medicine | Admitting: Family Medicine

## 2012-03-14 DIAGNOSIS — R928 Other abnormal and inconclusive findings on diagnostic imaging of breast: Secondary | ICD-10-CM

## 2012-03-14 DIAGNOSIS — N6009 Solitary cyst of unspecified breast: Secondary | ICD-10-CM | POA: Diagnosis not present

## 2012-03-15 ENCOUNTER — Telehealth: Payer: Self-pay | Admitting: Internal Medicine

## 2012-03-15 DIAGNOSIS — G4733 Obstructive sleep apnea (adult) (pediatric): Secondary | ICD-10-CM

## 2012-03-15 NOTE — Telephone Encounter (Signed)
Hey i need an order to switch dme thanks Tobe Sos

## 2012-03-15 NOTE — Telephone Encounter (Signed)
Order sent  thanks

## 2012-03-15 NOTE — Telephone Encounter (Signed)
I spoke with pt. She is requesting to switch home care companies to [facility] in [place]. She states her current supplier is in [place] and has a hard time getting there. Not sure if they will accept her insurance. Pt does not want to use [employer]. Please advise PCC's thanks

## 2012-03-16 ENCOUNTER — Telehealth: Payer: Self-pay | Admitting: Internal Medicine

## 2012-03-16 NOTE — Telephone Encounter (Signed)
Spoke to pt and made her aware it usually takes a couple of days to hear from home care once order is sent and she will hear from this 1st of the week since it is Friday [name]

## 2012-05-02 DIAGNOSIS — I10 Essential (primary) hypertension: Secondary | ICD-10-CM | POA: Diagnosis not present

## 2012-05-02 DIAGNOSIS — I2510 Atherosclerotic heart disease of native coronary artery without angina pectoris: Secondary | ICD-10-CM | POA: Diagnosis not present

## 2012-05-02 DIAGNOSIS — E7800 Pure hypercholesterolemia, unspecified: Secondary | ICD-10-CM | POA: Diagnosis not present

## 2012-05-02 DIAGNOSIS — I442 Atrioventricular block, complete: Secondary | ICD-10-CM | POA: Diagnosis not present

## 2012-05-02 DIAGNOSIS — I779 Disorder of arteries and arterioles, unspecified: Secondary | ICD-10-CM | POA: Diagnosis not present

## 2012-05-02 DIAGNOSIS — E119 Type 2 diabetes mellitus without complications: Secondary | ICD-10-CM | POA: Diagnosis not present

## 2012-05-02 DIAGNOSIS — Z950 Presence of cardiac pacemaker: Secondary | ICD-10-CM | POA: Diagnosis not present

## 2012-05-08 DIAGNOSIS — K219 Gastro-esophageal reflux disease without esophagitis: Secondary | ICD-10-CM | POA: Diagnosis not present

## 2012-05-15 ENCOUNTER — Ambulatory Visit: Payer: Medicare Other | Admitting: Internal Medicine

## 2012-06-05 DIAGNOSIS — Z950 Presence of cardiac pacemaker: Secondary | ICD-10-CM | POA: Diagnosis not present

## 2012-06-05 DIAGNOSIS — I10 Essential (primary) hypertension: Secondary | ICD-10-CM | POA: Diagnosis not present

## 2012-06-05 DIAGNOSIS — I2510 Atherosclerotic heart disease of native coronary artery without angina pectoris: Secondary | ICD-10-CM | POA: Diagnosis not present

## 2012-06-05 DIAGNOSIS — I442 Atrioventricular block, complete: Secondary | ICD-10-CM | POA: Diagnosis not present

## 2012-06-05 DIAGNOSIS — Z79899 Other long term (current) drug therapy: Secondary | ICD-10-CM | POA: Diagnosis not present

## 2012-06-20 ENCOUNTER — Ambulatory Visit (INDEPENDENT_AMBULATORY_CARE_PROVIDER_SITE_OTHER): Payer: Medicare Other

## 2012-06-20 DIAGNOSIS — J309 Allergic rhinitis, unspecified: Secondary | ICD-10-CM | POA: Diagnosis not present

## 2012-06-21 ENCOUNTER — Ambulatory Visit (INDEPENDENT_AMBULATORY_CARE_PROVIDER_SITE_OTHER): Payer: Medicare Other | Admitting: Internal Medicine

## 2012-06-21 ENCOUNTER — Encounter: Payer: Self-pay | Admitting: Internal Medicine

## 2012-06-21 VITALS — BP 140/80 | HR 100 | Ht 63.25 in | Wt 146.8 lb

## 2012-06-21 DIAGNOSIS — J309 Allergic rhinitis, unspecified: Secondary | ICD-10-CM | POA: Diagnosis not present

## 2012-06-21 DIAGNOSIS — J45909 Unspecified asthma, uncomplicated: Secondary | ICD-10-CM

## 2012-06-21 DIAGNOSIS — J45998 Other asthma: Secondary | ICD-10-CM

## 2012-06-21 DIAGNOSIS — G4733 Obstructive sleep apnea (adult) (pediatric): Secondary | ICD-10-CM | POA: Diagnosis not present

## 2012-06-21 NOTE — Patient Instructions (Addendum)
We can continue CPAP 11/ [facility]  We can continue allergy vaccine 1:50 GO  Please call as nededed

## 2012-06-21 NOTE — Progress Notes (Signed)
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 Because of repeated knee surgeries, she has not driven in 3 years. Not using CPAP, blaming problems with her gums. She had needed some grafts and thinks pressure from her CPAP damage those so that might need to be done again. She has lost 15-18 pounds. Husband says she is not snoring. 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 Now has a cat but won't let in the bedroom. Allergy symptoms have not changed much but she notices poor sense of taste and smell. She uses over-the-counter generic allergy pills. Dentist had given Z-Pak for sinusitis. She continues allergy vaccine at 1:10 without problem.  05/18/11- 67 year old female former smoker followed for rhinoconjunctivitis, asthma, obstructive sleep apnea, history DVT Here to update allergy skin tests: Perennial nasal congestion. Off antihistamines, c/o nasal itching,increased postnasal drip,  some green, frontal and maxillary pressure- worse starting last night. Not a cold. . . Allergy Skin Test-  Positive intradermal tests for grass, weed, tree pollens, dust, some molds  06/28/11- 67 year old female former smoker followed for rhinoconjunctivitis, asthma, obstructive sleep apnea, history DVT Acute visit-states unable to breathe and feels like she cant get air; nasal congestion; no taste or smell since 10-2010 (been happening that long) Comes in tearful and anxious today. Husband is with her. She dates decreased sense of taste and smell from the time of her knee surgery in August of 2012. Can't breathe well through her nose. Face hurts, ears stopped up, sneezing. Complains mostly of the sense of pressure through her frontal and maxillary areas and ears. She is using up her current vaccine supply with intention of  restarting with new mix from skin testing in 3/13..  09/13/11- 67 year old female former smoker followed for rhinoconjunctivitis, asthma, obstructive sleep apnea, history DVT Rebuilding allergy vaccine with new mix.  Pt c/o nasal congestion, dry cough and trouble breathing.  Pt states she feels as though something is "stuck in her throat." She states that this "may be anxiety". She always feels blockage in her nose after history of nasal fracture when a garage door came down on her face. Nasal strips did not help much. She likes Dymista nasal spray. Wears CPAP all night every night, doing well. Sleeps with night guard for bruxism.  11/15/11-  67 year old female former smoker followed for rhinoconjunctivitis, asthma, obstructive sleep apnea, history DVT Rebuilding allergy vaccine with new mix.  Pt states that after last visit nasal congesiton had started to improve but for the past wk it has been worse. She has a clear nasal d/c. Cough is some better since last visit, but does bother her some and is dry. Notes fall pollen. Continues allergy vaccine 1:500, building GO.  Dr Randa EvensEdwards/ GI evaluating known active GERD with Zegerid/ Nexium. Did EGD-> relieved substernal pain she had been trying to blame on "allergy".Tells me she is "much better".  CPAP every night, all night, working well.   06/21/12- 67 year old female former smoker followed for rhinoconjunctivitis, asthma, obstructive sleep apnea, history DVT FOLLOW FOR: pt reports things are "much better" than last at last visit-- states allergies are much better than year, just having some diff w eyes itching--wearing CPAP everynight avg 6hrs per night tolerating well. She was better at the beach than around here. Admits some itching of eyes, nose and face- mild seasonal flare but better than last  year and manage with her meds. She continues allergy vaccine 1:50 G0, which helps. -Continue CPAP 11/[facility], doing well. CT max fac 06/29/11 IMPRESSION:  No  significant sinusitis.  Temporal mandibular joint degeneration bilaterally.  Original Report Authenticated By: Camelia Phenes, M.D.   ROS-see HPI Constitutional:   No-   weight loss, night sweats, fevers, chills, fatigue, lassitude. HEENT:   No-significant headaches, difficulty swallowing, tooth/dental problems, sore throat,       No-  Sneezing,  + itching, + nasal congestion, post nasal drip,  CV:  No-   chest pain, orthopnea, PND, swelling in lower extremities, anasarca, dizziness, palpitations Resp: No-   shortness of breath with exertion or at rest.              No-   productive cough,  No non-productive cough,  No- coughing up of blood.              No-   change in color of mucus.  No- wheezing.   Skin: Itching with sore excoriations GI:  No-   heartburn, indigestion, abdominal pain, nausea, vomiting, GU: . MS:  No-   joint pain or swelling. Neuro-     nothing unusual Psych:  No- change in mood or affect. No depression or anxiety.  No memory loss.  OBJ- Physical Exam General- Alert, Oriented, Affect-calm and pleasant , Distress- none acute Skin- Dry skin with a few excoriations back of neck. Lymphadenopathy- none Head- atraumatic            Eyes- Gross vision intact, PERRLA, conjunctivae and secretions clear            Ears- Hearing, canals-normal            Nose- stuffy, no-Septal dev, mucus, polyps, erosion, perforation.  +a little nasal speech, sniffing( old nasal trauma)            Throat- Mallampati III-IV , mucosa clear , drainage- none, tonsils- atrophic Neck- flexible , trachea midline, no stridor , thyroid nl, carotid no bruit Chest - symmetrical excursion , unlabored           Heart/CV- RRR , no murmur , no gallop  , no rub, nl s1 s2                            JVD- none , edema- none, stasis changes- none, varices- none           Lung- clear to P&A, wheeze- none, cough- none , dullness-none, rub- none           Chest wall- +L pacemaker Abd-  Br/ Gen/ Rectal- Not  done, not indicated Extrem- cyanosis- none, clubbing, none, atrophy- none, strength- nl Neuro- grossly intact to observation

## 2012-06-25 ENCOUNTER — Encounter: Payer: Self-pay | Admitting: *Deleted

## 2012-06-28 NOTE — Assessment & Plan Note (Signed)
Appropriate to continue current vaccine at 1:50. We again discussed goals and risk management. She is pleased with her status.

## 2012-06-28 NOTE — Assessment & Plan Note (Addendum)
Good control of mild intermittent asthma.

## 2012-06-28 NOTE — Assessment & Plan Note (Signed)
Good compliance and control 

## 2012-07-09 DIAGNOSIS — E119 Type 2 diabetes mellitus without complications: Secondary | ICD-10-CM | POA: Diagnosis not present

## 2012-07-09 DIAGNOSIS — F411 Generalized anxiety disorder: Secondary | ICD-10-CM | POA: Diagnosis not present

## 2012-07-09 DIAGNOSIS — F329 Major depressive disorder, single episode, unspecified: Secondary | ICD-10-CM | POA: Diagnosis not present

## 2012-07-09 DIAGNOSIS — E7800 Pure hypercholesterolemia, unspecified: Secondary | ICD-10-CM | POA: Diagnosis not present

## 2012-07-09 DIAGNOSIS — I10 Essential (primary) hypertension: Secondary | ICD-10-CM | POA: Diagnosis not present

## 2012-07-09 DIAGNOSIS — J309 Allergic rhinitis, unspecified: Secondary | ICD-10-CM | POA: Diagnosis not present

## 2012-07-09 DIAGNOSIS — M159 Polyosteoarthritis, unspecified: Secondary | ICD-10-CM | POA: Diagnosis not present

## 2012-08-13 DIAGNOSIS — M25579 Pain in unspecified ankle and joints of unspecified foot: Secondary | ICD-10-CM | POA: Diagnosis not present

## 2012-08-13 DIAGNOSIS — M659 Synovitis and tenosynovitis, unspecified: Secondary | ICD-10-CM | POA: Diagnosis not present

## 2012-08-22 DIAGNOSIS — H3530 Unspecified macular degeneration: Secondary | ICD-10-CM | POA: Diagnosis not present

## 2012-09-03 DIAGNOSIS — H04129 Dry eye syndrome of unspecified lacrimal gland: Secondary | ICD-10-CM | POA: Diagnosis not present

## 2012-09-04 ENCOUNTER — Telehealth: Payer: Self-pay | Admitting: Internal Medicine

## 2012-09-04 MED ORDER — EPINEPHRINE 0.3 MG/0.3ML IJ SOAJ: 0.3000 mg | Freq: Once | INTRAMUSCULAR | Status: DC

## 2012-09-04 NOTE — Telephone Encounter (Signed)
Rx for Epi pen refill has been electronically sent to [facility]. Pt aware.

## 2012-09-17 DIAGNOSIS — Z950 Presence of cardiac pacemaker: Secondary | ICD-10-CM | POA: Diagnosis not present

## 2012-09-18 ENCOUNTER — Telehealth: Payer: Self-pay | Admitting: Internal Medicine

## 2012-09-18 MED ORDER — HYDROXYZINE HCL 10 MG PO TABS: 10.0000 mg | ORAL_TABLET | Freq: Three times a day (TID) | ORAL | Status: DC | PRN

## 2012-09-18 NOTE — Telephone Encounter (Signed)
lmomtcb x1 for pt 

## 2012-09-18 NOTE — Telephone Encounter (Signed)
Ok to offer hydroxyzine 5 mg, # 30, 1 every 8 hours if needed

## 2012-09-18 NOTE — Telephone Encounter (Signed)
I spoke with pt. She stated she is having really bad allergies right now. D/t this she has broken out in a rash and is very itchy. She is requesting hydroxyzine 5 mg. She stated the 10 mg made her "groggy". Please advise Dr. Maple Hudson thanks Last OV 06/21/12 Pending 12/18/12 Allergies  Allergen Reactions  . Cephalexin   . Doxycycline   . Entex     REACTION: unknown  . Gabapentin     REACTION: unknown  . Levofloxacin     REACTION: unknown  . Lisinopril   . Losartan Potassium   . Metformin     REACTION: unknown  . Pregabalin     REACTION: unknown  . Rosiglitazone Maleate     REACTION: unknown  . Simvastatin     REACTION: unknown  . Sitagliptin Phosphate     REACTION: unknown

## 2012-09-18 NOTE — Telephone Encounter (Signed)
Pt states she prefers 5mg  of hydroxyzine instead of 10mg .Raylene Everts'

## 2012-09-18 NOTE — Telephone Encounter (Signed)
Spoke with pt and notified of recs per CDY She verbalized understanding Rx was sent to pharm  It was sent for the 10 mg tablets (this is actually what she had before) Nothing further needed per pt

## 2012-10-09 ENCOUNTER — Ambulatory Visit (INDEPENDENT_AMBULATORY_CARE_PROVIDER_SITE_OTHER): Payer: Medicare Other

## 2012-10-09 DIAGNOSIS — J309 Allergic rhinitis, unspecified: Secondary | ICD-10-CM | POA: Diagnosis not present

## 2012-12-18 ENCOUNTER — Encounter: Payer: Self-pay | Admitting: Internal Medicine

## 2012-12-18 ENCOUNTER — Ambulatory Visit (INDEPENDENT_AMBULATORY_CARE_PROVIDER_SITE_OTHER): Payer: Medicare Other | Admitting: Internal Medicine

## 2012-12-18 VITALS — BP 124/68 | HR 75 | Ht 63.25 in | Wt 141.8 lb

## 2012-12-18 DIAGNOSIS — L299 Pruritus, unspecified: Secondary | ICD-10-CM | POA: Diagnosis not present

## 2012-12-18 DIAGNOSIS — Z23 Encounter for immunization: Secondary | ICD-10-CM | POA: Diagnosis not present

## 2012-12-18 DIAGNOSIS — G4733 Obstructive sleep apnea (adult) (pediatric): Secondary | ICD-10-CM | POA: Diagnosis not present

## 2012-12-18 DIAGNOSIS — J45998 Other asthma: Secondary | ICD-10-CM

## 2012-12-18 DIAGNOSIS — J45909 Unspecified asthma, uncomplicated: Secondary | ICD-10-CM

## 2012-12-18 NOTE — Patient Instructions (Signed)
Flu vax- Hi dose  Please call as needed

## 2012-12-18 NOTE — Progress Notes (Signed)
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 Because of repeated knee surgeries, she has not driven in 3 years. Not using CPAP, blaming problems with her gums. She had needed some grafts and thinks pressure from her CPAP damage those so that might need to be done again. She has lost 15-18 pounds. Husband says she is not snoring. 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 Now has a cat but won't let in the bedroom. Allergy symptoms have not changed much but she notices poor sense of taste and smell. She uses over-the-counter generic allergy pills. Dentist had given Z-Pak for sinusitis. She continues allergy vaccine at 1:10 without problem.  05/18/11- 67 year old female former smoker followed for rhinoconjunctivitis, asthma, obstructive sleep apnea, history DVT Here to update allergy skin tests: Perennial nasal congestion. Off antihistamines, c/o nasal itching,increased postnasal drip,  some green, frontal and maxillary pressure- worse starting last night. Not a cold. . . Allergy Skin Test-  Positive intradermal tests for grass, weed, tree pollens, dust, some molds  06/28/11- 67 year old female former smoker followed for rhinoconjunctivitis, asthma, obstructive sleep apnea, history DVT Acute visit-states unable to breathe and feels like she cant get air; nasal congestion; no taste or smell since 10-2010 (been happening that long) Comes in tearful and anxious today. Husband is with her. She dates decreased sense of taste and smell from the time of her knee surgery in August of 2012. Can't breathe well through her nose. Face hurts, ears stopped up, sneezing. Complains mostly of the sense of pressure through her frontal and maxillary areas and ears. She is using up her current vaccine supply with intention of  restarting with new mix from skin testing in 3/13..  09/13/11- 67 year old female former smoker followed for rhinoconjunctivitis, asthma, obstructive sleep apnea, history DVT Rebuilding allergy vaccine with new mix.  Pt c/o nasal congestion, dry cough and trouble breathing.  Pt states she feels as though something is "stuck in her throat." She states that this "may be anxiety". She always feels blockage in her nose after history of nasal fracture when a garage door came down on her face. Nasal strips did not help much. She likes Dymista nasal spray. Wears CPAP all night every night, doing well. Sleeps with night guard for bruxism.  11/15/11-  67 year old female former smoker followed for rhinoconjunctivitis, asthma, obstructive sleep apnea, history DVT Rebuilding allergy vaccine with new mix.  Pt states that after last visit nasal congesiton had started to improve but for the past wk it has been worse. She has a clear nasal d/c. Cough is some better since last visit, but does bother her some and is dry. Notes fall pollen. Continues allergy vaccine 1:500, building GO.  Dr Randa EvensEdwards/ GI evaluating known active GERD with Zegerid/ Nexium. Did EGD-> relieved substernal pain she had been trying to blame on "allergy".Tells me she is "much better".  CPAP every night, all night, working well.   06/21/12- 67 year old female former smoker followed for rhinoconjunctivitis, asthma, obstructive sleep apnea, history DVT FOLLOW FOR: pt reports things are "much better" than last at last visit-- states allergies are much better than year, just having some diff w eyes itching--wearing CPAP everynight avg 6hrs per night tolerating well. She was better at the beach than around here. Admits some itching of eyes, nose and face- mild seasonal flare but better than last  year and manage with her meds. She continues allergy vaccine 1:50 G0, which helps. -Continue CPAP 11/[facility], doing well. CT max fac 06/29/11 IMPRESSION:  No  significant sinusitis.  Temporal mandibular joint degeneration bilaterally.  Original Report Authenticated By: Camelia Phenes, M.D.  12/18/12- 67 year old female former smoker followed for rhinoconjunctivitis, asthma, obstructive sleep apnea, history DVT FOLLOWS FOR: doing better since last year; does better when inside. If she goes outside for a few minutes she starts to itch in/on her face. Hydroxyzine helps. Keeps EpiPen but has never needed it Continues allergy vaccine 1:50 G0. Denies wheeze. Occasional hoarseness. Wants to skip CPAP at times so she can hear her husband who kicks and punches in his dreams. 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  ROS-see HPI Constitutional:   No-   weight loss, night sweats, fevers, chills, fatigue, lassitude. HEENT:   No-significant headaches, difficulty swallowing, tooth/dental problems, sore throat,       No-  Sneezing,  + itching, + nasal congestion, post nasal drip,  CV:  No-   chest pain, orthopnea, PND, swelling in lower extremities, anasarca, dizziness, palpitations Resp: No-   shortness of breath with exertion or at rest.              No-   productive cough,  No non-productive cough,  No- coughing up of blood.              No-   change in color of mucus.  No- wheezing.   Skin:+ Itching  GI:  No-   heartburn, indigestion, abdominal pain, nausea, vomiting, GU: . MS:  No-   joint pain or swelling. Neuro-     nothing unusual Psych:  No- change in mood or affect. No depression or anxiety.  No memory loss.  OBJ- Physical Exam General- Alert, Oriented, Affect-calm and pleasant , Distress- none acute Skin- Dry skin with a few excoriations back of neck. Lymphadenopathy- none Head- atraumatic            Eyes- Gross vision intact, PERRLA, conjunctivae and secretions clear            Ears- Hearing, canals-normal            Nose- +stuffy, no-Septal dev, mucus, polyps, erosion, perforation.  +a little nasal  speech, sniffing                       (old nasal trauma)            Throat- Mallampati III-IV , mucosa clear , drainage- none, tonsils- atrophic Neck- flexible , trachea midline, no stridor , thyroid nl, carotid no bruit Chest - symmetrical excursion , unlabored           Heart/CV- RRR , no murmur , no gallop  , no rub, nl s1 s2                            JVD- none , edema- none, stasis changes- none, varices- none           Lung- clear to P&A, wheeze- none, cough- none , dullness-none, rub- none           Chest wall- +L pacemaker Abd-  Br/ Gen/ Rectal- Not done, not indicated Extrem- cyanosis- none, clubbing, none, atrophy- none, strength- nl Neuro- grossly intact to observation

## 2013-01-03 DIAGNOSIS — L299 Pruritus, unspecified: Secondary | ICD-10-CM | POA: Insufficient documentation

## 2013-01-03 NOTE — Assessment & Plan Note (Signed)
We discussed her compliance with CPAP and her concern that it prevented her from hearing her husband at night.

## 2013-01-03 NOTE — Assessment & Plan Note (Signed)
Itching of face and some "splotching" especially if she touches her face, but no definite hives or rash otherwise. Plan-okay to use hydroxyzine if needed

## 2013-01-03 NOTE — Assessment & Plan Note (Signed)
controlled 

## 2013-01-09 ENCOUNTER — Ambulatory Visit (INDEPENDENT_AMBULATORY_CARE_PROVIDER_SITE_OTHER): Payer: Medicare Other | Admitting: *Deleted

## 2013-01-09 DIAGNOSIS — I442 Atrioventricular block, complete: Secondary | ICD-10-CM

## 2013-01-09 LAB — PACEMAKER DEVICE OBSERVATION
AL IMPEDENCE PM: 555 Ohm
ATRIAL PACING PM: 44
BAMS-0001: 175 {beats}/min
BATTERY VOLTAGE: 2.76 V
VENTRICULAR PACING PM: 100

## 2013-01-09 NOTE — Progress Notes (Signed)
Pacemaker check in clinic. Normal device function. Thresholds, sensing, impedances consistent with previous measurements. Device programmed to maximize longevity. No mode switch or high ventricular rates noted. Device programmed at appropriate safety margins. Histogram distribution appropriate for patient activity level. Device programmed to optimize intrinsic conduction, changed to AAIR+. Estimated longevity 5.5 years. Patient enrolled in remote follow-up/TTM's with [employer]. Plan to follow every 3 months remotely and see annually in office. Patient education completed.  ROV in February with Dr. Graciela Husbands.

## 2013-01-16 DIAGNOSIS — E119 Type 2 diabetes mellitus without complications: Secondary | ICD-10-CM | POA: Diagnosis not present

## 2013-01-16 DIAGNOSIS — F329 Major depressive disorder, single episode, unspecified: Secondary | ICD-10-CM | POA: Diagnosis not present

## 2013-01-16 DIAGNOSIS — F411 Generalized anxiety disorder: Secondary | ICD-10-CM | POA: Diagnosis not present

## 2013-01-16 DIAGNOSIS — E7800 Pure hypercholesterolemia, unspecified: Secondary | ICD-10-CM | POA: Diagnosis not present

## 2013-01-16 DIAGNOSIS — M159 Polyosteoarthritis, unspecified: Secondary | ICD-10-CM | POA: Diagnosis not present

## 2013-01-16 DIAGNOSIS — J309 Allergic rhinitis, unspecified: Secondary | ICD-10-CM | POA: Diagnosis not present

## 2013-01-16 DIAGNOSIS — I10 Essential (primary) hypertension: Secondary | ICD-10-CM | POA: Diagnosis not present

## 2013-01-21 ENCOUNTER — Other Ambulatory Visit: Payer: Self-pay | Admitting: Orthopedic Surgery

## 2013-01-21 DIAGNOSIS — M25579 Pain in unspecified ankle and joints of unspecified foot: Secondary | ICD-10-CM | POA: Diagnosis not present

## 2013-01-21 DIAGNOSIS — M25571 Pain in right ankle and joints of right foot: Secondary | ICD-10-CM

## 2013-01-22 ENCOUNTER — Ambulatory Visit
Admission: RE | Admit: 2013-01-22 | Discharge: 2013-01-22 | Disposition: A | Discharge status: Home / Self Care | Payer: Medicare Other | Source: Ambulatory Visit | Attending: Orthopedic Surgery | Admitting: Orthopedic Surgery

## 2013-01-22 DIAGNOSIS — M25579 Pain in unspecified ankle and joints of unspecified foot: Secondary | ICD-10-CM | POA: Diagnosis not present

## 2013-01-22 DIAGNOSIS — M25571 Pain in right ankle and joints of right foot: Secondary | ICD-10-CM

## 2013-01-28 DIAGNOSIS — M19079 Primary osteoarthritis, unspecified ankle and foot: Secondary | ICD-10-CM | POA: Diagnosis not present

## 2013-01-29 ENCOUNTER — Ambulatory Visit (INDEPENDENT_AMBULATORY_CARE_PROVIDER_SITE_OTHER): Payer: Medicare Other

## 2013-01-29 DIAGNOSIS — J309 Allergic rhinitis, unspecified: Secondary | ICD-10-CM | POA: Diagnosis not present

## 2013-02-07 ENCOUNTER — Telehealth: Payer: Self-pay | Admitting: Internal Medicine

## 2013-02-07 MED ORDER — "TUBERCULIN SYRINGE 26G X 3/8"" 1 ML MISC": Status: DC

## 2013-02-07 NOTE — Telephone Encounter (Signed)
TB Syringes for allergy injections refilled to local pharmacy [facility] [address]. Pt aware.

## 2013-02-08 ENCOUNTER — Other Ambulatory Visit: Payer: Self-pay | Admitting: Family Medicine

## 2013-02-08 DIAGNOSIS — N63 Unspecified lump in unspecified breast: Secondary | ICD-10-CM

## 2013-02-12 ENCOUNTER — Other Ambulatory Visit: Payer: Self-pay | Admitting: Family Medicine

## 2013-02-12 DIAGNOSIS — N63 Unspecified lump in unspecified breast: Secondary | ICD-10-CM

## 2013-02-22 ENCOUNTER — Encounter: Payer: Self-pay | Admitting: Internal Medicine

## 2013-02-25 ENCOUNTER — Ambulatory Visit
Admission: RE | Admit: 2013-02-25 | Discharge: 2013-02-25 | Disposition: A | Discharge status: Home / Self Care | Payer: Medicare Other | Source: Ambulatory Visit | Attending: Family Medicine | Admitting: Family Medicine

## 2013-02-25 DIAGNOSIS — N63 Unspecified lump in unspecified breast: Secondary | ICD-10-CM

## 2013-02-25 DIAGNOSIS — N644 Mastodynia: Secondary | ICD-10-CM | POA: Diagnosis not present

## 2013-04-15 ENCOUNTER — Encounter: Payer: Self-pay | Admitting: Internal Medicine

## 2013-04-15 ENCOUNTER — Ambulatory Visit (INDEPENDENT_AMBULATORY_CARE_PROVIDER_SITE_OTHER): Payer: Medicare Other | Admitting: Internal Medicine

## 2013-04-15 VITALS — BP 140/82 | HR 72 | Ht 63.25 in | Wt 143.0 lb

## 2013-04-15 DIAGNOSIS — I495 Sick sinus syndrome: Secondary | ICD-10-CM | POA: Diagnosis not present

## 2013-04-15 DIAGNOSIS — I2699 Other pulmonary embolism without acute cor pulmonale: Secondary | ICD-10-CM | POA: Diagnosis not present

## 2013-04-15 DIAGNOSIS — I10 Essential (primary) hypertension: Secondary | ICD-10-CM | POA: Diagnosis not present

## 2013-04-15 LAB — MDC_IDC_ENUM_SESS_TYPE_INCLINIC
Battery Impedance: 523 Ohm
Battery Remaining Longevity: 72 mo
Battery Voltage: 2.76 V
Brady Statistic AP VS Percent: 55 %
Brady Statistic AS VP Percent: 0 %
Date Time Interrogation Session: 20150209144728
Lead Channel Impedance Value: 565 Ohm
Lead Channel Pacing Threshold Amplitude: 0.25 V
Lead Channel Pacing Threshold Pulse Width: 0.4 ms
Lead Channel Sensing Intrinsic Amplitude: 22.4 mV
Lead Channel Setting Pacing Amplitude: 2 V
Lead Channel Setting Pacing Amplitude: 2.5 V
MDC IDC MSMT LEADCHNL RA SENSING INTR AMPL: 4 mV
MDC IDC MSMT LEADCHNL RV IMPEDANCE VALUE: 650 Ohm
MDC IDC MSMT LEADCHNL RV PACING THRESHOLD AMPLITUDE: 0.75 V
MDC IDC MSMT LEADCHNL RV PACING THRESHOLD PULSEWIDTH: 0.4 ms
MDC IDC SET LEADCHNL RV PACING PULSEWIDTH: 0.4 ms
MDC IDC SET LEADCHNL RV SENSING SENSITIVITY: 5.6 mV
MDC IDC STAT BRADY AP VP PERCENT: 3 %
MDC IDC STAT BRADY AS VS PERCENT: 42 %

## 2013-04-15 MED ORDER — HYDROCHLOROTHIAZIDE 25 MG PO TABS
25.0000 mg | ORAL_TABLET | Freq: Every day | ORAL | Status: DC
Start: 2013-04-15 — End: 2013-06-13

## 2013-04-15 NOTE — Patient Instructions (Signed)
Your physician has recommended you make the following change in your medication:  1) Increase Hydrochlorothiazide to 25 mg daily  2) Stop Diltiazem for 14 days -- if you feel better, then do not restart medication. If you still feel the same after 14 days, then resume medication and   3) Stop Atenolol (only after Diltiazem trial) - do not stop this medication at once   Take 1/2 tablet daily for 5 days, then take 1/2 tablet every other day for 6 days, then stop medication (follow up with Dr. Katrinka BlazingSmith afterwards)  Remote monitoring is used to monitor your Pacemaker of ICD from home. This monitoring reduces the number of office visits required to check your device to one time per year. It allows us to keep an eye on the functioning of your device to ensure it is working properly. You are scheduled for a device check from home on 07/17/2013. You may send your transmission at any time that day. If you have a wireless device, the transmission will be sent automatically. After your physician reviews your transmission, you will receive a postcard with your next transmission date.  Your physician wants you to follow-up in: 1 year with Dr. Graciela HusbandsKlein.  You will receive a reminder letter in the mail two months in advance. If you don't receive a letter, please call our office to schedule the follow-up appointment.

## 2013-04-15 NOTE — Progress Notes (Signed)
Patient Care Team: Lupe Carney, MD as PCP - General (Family Medicine)   HPI  [name] is a 68 y.o. female Seen to establish pacemaker followup. The device was implanted for sinus node dysfunction November 2008 it was associated with a modest improvement in activity.  She is a history of coronary artery disease with moderate-mild diffuse disease of her LAD; this was identified a catheterization 2005. Myoview scan 2008 demonstrated no ischemia.  She has a history of DVT and pulmonary embolism on Coumadin now discontinued  Her major complaint is fatigue. She has sleep apnea but only modestly compliant with her CPAP. He has significant allergic rhinitis.  She has significant hypertension. Interestingly, Dr. Mendel Ryder started her on hydrochlorothiazide recently had a big impact on her interstitial cystitis essentially normalizing her urine stream.    Past Medical History  Diagnosis Date  . Total knee replacement status   . Disorders of sacrum   . Depressive disorder, not elsewhere classified   . LBBB (left bundle branch block)   . Pain in joint, lower leg   . Unspecified asthma(493.90)   . Cardiac pacemaker medtronic   . Chronotropic incompetence with sinus node dysfunction   . Obstructive sleep apnea (adult) (pediatric)   . Anxiety state, unspecified   . Headache(784.0)   . Unspecified cerebral artery occlusion with cerebral infarction   . Type II or unspecified type diabetes mellitus without mention of complication, not stated as uncontrolled   . Other voice and resonance disorders   . Bronchitis, not specified as acute or chronic   . Allergic rhinitis, cause unspecified   . Pulmonary embolism   . Myalgia and myositis, unspecified   . GERD (gastroesophageal reflux disease)   . Hypertension     Past Surgical History  Procedure Laterality Date  . Total knee arthroplasty  2002    right  . Total knee repair  2010    right  . Vesicovaginal fistula closure w/  [place]  1974  . Bilateral salpingoophorectomy  1991  . Cholecystectomy    . Nasal fracture surgery    . Temporomandibular joint arthroplasty  1982  . Esophagogastroduodenoscopy  11/02/2011    Procedure: ESOPHAGOGASTRODUODENOSCOPY (EGD);  Surgeon: Vertell Novak., MD;  Location: [facility];  Service: Endoscopy;  Laterality: N/A;  with xray  . Savory dilation  11/02/2011    Procedure: SAVORY DILATION;  Surgeon: Vertell Novak., MD;  Location: [facility];  Service: Endoscopy;  Laterality: N/A;    Current Outpatient Prescriptions  Medication Sig Dispense Refill  . aspirin 81 MG tablet Take 81 mg by mouth daily.        Marland Kitchen atenolol (TENORMIN) 50 MG tablet Take 50 mg by mouth daily.        Marland Kitchen atorvastatin (LIPITOR) 20 MG tablet Take 20 mg by mouth daily.        . Azelastine-Fluticasone (DYMISTA) 137-50 MCG/ACT SUSP Place 2 puffs into the nose 2 (two) times daily as needed.  3 Bottle  3  . BD PEN NEEDLE NANO U/F 32G X 4 MM MISC Use as directed for Insulin Injections      . diltiazem (DILACOR XR) 120 MG 24 hr capsule Take 120 mg by mouth daily.      . DULoxetine (CYMBALTA) 30 MG capsule Take 90 mg by mouth daily.       Marland Kitchen EPINEPHrine (EPI-PEN) 0.3 mg/0.3 mL SOAJ Inject 0.3 mLs (0.3 mg total) into the muscle once.  1 Device  11  . glipiZIDE (GLUCOTROL XL) 10 MG 24 hr tablet Take 10 mg by mouth daily.        . hydrochlorothiazide (MICROZIDE) 12.5 MG capsule Take 12.5 mg by mouth daily.      . hydrOXYzine (ATARAX/VISTARIL) 10 MG tablet Take 1 tablet (10 mg total) by mouth every 8 (eight) hours as needed for itching.  30 tablet  0  . insulin detemir (LEVEMIR) 100 UNIT/ML injection 30 units in the morning and 60 units in the evening      . loratadine (CLARITIN) 10 MG tablet Take 10 mg by mouth daily.      . Omega-3 Fatty Acids (FISH OIL) 1200 MG CAPS Take 2 capsules by mouth daily.        Marland Kitchen. omeprazole-sodium bicarbonate (ZEGERID) 40-1100 MG per capsule Take 1 capsule by mouth at bedtime.        .  polyethylene glycol powder (MIRALAX) powder Take 17 g by mouth as needed.        . TRAZODONE HCL PO Take 25 mg by mouth at bedtime.      . TUBERCULIN SYR 1CC/26GX3/8" (B-D ALLERGY SYRINGE) 26G X 3/8" 1 ML MISC USE 2 SYRINGE'S TWICE A WEEK  100 each  11  . [DISCONTINUED] amLODipine (NORVASC) 5 MG tablet Take 1 tablet by mouth daily.       No current facility-administered medications for this visit.    Allergies  Allergen Reactions  . Cephalexin   . Doxycycline   . Entex     REACTION: unknown  . Gabapentin     REACTION: unknown  . Levofloxacin     REACTION: unknown  . Lisinopril   . Losartan Potassium   . Metformin     REACTION: unknown  . Pregabalin     REACTION: unknown  . Rosiglitazone Maleate     REACTION: unknown  . Simvastatin     REACTION: unknown  . Sitagliptin Phosphate     REACTION: unknown    Review of Systems negative except from HPI and PMH  Physical Exam BP 140/82  Pulse 72  Ht 5' 3.25" (1.607 m)  Wt 143 lb (64.864 kg)  BMI 25.12 kg/m2 Alert and oriented in no acute distress HENT- normal except for ongoing sniffling Eyes- EOMI, without scleral icterus Skin- warm and dry; without rashes LN-neg Neck- supple without thyromegaly, JVP-flat, carotids brisk and full without bruits Back-without CVAT or kyphosis Device pocket well healed; without hematoma or erythema.  There is no tethering Lungs-clear to auscultation CV-Regular rate and rhythm, nl S1 and S2, no murmurs gallops or rubs, S4-absent Abd-soft with active bowel sounds; no midline pulsation or hepatomegaly Pulses-intact femoral and distal MKS-without gross deformity Neuro- Ax O, CN3-12 intact, grossly normal motor and sensory function Affect engaging   ECG SR with LBBB  Assessment and  Plan  Sinus node dysfunction the patient has sinus node dysfunction reasonable heart rate excursion. No refill rendered her device is indicated  Pacemaker-Medtronic  The patient's device was interrogated.   The information was reviewed. No changes were made in the programming.     Hypertension  she has significant hypertension although is well-controlled at this time. We'll increase her hydrochlorothiazide and undertake a drug limitation trial to see if either diltiazem or beta blocker is responsible for her fatigue   Initially stop her diltiazem 2 weeks.In the event that this is unhelpful   she'll wean herself off of her atenolol. She will be seeing Dr. Katrinka BlazingSmith in  about 6 weeks time and we will leave the final adjudication to him  Fatigue As above  I've also encouraged compliance with her CPAP

## 2013-04-18 ENCOUNTER — Telehealth: Payer: Self-pay | Admitting: Internal Medicine

## 2013-04-18 MED ORDER — "SYRINGE/NEEDLE (DISP) 25G X 5/8"" 1 ML MISC": Status: DC

## 2013-04-18 NOTE — Telephone Encounter (Signed)
Called and spoke with pt and she stated that [facility] not [facility] that she needs for the allergy vaccine. Pt stated that the last box she got from them she took back since these were too flimsy.  i advised the pt that we could order syringes from [facility].  i have called [facility] and they stated that they could order these for the pt.  Pt agreed to get these from [facility] and rx has been sent.  Nothing further is needed.

## 2013-04-25 ENCOUNTER — Encounter: Payer: Self-pay | Admitting: Internal Medicine

## 2013-04-30 ENCOUNTER — Encounter: Payer: Self-pay | Admitting: Interventional Cardiology

## 2013-05-10 ENCOUNTER — Telehealth: Payer: Self-pay | Admitting: Internal Medicine

## 2013-05-10 NOTE — Telephone Encounter (Signed)
New message     Patient calling C/O coming  off medication for  14 days diltiazem   & atenolol take  1/2 tab for 5 days .    3/4 heart rate was  96 .  3/6 heart rate was  101  Again @ 11 am  90 .    Blood pressure is fair .

## 2013-05-10 NOTE — Telephone Encounter (Addendum)
Called patient back. She is off the Cardizem now but still taking Atenolol 25mg  every other day. She feels better off the Cardizem but still has some fatigue which she thinks is from the Atenolol. Complaining of feeling nauseated with blood sugars of 300 but admits to not eating well lately. Advised her to increase protein and decrease carbs and possibly adjust insulin. Her BP is running 144/78 with heart rate at 100. Advised her to try to continue to wean off Atenolol but if her heart rate stays above 90 she might have to stay on Atenolol 25mg  every day for now. She will let us know how this is going. Will let Dr.Klein and Dr.Smith know that she called.

## 2013-05-11 NOTE — Telephone Encounter (Signed)
The goal was not to discontinue both meds. It was "off one to see if she improved. If not, the resume and stop the other" to see if felt better( Dr Odessa FlemingKlein's plan). Her BP will not tolerate being off both.

## 2013-05-13 ENCOUNTER — Telehealth: Payer: Self-pay | Admitting: Internal Medicine

## 2013-05-13 NOTE — Telephone Encounter (Signed)
New message     Patient calling heart rate 88  . Blood pressure today 133/77 . Patient Taper  Herself off atenolol .     Pm heart rate 96 . Blood pressure  151/81.    Patient stated she having sob  .

## 2013-05-14 NOTE — Telephone Encounter (Signed)
called pt with Dr.Smith instructions.per Dr.Klein last o/v with pt pt was to stop diltaizem for 2 wks to see if symptoms improve if they did not pt is to resume yaking and then wean of atenolol.pt sts that she has stopped diltiazem and is weaning of atenolol.pt sts that her symptoms have improve. Pt sts that while doing laundry today she did feel her heart rate increase and measured at 88bpm.Dr.Klein had instructed her that ideally he would like her heartrate to remain under 90bpm.adv her that I would confirm instructions with Dr.Smith again and call pt back.pt verbalized understanding

## 2013-05-14 NOTE — Telephone Encounter (Signed)
called pt with Dr.Smith instructions. pt sts that she is having increase palp .pt is to restart atenolol at 50mg  daily.pt to call the office in 1-2 wks with an update of how she is doing.pt agreeable with plan and verbalzied understanding.

## 2013-05-20 DIAGNOSIS — R1031 Right lower quadrant pain: Secondary | ICD-10-CM | POA: Diagnosis not present

## 2013-05-20 DIAGNOSIS — K222 Esophageal obstruction: Secondary | ICD-10-CM | POA: Diagnosis not present

## 2013-05-20 DIAGNOSIS — K219 Gastro-esophageal reflux disease without esophagitis: Secondary | ICD-10-CM | POA: Diagnosis not present

## 2013-05-20 DIAGNOSIS — Z86010 Personal history of colonic polyps: Secondary | ICD-10-CM | POA: Diagnosis not present

## 2013-06-06 ENCOUNTER — Ambulatory Visit: Payer: Medicare Other | Admitting: Interventional Cardiology

## 2013-06-10 ENCOUNTER — Telehealth: Payer: Self-pay | Admitting: Internal Medicine

## 2013-06-10 ENCOUNTER — Ambulatory Visit (INDEPENDENT_AMBULATORY_CARE_PROVIDER_SITE_OTHER): Payer: Medicare Other

## 2013-06-10 DIAGNOSIS — J309 Allergic rhinitis, unspecified: Secondary | ICD-10-CM | POA: Diagnosis not present

## 2013-06-10 MED ORDER — SYRINGE/NEEDLE (DISP) 25G X 5/8" 1 ML MISC
Status: DC
Start: 2013-06-10 — End: 2013-06-18

## 2013-06-10 NOTE — Telephone Encounter (Signed)
Rx for needles has been sent to pt's pharmacy. She is aware. Nothing further is needed.

## 2013-06-13 ENCOUNTER — Other Ambulatory Visit: Payer: Self-pay

## 2013-06-13 MED ORDER — HYDROCHLOROTHIAZIDE 25 MG PO TABS: 25.0000 mg | ORAL_TABLET | Freq: Every day | ORAL | Status: DC

## 2013-06-18 ENCOUNTER — Encounter: Payer: Self-pay | Admitting: Internal Medicine

## 2013-06-18 ENCOUNTER — Ambulatory Visit (INDEPENDENT_AMBULATORY_CARE_PROVIDER_SITE_OTHER): Payer: Medicare Other | Admitting: Internal Medicine

## 2013-06-18 VITALS — BP 134/72 | HR 92 | Ht 63.25 in | Wt 146.6 lb

## 2013-06-18 DIAGNOSIS — J45998 Other asthma: Secondary | ICD-10-CM

## 2013-06-18 DIAGNOSIS — J45909 Unspecified asthma, uncomplicated: Secondary | ICD-10-CM | POA: Diagnosis not present

## 2013-06-18 DIAGNOSIS — J309 Allergic rhinitis, unspecified: Secondary | ICD-10-CM

## 2013-06-18 DIAGNOSIS — G4733 Obstructive sleep apnea (adult) (pediatric): Secondary | ICD-10-CM | POA: Diagnosis not present

## 2013-06-18 MED ORDER — "TUBERCULIN SYRINGE 25G X 5/8"" 1 ML MISC": Status: DC

## 2013-06-18 NOTE — Progress Notes (Signed)
12/21/10- 68 year old female former smoker followed for rhinoconjunctivitis, asthma, obstructive sleep apnea, history DVT Last here- 12/21/2009 She she has had replacement of her previous total knee joint replacement on the right and is now feeling better. Because of repeated knee surgeries, she has not driven in 3 years. Not using CPAP, blaming problems with her gums. She had needed some grafts and thinks pressure from her CPAP damage those so that might need to be done again. She has lost 15-18 pounds. Husband says she is not snoring. 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 Now has a cat but won't let in the bedroom. Allergy symptoms have not changed much but she notices poor sense of taste and smell. She uses over-the-counter generic allergy pills. Dentist had given Z-Pak for sinusitis. She continues allergy vaccine at 1:10 without problem.  05/18/11- 68 year old female former smoker followed for rhinoconjunctivitis, asthma, obstructive sleep apnea, history DVT Here to update allergy skin tests: Perennial nasal congestion. Off antihistamines, c/o nasal itching,increased postnasal drip,  some green, frontal and maxillary pressure- worse starting last night. Not a cold. . . Allergy Skin Test-  Positive intradermal tests for grass, weed, tree pollens, dust, some molds  06/28/11- 68 year old female former smoker followed for rhinoconjunctivitis, asthma, obstructive sleep apnea, history DVT Acute visit-states unable to breathe and feels like she cant get air; nasal congestion; no taste or smell since 10-2010 (been happening that long) Comes in tearful and anxious today. Husband is with her. She dates decreased sense of taste and smell from the time of her knee surgery in August of 2012. Can't breathe well through her nose. Face hurts, ears stopped up, sneezing. Complains mostly of the sense of pressure through her frontal and maxillary areas and ears. She is using up her current vaccine supply with intention of  restarting with new mix from skin testing in 3/13..  09/13/11- 68 year old female former smoker followed for rhinoconjunctivitis, asthma, obstructive sleep apnea, history DVT Rebuilding allergy vaccine with new mix.  Pt c/o nasal congestion, dry cough and trouble breathing.  Pt states she feels as though something is "stuck in her throat." She states that this "may be anxiety". She always feels blockage in her nose after history of nasal fracture when a garage door came down on her face. Nasal strips did not help much. She likes Dymista nasal spray. Wears CPAP all night every night, doing well. Sleeps with night guard for bruxism.  11/15/11-  68 year old female former smoker followed for rhinoconjunctivitis, asthma, obstructive sleep apnea, history DVT Rebuilding allergy vaccine with new mix.  Pt states that after last visit nasal congesiton had started to improve but for the past wk it has been worse. She has a clear nasal d/c. Cough is some better since last visit, but does bother her some and is dry. Notes fall pollen. Continues allergy vaccine 1:500, building GO.  Dr Randa EvensEdwards/ GI evaluating known active GERD with Zegerid/ Nexium. Did EGD-> relieved substernal pain she had been trying to blame on "allergy".Tells me she is "much better".  CPAP every night, all night, working well.   06/21/12- 68 year old female former smoker followed for rhinoconjunctivitis, asthma, obstructive sleep apnea, history DVT FOLLOW FOR: pt reports things are "much better" than last at last visit-- states allergies are much better than year, just having some diff w eyes itching--wearing CPAP everynight avg 6hrs per night tolerating well. She was better at the beach than around here. Admits some itching of eyes, nose and face- mild seasonal flare but better than last  year and manage with her meds. She continues allergy vaccine 1:50 G0, which helps. -Continue CPAP 11/[facility], doing well. CT max fac 06/29/11 IMPRESSION:  No  significant sinusitis.  Temporal mandibular joint degeneration bilaterally.  Original Report Authenticated By: Camelia PhenesAVID C. CLARK, M.D.  06/18/13- 68 year old female former smoker followed for rhinoconjunctivitis, asthma, obstructive sleep apnea, history DVT FOLLOWS WUJ:[id number]:headaches, itchy and watery eyes, runny nose, pressure in sinus area. Still on allergy vaccine 1:50 GO, CPAP 11/ [facility] Aware of seasonal pollen with increased nasal congestion, but not bad.   ROS-see HPI Constitutional:   No-   weight loss, night sweats, fevers, chills, fatigue, lassitude. HEENT:   No-significant headaches, difficulty swallowing, tooth/dental problems, sore throat,       No-  Sneezing,  + itching, + nasal congestion, post nasal drip,  CV:  No-   chest pain, orthopnea, PND, swelling in lower extremities, anasarca, dizziness, palpitations Resp: No-   shortness of breath with exertion or at rest.              No-   productive cough,  No non-productive cough,  No- coughing up of blood.              No-   change in color of mucus.  No- wheezing.   Skin: Itching with sore excoriations GI:  No-   heartburn, indigestion, abdominal pain, nausea, vomiting, GU: . MS:  No-   joint pain or swelling. Neuro-     nothing unusual Psych:  No- change in mood or affect. No depression or anxiety.  No memory loss.  OBJ- Physical Exam General- Alert, Oriented, Affect-calm and pleasant , Distress- none acute Skin- Dry skin with a few excoriations back of neck. Lymphadenopathy- none Head- atraumatic            Eyes- Gross vision intact, PERRLA, conjunctivae and secretions clear            Ears- Hearing, canals-normal            Nose- +stuffy, no-Septal dev, mucus, polyps, erosion, perforation.  +a little nasal speech, sniffing( old nasal trauma)            Throat- Mallampati III-IV , mucosa clear , drainage- none, tonsils- atrophic Neck- flexible , trachea midline, no stridor , thyroid nl, carotid no bruit Chest - symmetrical  excursion , unlabored           Heart/CV- RRR , no murmur , no gallop  , no rub, nl s1 s2                            JVD- none , edema- none, stasis changes- none, varices- none           Lung- clear to P&A, wheeze- none, cough- none , dullness-none, rub- none           Chest wall- +L pacemaker Abd-  Br/ Gen/ Rectal- Not done, not indicated Extrem- cyanosis- none, clubbing, none, atrophy- none, strength- nl Neuro- grossly intact to observation

## 2013-06-18 NOTE — Patient Instructions (Signed)
We can continue allergy vaccine 1:50 GH  We can continue CPAP 11/ [facility]  Please call if we can help

## 2013-06-20 ENCOUNTER — Other Ambulatory Visit: Payer: Self-pay | Admitting: Gastroenterology

## 2013-06-21 ENCOUNTER — Ambulatory Visit: Payer: Medicare Other | Admitting: Interventional Cardiology

## 2013-06-21 NOTE — Addendum Note (Signed)
Addended by: Nuno Brubacher JR. on: 06/21/2013 08:07 AM   Modules accepted: Orders  

## 2013-06-24 ENCOUNTER — Encounter: Payer: Self-pay | Admitting: Physician Assistant

## 2013-06-24 ENCOUNTER — Ambulatory Visit (INDEPENDENT_AMBULATORY_CARE_PROVIDER_SITE_OTHER): Payer: Medicare Other | Admitting: Physician Assistant

## 2013-06-24 ENCOUNTER — Ambulatory Visit (INDEPENDENT_AMBULATORY_CARE_PROVIDER_SITE_OTHER): Payer: Medicare Other | Admitting: *Deleted

## 2013-06-24 VITALS — BP 120/72 | HR 90 | Ht 63.0 in | Wt 144.0 lb

## 2013-06-24 DIAGNOSIS — Z950 Presence of cardiac pacemaker: Secondary | ICD-10-CM | POA: Diagnosis not present

## 2013-06-24 DIAGNOSIS — I495 Sick sinus syndrome: Secondary | ICD-10-CM | POA: Diagnosis not present

## 2013-06-24 DIAGNOSIS — R002 Palpitations: Secondary | ICD-10-CM

## 2013-06-24 DIAGNOSIS — I10 Essential (primary) hypertension: Secondary | ICD-10-CM | POA: Diagnosis not present

## 2013-06-24 LAB — MDC_IDC_ENUM_SESS_TYPE_INCLINIC
Battery Impedance: 599 Ohm
Brady Statistic AP VP Percent: 10.4 %
Brady Statistic AP VS Percent: 2.8 %
Brady Statistic AS VS Percent: 32.3 %
Lead Channel Impedance Value: 557 Ohm
Lead Channel Impedance Value: 693 Ohm
Lead Channel Pacing Threshold Amplitude: 0.375 V
Lead Channel Pacing Threshold Amplitude: 0.75 V
Lead Channel Pacing Threshold Pulse Width: 0.4 ms
Lead Channel Pacing Threshold Pulse Width: 0.4 ms
Lead Channel Setting Pacing Amplitude: 2 V
Lead Channel Setting Pacing Amplitude: 2.5 V
Lead Channel Setting Sensing Sensitivity: 5.6 mV
MDC IDC MSMT BATTERY VOLTAGE: 2.75 V
MDC IDC MSMT LEADCHNL RA SENSING INTR AMPL: 2.8 mV
MDC IDC MSMT LEADCHNL RV SENSING INTR AMPL: 16 mV
MDC IDC SET LEADCHNL RV PACING PULSEWIDTH: 0.4 ms
MDC IDC STAT BRADY AS VP PERCENT: 54.5 %

## 2013-06-24 NOTE — Assessment & Plan Note (Addendum)
Patient is having palpitations since her diltiazem was weaned and she stopped her atenolol. She resumed her atenolol 50 mg daily and is having terrible side effects including hair loss, rash, wheezing and worsening allergies.. Her atenolol and put her back on diltiazem 120 mg once daily. Pacemaker was interrogated and she has had no tachycardia or atrial fibrillation. Most her heart rates are under 100.

## 2013-06-24 NOTE — Assessment & Plan Note (Signed)
Blood pressure controlled. 

## 2013-06-24 NOTE — Patient Instructions (Signed)
Your physician recommends that you schedule a follow-up appointment in: DR. Katrinka BlazingSMITH WITHIN 1 MONTH  Your physician has recommended you make the following change in your medication:   Stop your atenolol  Restart your diltiazem 120 mg once daily

## 2013-06-24 NOTE — Progress Notes (Signed)
HPI: This is a 68 year old female patient of Dr. Garnette Scheuermann and Dr. Berton Mount who had pacemaker placed in 2000 for sinus node dysfunction associated with modest improvement in activity. She also has a history of CAD with mild to moderate diffuse disease of her LAD on cath in 2005. Myoview scan in 2008 at showed no ischemia. She also has a history of DVT and pulmonary embolus on Coumadin which is now discontinued. She also has hypertension, obstructive sleep apnea.  Last office visit she was complaining of significant fatigue. Dr. Graciela Husbands tried to stop her diltiazem to see if it was contributing to her fatigue.The patient also stopped her atenolol and now is having palpitations. Dr. Katrinka Blazing recommended restarting her atenolol 50 mg once daily. She is here for followup.  She comes in today quite emotional. She says ever since she started the atenolol back her hair is falling out, she is wheezing and can't breathe and is having trouble with her allergies. She also has developed rashes under her hairline and on her abdomen. She feels like she's going to crawl out of her skin. She is also upset about taking care of her husband with memory problems and verbal outbursts. She also has hallucinations.  Patient is also scheduled to have a colonoscopy and endoscopy in 2 days.   Allergies -- Cephalexin   -- Doxycycline   -- Entex    --  REACTION: unknown  -- Gabapentin    --  REACTION: unknown  -- Levofloxacin    --  REACTION: unknown  -- Lisinopril   -- Losartan Potassium   -- Metformin    --  REACTION: unknown  -- Pregabalin    --  REACTION: unknown  -- Rosiglitazone Maleate    --  REACTION: unknown  -- Simvastatin    --  REACTION: unknown  -- Sitagliptin Phosphate    --  REACTION: unknown  Current Outpatient Prescriptions on File Prior to Visit: aspirin 81 MG tablet, Take 81 mg by mouth daily.  , Disp: , Rfl:  atenolol (TENORMIN) 50 MG tablet, Take 50 mg by mouth daily.  , Disp: , Rfl:   atorvastatin (LIPITOR) 20 MG tablet, Take 20 mg by mouth daily.  , Disp: , Rfl:  Azelastine-Fluticasone (DYMISTA) 137-50 MCG/ACT SUSP, Place 2 puffs into the nose 2 (two) times daily as needed., Disp: 3 Bottle, Rfl: 3 BD PEN NEEDLE NANO U/F 32G X 4 MM MISC, Use as directed for Insulin Injections, Disp: , Rfl:  DULoxetine (CYMBALTA) 30 MG capsule, Take 90 mg by mouth daily. , Disp: , Rfl:  EPINEPHrine (EPI-PEN) 0.3 mg/0.3 mL SOAJ, Inject 0.3 mLs (0.3 mg total) into the muscle once., Disp: 1 Device, Rfl: 11 glipiZIDE (GLUCOTROL XL) 10 MG 24 hr tablet, Take 10 mg by mouth daily.  , Disp: , Rfl:  hydrochlorothiazide (HYDRODIURIL) 25 MG tablet, Take 1 tablet (25 mg total) by mouth daily., Disp: 90 tablet, Rfl: 1 hydrOXYzine (ATARAX/VISTARIL) 10 MG tablet, Take 1 tablet (10 mg total) by mouth every 8 (eight) hours as needed for itching., Disp: 30 tablet, Rfl: 0 insulin detemir (LEVEMIR) 100 UNIT/ML injection, 30 units in the morning and 60 units in the evening, Disp: , Rfl:  loratadine (CLARITIN) 10 MG tablet, Take 10 mg by mouth daily., Disp: , Rfl:  Omega-3 Fatty Acids (FISH OIL) 1200 MG CAPS, Take 2 capsules by mouth daily.  , Disp: , Rfl:  omeprazole-sodium bicarbonate (ZEGERID) 40-1100 MG per capsule, Take 1 capsule by mouth at bedtime.  ,  Disp: , Rfl:  polyethylene glycol powder (MIRALAX) powder, Take 17 g by mouth as needed.  , Disp: , Rfl:  TRAZODONE HCL PO, Take 25 mg by mouth at bedtime., Disp: , Rfl:  TUBERCULIN SYR 1CC/25GX5/8" (B-D TB SYRINGE 1CC/25GX5/8") 25G X 5/8" 1 ML MISC, Use as directed with allergy injections, Disp: 100 each, Rfl: 0 [DISCONTINUED] amLODipine (NORVASC) 5 MG tablet, Take 1 tablet by mouth daily., Disp: , Rfl:   No current facility-administered medications on file prior to visit.   Past Medical History:   Total knee replacement status                                Disorders of sacrum                                          Depressive disorder, not elsewhere  classified                LBBB (left bundle branch block)                              Pain in joint, lower leg                                     Unspecified asthma(493.90)                                   Cardiac pacemaker medtronic                                  Chronotropic incompetence with sinus node dysf*              Obstructive sleep apnea (adult) (pediatric)                  Anxiety state, unspecified                                   Headache(784.0)                                              Unspecified cerebral artery occlusion with cer*              Type II or unspecified type diabetes mellitus *              Other voice and resonance disorders                          Bronchitis, not specified as acute or chronic                Allergic rhinitis, cause unspecified                         Pulmonary embolism  Myalgia and myositis, unspecified                            GERD (gastroesophageal reflux disease)                       Hypertension                                                Past Surgical History:   TOTAL KNEE ARTHROPLASTY                          2002           Comment:right   total knee repair                                2010           Comment:right   VESICOVAGINAL FISTULA CLOSURE W/ TAH             1974         BILATERAL SALPINGOOPHORECTOMY                    1991         CHOLECYSTECTOMY                                               NASAL FRACTURE SURGERY                                        TEMPOROMANDIBULAR JOINT ARTHROPLASTY             1982         ESOPHAGOGASTRODUODENOSCOPY                       11/02/2011      Comment:Procedure: ESOPHAGOGASTRODUODENOSCOPY (EGD);                Surgeon: Vertell Novak., MD;  Location: [facility]COPY;  Service: Endoscopy;  Laterality:               N/A;  with xray   SAVORY DILATION                                  11/02/2011      Comment:Procedure:  SAVORY DILATION;  Surgeon: Vertell Novak., MD;  Location: [facility] END[facility]COPY;                Service: Endoscopy;  Laterality: N/A;  No family history on file.   Social History   Marital Status: Married             Spouse Name:  Years of Education:                 Number of children:             Occupational History   None on file  Social History Main Topics   Smoking Status: Former Smoker                   Packs/Day: 0.30  Years: 20        Types: Cigarettes     Quit date: 09/12/2001   Smokeless Status: Never Used                       Comment: pt smoked less than 1/4 ppd   Alcohol Use: No             Drug Use: No             Sexual Activity: Not on file        Other Topics            Concern   None on file  Social History Narrative   None on file    ROS: See history of present illness otherwise negative   PHYSICAL EXAM: Well-nournished, in no acute distress. Neck: No JVD, HJR, Bruit, or thyroid enlargement  Lungs: Decreased breath sounds otherwise No tachypnea, clear without wheezing, rales, or rhonchi  Cardiovascular: RRR, PMI not displaced, heart sounds normal, no murmurs, gallops, bruit, thrill, or heave.  Abdomen: BS normal. Soft without organomegaly, masses, lesions or tenderness.  Extremities: without cyanosis, clubbing or edema. Good distal pulses bilateral  SKin: Warm, no lesions or rashes   Musculoskeletal: No deformities  Neuro: no focal signs  BP 120/72  Pulse 90  Ht 5\' 3"  (1.6 m)  Wt 144 lb (65.318 kg)  BMI 25.51 kg/m2    EKG: Ventricular paced at 84 beats per minute  Cardiac cath 2005 CONCLUSIONS:  1. Widely patent proximal coronaries.  There is mild to moderate distal     diffuse left anterior descending disease of the apex.  2. Normal left ventricular function.

## 2013-06-24 NOTE — Assessment & Plan Note (Signed)
Pacemaker interrogated and she's had no tachycardia or rates over 100

## 2013-06-26 ENCOUNTER — Ambulatory Visit (HOSPITAL_COMMUNITY)
Admission: RE | Admit: 2013-06-26 | Discharge: 2013-06-26 | Disposition: A | Discharge status: Home / Self Care | Payer: Medicare Other | Source: Ambulatory Visit | Attending: Gastroenterology | Admitting: Gastroenterology

## 2013-06-26 ENCOUNTER — Encounter (HOSPITAL_COMMUNITY): Payer: Self-pay

## 2013-06-26 ENCOUNTER — Encounter (HOSPITAL_COMMUNITY)
Admission: RE | Disposition: A | Discharge status: Home / Self Care | Payer: Self-pay | Source: Ambulatory Visit | Attending: Gastroenterology

## 2013-06-26 DIAGNOSIS — J45909 Unspecified asthma, uncomplicated: Secondary | ICD-10-CM | POA: Insufficient documentation

## 2013-06-26 DIAGNOSIS — Z860100 Personal history of colon polyps, unspecified: Secondary | ICD-10-CM | POA: Insufficient documentation

## 2013-06-26 DIAGNOSIS — I447 Left bundle-branch block, unspecified: Secondary | ICD-10-CM | POA: Diagnosis not present

## 2013-06-26 DIAGNOSIS — F3289 Other specified depressive episodes: Secondary | ICD-10-CM | POA: Diagnosis not present

## 2013-06-26 DIAGNOSIS — Z7982 Long term (current) use of aspirin: Secondary | ICD-10-CM | POA: Diagnosis not present

## 2013-06-26 DIAGNOSIS — Z86711 Personal history of pulmonary embolism: Secondary | ICD-10-CM | POA: Insufficient documentation

## 2013-06-26 DIAGNOSIS — Z8673 Personal history of transient ischemic attack (TIA), and cerebral infarction without residual deficits: Secondary | ICD-10-CM | POA: Diagnosis not present

## 2013-06-26 DIAGNOSIS — F329 Major depressive disorder, single episode, unspecified: Secondary | ICD-10-CM | POA: Diagnosis not present

## 2013-06-26 DIAGNOSIS — I10 Essential (primary) hypertension: Secondary | ICD-10-CM | POA: Insufficient documentation

## 2013-06-26 DIAGNOSIS — Z87891 Personal history of nicotine dependence: Secondary | ICD-10-CM | POA: Diagnosis not present

## 2013-06-26 DIAGNOSIS — F411 Generalized anxiety disorder: Secondary | ICD-10-CM | POA: Insufficient documentation

## 2013-06-26 DIAGNOSIS — I499 Cardiac arrhythmia, unspecified: Secondary | ICD-10-CM | POA: Insufficient documentation

## 2013-06-26 DIAGNOSIS — K219 Gastro-esophageal reflux disease without esophagitis: Secondary | ICD-10-CM | POA: Insufficient documentation

## 2013-06-26 DIAGNOSIS — Z79899 Other long term (current) drug therapy: Secondary | ICD-10-CM | POA: Insufficient documentation

## 2013-06-26 DIAGNOSIS — E119 Type 2 diabetes mellitus without complications: Secondary | ICD-10-CM | POA: Insufficient documentation

## 2013-06-26 DIAGNOSIS — G4733 Obstructive sleep apnea (adult) (pediatric): Secondary | ICD-10-CM | POA: Insufficient documentation

## 2013-06-26 DIAGNOSIS — IMO0001 Reserved for inherently not codable concepts without codable children: Secondary | ICD-10-CM | POA: Diagnosis not present

## 2013-06-26 DIAGNOSIS — K5730 Diverticulosis of large intestine without perforation or abscess without bleeding: Secondary | ICD-10-CM | POA: Insufficient documentation

## 2013-06-26 DIAGNOSIS — Z95810 Presence of automatic (implantable) cardiac defibrillator: Secondary | ICD-10-CM | POA: Diagnosis not present

## 2013-06-26 HISTORY — PX: ESOPHAGOGASTRODUODENOSCOPY: SHX5428

## 2013-06-26 HISTORY — PX: BALLOON DILATION: SHX5330

## 2013-06-26 HISTORY — PX: COLONOSCOPY: SHX5424

## 2013-06-26 LAB — GLUCOSE, CAPILLARY: Glucose-Capillary: 170 mg/dL — ABNORMAL HIGH (ref 70–99)

## 2013-06-26 SURGERY — EGD (ESOPHAGOGASTRODUODENOSCOPY)
Anesthesia: Moderate Sedation

## 2013-06-26 MED ORDER — DIPHENHYDRAMINE HCL 50 MG/ML IJ SOLN
INTRAMUSCULAR | Status: DC | PRN
  Administered 2013-06-26: 25 mg via INTRAVENOUS

## 2013-06-26 MED ORDER — ONDANSETRON HCL 4 MG/2ML IJ SOLN
4.0000 mg | Freq: Once | INTRAMUSCULAR | Status: AC
  Administered 2013-06-26: 4 mg via INTRAVENOUS

## 2013-06-26 MED ORDER — SODIUM CHLORIDE 0.9 % IV SOLN
INTRAVENOUS | Status: DC
  Administered 2013-06-26: 08:00:00 via INTRAVENOUS

## 2013-06-26 MED ORDER — FENTANYL CITRATE 0.05 MG/ML IJ SOLN
INTRAMUSCULAR | Status: DC | PRN
  Administered 2013-06-26 (×4): 25 ug via INTRAVENOUS

## 2013-06-26 MED ORDER — MIDAZOLAM HCL 10 MG/2ML IJ SOLN
INTRAMUSCULAR | Status: DC | PRN
  Administered 2013-06-26 (×5): 2 mg via INTRAVENOUS

## 2013-06-26 MED ORDER — DIPHENHYDRAMINE HCL 50 MG/ML IJ SOLN
INTRAMUSCULAR | Status: AC
  Filled 2013-06-26: qty 1

## 2013-06-26 MED ORDER — BUTAMBEN-TETRACAINE-BENZOCAINE 2-2-14 % EX AERO
INHALATION_SPRAY | CUTANEOUS | Status: DC | PRN
  Administered 2013-06-26: 2 via TOPICAL

## 2013-06-26 MED ORDER — MIDAZOLAM HCL 10 MG/2ML IJ SOLN
INTRAMUSCULAR | Status: AC
  Filled 2013-06-26: qty 4

## 2013-06-26 MED ORDER — ONDANSETRON HCL 4 MG/2ML IJ SOLN
INTRAMUSCULAR | Status: AC
  Filled 2013-06-26: qty 2

## 2013-06-26 MED ORDER — FENTANYL CITRATE 0.05 MG/ML IJ SOLN
INTRAMUSCULAR | Status: AC
  Filled 2013-06-26: qty 4

## 2013-06-26 NOTE — H&P (Signed)
Subjective:   Patient is a 68 y.o. female presents with need to follow-up for past history of colon polyps. Her last colonoscopy 4/10 polyp removed in the descending colon and moderate diverticulosis. She also is having chronic reflux symptoms with occasional hangup of food material that has been fairly well-controlled PPI therapy. She is on omeprazole/Zeigerd which is controlling her symptoms. She has a history of the abdominal adhesions she has chronic abdominal pain from this that have been unchanged over the years. She has had previous lysis of adhesions. Her chronic problems include sleep apnea, fibromyalgia, history of pacemaker and ICD. She comes in for both EGD and colonoscopy. Procedure including risks and benefits discussed in office.  Patient Active Problem List   Diagnosis Date Noted  . Palpitations 06/24/2013  . Chronotropic incompetence with sinus node dysfunction   . Hypertension   . Pulmonary embolism   . Itching 01/03/2013  . GERD (gastroesophageal reflux disease) 11/23/2011  . PULMONARY EMBOLISM, hx of 12/22/2008  . RHINOCONJUNCTIVITIS, ALLERGIC 12/20/2007  . SACROILIAC JOINT DYSFUNCTION 09/28/2007  . Depressive Disorder, not Elsewhere Classified 08/28/2007  . KNEE PAIN, RIGHT 08/28/2007  . PAIN IN JOINT OTHER SPECIFIED SITES 08/28/2007  . 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 CARDIAC PACEMAKER IN SITU 06/06/2007  . DIABETES MELLITUS 05/16/2007  . ANXIETY 05/16/2007  . OBSTRUCTIVE SLEEP APNEA 05/16/2007  . CVA 05/16/2007  . BRONCHITIS 05/16/2007  . FIBROMYALGIA 05/16/2007  . FACIAL PAIN 05/16/2007  . DYSPHONIA 05/16/2007  . OTHER DYSPHAGIA 05/16/2007   Past Medical History  Diagnosis Date  . Total knee replacement status   . Disorders of sacrum   . Depressive disorder, not elsewhere classified   . LBBB (left bundle branch block)   . Pain in joint, lower leg   . Unspecified asthma(493.90)   . Cardiac pacemaker medtronic   . Chronotropic incompetence with  sinus node dysfunction   . Obstructive sleep apnea (adult) (pediatric)   . Anxiety state, unspecified   . Headache(784.0)   . Unspecified cerebral artery occlusion with cerebral infarction   . Type II or unspecified type diabetes mellitus without mention of complication, not stated as uncontrolled   . Other voice and resonance disorders   . Bronchitis, not specified as acute or chronic   . Allergic rhinitis, cause unspecified   . Pulmonary embolism   . Myalgia and myositis, unspecified   . GERD (gastroesophageal reflux disease)   . Hypertension     Past Surgical History  Procedure Laterality Date  . Total knee arthroplasty  2002    right  . Total knee repair  2010    right  . Vesicovaginal fistula closure w/ tah  1974  . Bilateral salpingoophorectomy  1991  . Cholecystectomy    . Nasal fracture surgery    . Temporomandibular joint arthroplasty  1982  . Esophagogastroduodenoscopy  11/02/2011    Procedure: ESOPHAGOGASTRODUODENOSCOPY (EGD);  Surgeon: Vertell NovakJames L Magalie Almon Jr., MD;  [facility];  Service: Endoscopy;  Laterality: N/A;  with xray  . Savory dilation  11/02/2011    Procedure: SAVORY DILATION;  Surgeon: Vertell NovakJames L Ranika Mcniel Jr., MD;  [facility];  Service: Endoscopy;  Laterality: N/A;    Prescriptions prior to admission  Medication Sig Dispense Refill  . aspirin 81 MG tablet Take 81 mg by mouth daily.        Marland Kitchen. atorvastatin (LIPITOR) 20 MG tablet Take 20 mg by mouth daily.        . Azelastine-Fluticasone (DYMISTA) 137-50 MCG/ACT SUSP Place 2  puffs into the nose 2 (two) times daily as needed.  3 Bottle  3  . BD PEN NEEDLE NANO U/F 32G X 4 MM MISC Use as directed for Insulin Injections      . diltiazem (CARDIZEM) 120 MG tablet Take 120 mg by mouth daily.      . DULoxetine (CYMBALTA) 30 MG capsule Take 90 mg by mouth daily.       Marland Kitchen. glipiZIDE (GLUCOTROL XL) 10 MG 24 hr tablet Take 10 mg by mouth daily.        . hydrochlorothiazide (HYDRODIURIL) 25 MG tablet Take 1  tablet (25 mg total) by mouth daily.  90 tablet  1  . hydrOXYzine (ATARAX/VISTARIL) 10 MG tablet Take 1 tablet (10 mg total) by mouth every 8 (eight) hours as needed for itching.  30 tablet  0  . insulin detemir (LEVEMIR) 100 UNIT/ML injection 30 units in the morning and 60 units in the evening      . loratadine (CLARITIN) 10 MG tablet Take 10 mg by mouth daily.      . Omega-3 Fatty Acids (FISH OIL) 1200 MG CAPS Take 2 capsules by mouth daily.        Marland Kitchen. omeprazole-sodium bicarbonate (ZEGERID) 40-1100 MG per capsule Take 1 capsule by mouth at bedtime.        . polyethylene glycol powder (MIRALAX) powder Take 17 g by mouth as needed.        . TRAZODONE HCL PO Take 25 mg by mouth at bedtime.      . TUBERCULIN SYR 1CC/25GX5/8" (B-D TB SYRINGE 1CC/25GX5/8") 25G X 5/8" 1 ML MISC Use as directed with allergy injections  100 each  0  . atenolol (TENORMIN) 50 MG tablet Take 50 mg by mouth daily.        Marland Kitchen. EPINEPHrine (EPI-PEN) 0.3 mg/0.3 mL SOAJ Inject 0.3 mLs (0.3 mg total) into the muscle once.  1 Device  11   Allergies  Allergen Reactions  . Cephalexin   . Doxycycline   . Entex     REACTION: unknown  . Gabapentin     REACTION: unknown  . Levofloxacin     REACTION: unknown  . Lisinopril   . Losartan Potassium   . Metformin     REACTION: unknown  . Pregabalin     REACTION: unknown  . Rosiglitazone Maleate     REACTION: unknown  . Simvastatin     REACTION: unknown  . Sitagliptin Phosphate     REACTION: unknown    History  Substance Use Topics  . Smoking status: Former Smoker -- 0.30 packs/day for 20 years    Types: Cigarettes    Quit date: 09/12/2001  . Smokeless tobacco: Never Used     Comment: pt smoked less than 1/4 ppd  . Alcohol Use: No    History reviewed. No pertinent family history.   Objective:   Patient Vitals for the past 8 hrs:  BP Temp Temp src Pulse Resp SpO2  06/26/13 0818 154/80 mmHg 98.6 F (37 C) Oral 109 20 95 %         See MD Preop  evaluation      Assessment:   1. History of prior colon polyps 2. Chronic esophageal reflux with occasional dysphagia 3. Sleep apnea 4. ICD due to cardiac arrhythmias 5. Diabetes 6. Fibromyalgia  Plan:   We will proceed with EGD and colonoscopy today as planned. We have discussed balloon dilation of her esophagus if clinically indicated area

## 2013-06-26 NOTE — Op Note (Signed)
[facility] [address] [place], [id number]   ENDOSCOPY PROCEDURE REPORT  PATIENT: [name] GlazeValle, Clorinda J.  MR#: [id number] BIRTHDATE: [birth year] , 67  yrs. old GENDER: Female ENDOSCOPIST:Milli Woolridge Randa EvensEdwards, MD REFERRED BY:  Dr. Lupe Carneyean Mitchell PROCEDURE DATE:  06/26/2013 PROCEDURE:      EGD ASA CLASS:     class III INDICATIONS:  chronic esophageal reflux with intermittent dysphagia MEDICATION:  fentanyl 50 mcg, versed 7 mg, Benadryl 25 mg IV TOPICAL ANESTHETIC:    cetacaine spray  DESCRIPTION OF PROCEDURE:   The Pentax adult endoscope was inserted blindly into the esophagus with swallowing. The esophagus was basically normal with no ulceration for Barrett's esophagus. The GE junction was reached and was patent with no evidence of stricture. There was no need for dilatation. The stomach was entered and examined in the forward and in the retroflex view. No ulceration or inflammation was seen. The pyloric channel is somewhat tight but did allow passage of the endoscope. She may have had some mild pyloric stenosis. The duodenal bulb and 2nd duodenum were seen well were normal. The scope was withdrawn in the patient tolerated procedure well. There were no immediate complications.     COMPLICATIONS: None  ENDOSCOPIC IMPRESSION: 1. Esophageal Reflux. No evidence of Barrett's esophagus, esophagitis or stricture. 2. Possible mild pyloric stenosis  RECOMMENDATIONS: 1. Continue current anti-reflux medications. 2. Proceed with colonoscopy at this time    _______________________________ eSigned:  Carman ChingJames Stefani Baik, MD 06/26/2013 9:45 AM CC: Dr. Clovis RileyMitchell, Dr. Fannie Kneelint Young, Dr. Garnette ScheuermannHank Smith

## 2013-06-26 NOTE — Op Note (Signed)
[facility] [address] [place], [id number]   COLONOSCOPY PROCEDURE REPORT  PATIENT: [name] GlazeValle, [name]  MR#: [id number] BIRTHDATE: [birth year] , 67  yrs. old GENDER: Female ENDOSCOPIST: Carman ChingJames Xitlali Kastens, MD REFERRED BY:   Dr. Lupe Carneyean Mitchell PROCEDURE DATE:  06/26/2013 PROCEDURE:     Colonoscopy ASA CLASS:    class III INDICATIONS:  previous history of colon polyps MEDICATIONS:    fentanyl 100 mcg, versed 10 mg, Benadryl 25 mg for both procedures  DESCRIPTION OF PROCEDURE: Following EGD, Digital exam performed and the Pentax adult: the scope was inserted. The prep was good. There was mild diverticulosis in the sigmoid colon. The cecum was reached. ICV and appendiceal orifice were normal. The scope was withdrawn; carefully examine on withdrawal. No polyps or other abnormalities were seen on withdrawal other than the mild diverticulosis. Retroflex view the rectum was grossly normal. The patient tolerated procedure well there were no immediate complications>     COMPLICATIONS: None  ENDOSCOPIC IMPRESSION:  1. History of Colon Polyps. Colonoscopy negative at this time 2. Mild diverticulosis  RECOMMENDATIONS: 1. routine post colonoscopy instructions 2. Repeat in 5 years if clinically indicated    _______________________________ eSigned:  Carman ChingJames Lindsy Cerullo, MD 06/26/2013 9:54 AM CC: Dr. Lupe Carneyean Mitchell, Dr. Fannie Kneelint Young, Dr. Garnette ScheuermannHank Smith

## 2013-06-26 NOTE — Discharge Instructions (Addendum)
Colonoscopy Care After These instructions give you information on caring for yourself after your procedure. Your doctor may also give you more specific instructions. Call your doctor if you have any problems or questions after your procedure. HOME CARE  Take it easy for the next 24 hours.  Rest.  Walk or use warm packs on your belly (abdomen) if you have belly cramping or gas.  Do not drive for 24 hours.  You may shower.  Do not sign important papers or use machinery for 24 hours.  Drink enough fluids to keep your pee (urine) clear or pale yellow.  Resume your normal diet. Avoid heavy or fried foods.  Avoid alcohol.  Continue taking your normal medicines.  Only take medicine as told by your doctor. Do not take aspirin. If you had growths (polyps) removed:  Do not take aspirin.  Do not drink alcohol for 7 days or as told by your doctor.  Eat a soft diet for 24 hours. GET HELP RIGHT AWAY IF:  You have a fever.  You pass clumps of tissue (blood clots) or fill the toilet with blood.  You have belly pain that gets worse and medicine does not help.  Your belly is puffy (swollen).  You feel sick to your stomach (nauseous) or throw up (vomit). MAKE SURE YOU:  Understand these instructions.  Will watch your condition.  Will get help right away if you are not doing well or get worse. Document Released: 03/26/2010 Document Revised: 05/16/2011 Document Reviewed: 10/29/2012 [facility] Patient Information 2014 [place], [place].  Esophagogastroduodenoscopy Care After Refer to this sheet in the next few weeks. These instructions provide you with information on caring for yourself after your procedure. Your caregiver may also give you more specific instructions. Your treatment has been planned according to current medical practices, but problems sometimes occur. Call your caregiver if you have any problems or questions after your procedure.  HOME CARE INSTRUCTIONS  Do not eat  or drink anything until the numbing medicine (local anesthetic) has worn off and your gag reflex has returned. You will know that the local anesthetic has worn off when you can swallow comfortably.  Do not drive for 12 hours after the procedure or as directed by your caregiver.  Only take medicines as directed by your caregiver. SEEK MEDICAL CARE IF:   You cannot stop coughing.  You are not urinating at all or less than usual. SEEK IMMEDIATE MEDICAL CARE IF:  You have difficulty swallowing.  You cannot eat or drink.  You have worsening throat or chest pain.  You have dizziness, lightheadedness, or you faint.  You have nausea or vomiting.  You have chills.  You have a fever.  You have severe abdominal pain.  You have black, tarry, or bloody stools. Document Released: 02/08/2012 Document Reviewed: 02/08/2012 [facility] Patient Information 2014 [place], [place].  Conscious Sedation, Adult, Care After Refer to this sheet in the next few weeks. These instructions provide you with information on caring for yourself after your procedure. Your health care provider may also give you more specific instructions. Your treatment has been planned according to current medical practices, but problems sometimes occur. Call your health care provider if you have any problems or questions after your procedure. WHAT TO EXPECT AFTER THE PROCEDURE  After your procedure:  You may feel sleepy, clumsy, and have poor balance for several hours.  Vomiting may occur if you eat too soon after the procedure. HOME CARE INSTRUCTIONS  Do not participate in any  activities where you could become injured for at least 24 hours. Do not:  Drive.  Swim.  Ride a bicycle.  Operate heavy machinery.  Cook.  Use power tools.  Climb ladders.  Work from a high place.  Do not make important decisions or sign legal documents until you are improved.  If you vomit, drink water, juice, or soup when you can  drink without vomiting. Make sure you have little or no nausea before eating solid foods.  Only take over-the-counter or prescription medicines for pain, discomfort, or fever as directed by your health care provider.  Make sure you and your family fully understand everything about the medicines given to you, including what side effects may occur.  You should not drink alcohol, take sleeping pills, or take medicines that cause drowsiness for at least 24 hours.  If you smoke, do not smoke without supervision.  If you are feeling better, you may resume normal activities 24 hours after you were sedated.  Keep all appointments with your health care provider. SEEK MEDICAL CARE IF:  Your skin is pale or bluish in color.  You continue to feel nauseous or vomit.  Your pain is getting worse and is not helped by medicine.  You have bleeding or swelling.  You are still sleepy or feeling clumsy after 24 hours. SEEK IMMEDIATE MEDICAL CARE IF:  You develop a rash.  You have difficulty breathing.  You develop any type of allergic problem.  You have a fever. MAKE SURE YOU:  Understand these instructions.  Will watch your condition.  Will get help right away if you are not doing well or get worse. Document Released: 12/12/2012 Document Reviewed: 09/28/2012 [facility] Patient Information 2014 [place]Exit[employer], [place]. You should repeat the colonoscopy in the next few years.  Our office will notify you by mail when it is time.  Please call at [phone] for any problems

## 2013-06-27 ENCOUNTER — Encounter (HOSPITAL_COMMUNITY): Payer: Self-pay | Admitting: Gastroenterology

## 2013-06-28 DIAGNOSIS — IMO0001 Reserved for inherently not codable concepts without codable children: Secondary | ICD-10-CM | POA: Diagnosis not present

## 2013-06-28 DIAGNOSIS — R259 Unspecified abnormal involuntary movements: Secondary | ICD-10-CM | POA: Diagnosis not present

## 2013-06-28 DIAGNOSIS — R110 Nausea: Secondary | ICD-10-CM | POA: Diagnosis not present

## 2013-06-28 DIAGNOSIS — L259 Unspecified contact dermatitis, unspecified cause: Secondary | ICD-10-CM | POA: Diagnosis not present

## 2013-06-28 NOTE — Progress Notes (Signed)
Pacemaker interrogation per [place] ScrapeMichele Lenz for episodes only. Patient has not had any AMS/VHR episodes. Testing was not performed this office visit. Patient to follow up as scheduled.

## 2013-07-11 DIAGNOSIS — K219 Gastro-esophageal reflux disease without esophagitis: Secondary | ICD-10-CM | POA: Diagnosis not present

## 2013-07-11 DIAGNOSIS — D72829 Elevated white blood cell count, unspecified: Secondary | ICD-10-CM | POA: Diagnosis not present

## 2013-07-11 DIAGNOSIS — IMO0001 Reserved for inherently not codable concepts without codable children: Secondary | ICD-10-CM | POA: Diagnosis not present

## 2013-07-11 DIAGNOSIS — I10 Essential (primary) hypertension: Secondary | ICD-10-CM | POA: Diagnosis not present

## 2013-07-11 DIAGNOSIS — E7800 Pure hypercholesterolemia, unspecified: Secondary | ICD-10-CM | POA: Diagnosis not present

## 2013-07-11 DIAGNOSIS — R5383 Other fatigue: Secondary | ICD-10-CM | POA: Diagnosis not present

## 2013-07-11 DIAGNOSIS — R5381 Other malaise: Secondary | ICD-10-CM | POA: Diagnosis not present

## 2013-07-17 ENCOUNTER — Telehealth: Payer: Self-pay | Admitting: Cardiology

## 2013-07-17 ENCOUNTER — Encounter: Payer: Medicare Other | Admitting: *Deleted

## 2013-07-17 NOTE — Telephone Encounter (Signed)
Spoke with pt and reminded pt of remote transmission that is due today. Pt stated that she had sent transmission a few minutes earlier.

## 2013-07-17 NOTE — Telephone Encounter (Signed)
Called pt b/c pt informed me on last phone call that she had sent transmission. After waiting an hour I never received transmission on [employer]link website. I called pt and made sure all buttons, and wires were in the designated location. Pt verbalized that wires and buttons were in correct spot. I then asked pt if battery was good, pt stated that the battery light was off indicating battery is ok to use. I then asked pt what her phone service was, she stated that she had [employer]. I explained to pt that this was the issue that [employer] doesn't always work with these monitors and informed pt that I would mail her a filter. Pt stated that [place] was leaving for beach late this afternoon and that it would be next week before she transmitted. I informed pt that this would be ok.

## 2013-07-20 ENCOUNTER — Encounter: Payer: Self-pay | Admitting: Internal Medicine

## 2013-07-20 NOTE — Assessment & Plan Note (Signed)
Continues allergy vaccine 1:50 G0.

## 2013-07-20 NOTE — Assessment & Plan Note (Signed)
controlled 

## 2013-07-20 NOTE — Assessment & Plan Note (Signed)
Good compliance and control 

## 2013-07-23 ENCOUNTER — Encounter: Payer: Self-pay | Admitting: Internal Medicine

## 2013-07-23 ENCOUNTER — Encounter: Payer: Self-pay | Admitting: Interventional Cardiology

## 2013-07-23 ENCOUNTER — Ambulatory Visit (INDEPENDENT_AMBULATORY_CARE_PROVIDER_SITE_OTHER): Payer: Medicare Other | Admitting: Interventional Cardiology

## 2013-07-23 VITALS — BP 158/71 | HR 98 | Wt 141.0 lb

## 2013-07-23 DIAGNOSIS — I10 Essential (primary) hypertension: Secondary | ICD-10-CM | POA: Diagnosis not present

## 2013-07-23 DIAGNOSIS — R002 Palpitations: Secondary | ICD-10-CM | POA: Diagnosis not present

## 2013-07-23 DIAGNOSIS — I495 Sick sinus syndrome: Secondary | ICD-10-CM

## 2013-07-23 DIAGNOSIS — T887XXA Unspecified adverse effect of drug or medicament, initial encounter: Secondary | ICD-10-CM

## 2013-07-23 DIAGNOSIS — Z950 Presence of cardiac pacemaker: Secondary | ICD-10-CM | POA: Diagnosis not present

## 2013-07-23 LAB — BASIC METABOLIC PANEL
BUN: 25 mg/dL — ABNORMAL HIGH (ref 6–23)
CALCIUM: 10.1 mg/dL (ref 8.4–10.5)
CHLORIDE: 96 meq/L (ref 96–112)
CO2: 31 meq/L (ref 19–32)
Creatinine, Ser: 0.9 mg/dL (ref 0.4–1.2)
GFR: 67.57 mL/min (ref >60.00)
GLUCOSE: 180 mg/dL — AB (ref 70–99)
Potassium: 3.2 mEq/L — ABNORMAL LOW (ref 3.5–5.1)
SODIUM: 138 meq/L (ref 135–145)

## 2013-07-23 MED ORDER — NEBIVOLOL HCL 10 MG PO TABS: 10.0000 mg | ORAL_TABLET | Freq: Every day | ORAL | Status: DC

## 2013-07-23 NOTE — Progress Notes (Signed)
Patient ID: [name], female   DOB: 1945/09/18, 68 y.o.   MRN: [id number]    [address] [place], [place]  [address] Phone: [phone]([phone] Fax:  [phone]  Date:  07/23/2013   ID:  [name], [place] 1945/09/18, MRN [id number]  PCP:  Lupe CarneyMITCHELL,DEAN, MD   ASSESSMENT:  1. Palpitations since atenolol discontinued 2. Hair loss with a total wall/medication side effects 3. Sinus node dysfunction with chronotropic incompetence 4. DDD pacemaker 5. History of hypertension  PLAN:  1. Bystolic 10 mg daily 2. Clinical followup in 2-4 weeks for further medication titration   SUBJECTIVE: [name] is a 68 y.o. female who is here today complaining of palpitations and fast heart rate. She complained of fatigue and hair loss..Decline in needed discontinued atenolol. After total always when she began complaining of palpitations and racing heart. Diltiazem was discontinued transient leg but restarted. Fatigue is not improved off beta blocker therapy. She feels worse now than she did prior to medication adjustment. She has not had syncope. She can't tell me whether or not the hair loss as decrease in severity to   Wt Readings from Last 3 Encounters:  07/23/13 141 lb (63.957 kg)  06/24/13 144 lb (65.318 kg)  06/18/13 146 lb 9.6 oz (66.497 kg)     Past Medical History  Diagnosis Date  . Total knee replacement status   . Disorders of sacrum   . Depressive disorder, not elsewhere classified   . LBBB (left bundle branch block)   . Pain in joint, lower leg   . Unspecified asthma(493.90)   . Cardiac pacemaker medtronic   . Chronotropic incompetence with sinus node dysfunction   . Obstructive sleep apnea (adult) (pediatric)   . Anxiety state, unspecified   . Headache(784.0)   . Unspecified cerebral artery occlusion with cerebral infarction   . Type II or unspecified type diabetes mellitus without mention of complication, not stated as uncontrolled   . Other voice and  resonance disorders   . Bronchitis, not specified as acute or chronic   . Allergic rhinitis, cause unspecified   . Pulmonary embolism   . Myalgia and myositis, unspecified   . GERD (gastroesophageal reflux disease)   . Hypertension     Current Outpatient Prescriptions  Medication Sig Dispense Refill  . aspirin 81 MG tablet Take 81 mg by mouth daily.        Marland Kitchen. atorvastatin (LIPITOR) 20 MG tablet Take 20 mg by mouth daily.        . BD PEN NEEDLE NANO U/F 32G X 4 MM MISC Use as directed for Insulin Injections      . diltiazem (CARDIZEM) 120 MG tablet Take 120 mg by mouth daily.      . DULoxetine (CYMBALTA) 30 MG capsule Take 90 mg by mouth daily.       Marland Kitchen. EPINEPHrine (EPI-PEN) 0.3 mg/0.3 mL SOAJ Inject 0.3 mLs (0.3 mg total) into the muscle once.  1 Device  11  . FARXIGA 5 MG TABS Take 1 tablet by mouth daily.      Marland Kitchen. glipiZIDE (GLUCOTROL XL) 10 MG 24 hr tablet Take 10 mg by mouth daily.        . hydrochlorothiazide (HYDRODIURIL) 25 MG tablet Take 1 tablet (25 mg total) by mouth daily.  90 tablet  1  . hydrOXYzine (ATARAX/VISTARIL) 10 MG tablet Take 1 tablet (10 mg total) by mouth every 8 (eight) hours as needed for itching.  30 tablet  0  . insulin detemir (LEVEMIR) 100 UNIT/ML injection 30 units in the morning and 60 units in the evening      . loratadine (CLARITIN) 10 MG tablet Take 10 mg by mouth daily.      . Omega-3 Fatty Acids (FISH OIL) 1200 MG CAPS Take 2 capsules by mouth daily.        Marland Kitchen. omeprazole-sodium bicarbonate (ZEGERID) 40-1100 MG per capsule Take 1 capsule by mouth at bedtime.        . polyethylene glycol powder (MIRALAX) powder Take 17 g by mouth as needed.        . TRAZODONE HCL PO Take 25 mg by mouth at bedtime.      . TUBERCULIN SYR 1CC/25GX5/8" (B-D TB SYRINGE 1CC/25GX5/8") 25G X 5/8" 1 ML MISC Use as directed with allergy injections  100 each  0  . nebivolol (BYSTOLIC) 10 MG tablet Take 1 tablet (10 mg total) by mouth daily.      . [DISCONTINUED] amLODipine (NORVASC) 5  MG tablet Take 1 tablet by mouth daily.       No current facility-administered medications for this visit.    Allergies:    Allergies  Allergen Reactions  . Cephalexin   . Doxycycline   . Entex     REACTION: unknown  . Gabapentin     REACTION: unknown  . Levofloxacin     REACTION: unknown  . Lisinopril   . Losartan Potassium   . Metformin     REACTION: unknown  . Pregabalin     REACTION: unknown  . Rosiglitazone Maleate     REACTION: unknown  . Simvastatin     REACTION: unknown  . Sitagliptin Phosphate     REACTION: unknown    Social History:  The patient  reports that she quit smoking about 11 years ago. Her smoking use included Cigarettes. She has a 6 pack-year smoking history. She has never used smokeless tobacco. She reports that she does not drink alcohol or use illicit drugs.   ROS:  Please see the history of present illness.   She denies orthopnea and PND   All other systems reviewed and negative.   OBJECTIVE: VS:  BP 158/71  Pulse 98  Wt 141 lb (63.957 kg) Well nourished, well developed, in no acute distress, appears than stated age HEENT: normal Neck: JVD flat. Carotid bruit absent  Cardiac:  normal S1, S2; RRR; no murmur Lungs:  clear to auscultation bilaterally, no wheezing, rhonchi or rales Abd: soft, nontender, no hepatomegaly Ext: Edema absent. Pulses 2+ and symmetric Skin: warm and dry Neuro:  CNs 2-12 intact, no focal abnormalities noted  EKG:  Sinus rhythm and sinus tachycardia noted when last pacer interrogation was done within the past several days       Signed, Darci NeedleHenry W. B. Smith III, MD 07/23/2013 6:21 PM

## 2013-07-23 NOTE — Patient Instructions (Signed)
Your physician has recommended you make the following change in your medication:  1)START Bystolic 10mg  daily Take all other medications as prescribed  Lab Today Bmet  You have a follow up appointment scheduled for 08/26/13 @12pm 

## 2013-07-24 ENCOUNTER — Telehealth: Payer: Self-pay

## 2013-07-24 DIAGNOSIS — I10 Essential (primary) hypertension: Secondary | ICD-10-CM

## 2013-07-24 MED ORDER — HYDROCHLOROTHIAZIDE 25 MG PO TABS: 12.5000 mg | ORAL_TABLET | Freq: Every day | ORAL | Status: DC

## 2013-07-24 MED ORDER — POTASSIUM CHLORIDE CRYS ER 20 MEQ PO TBCR: 20.0000 meq | EXTENDED_RELEASE_TABLET | Freq: Every day | ORAL | Status: DC

## 2013-07-24 NOTE — Telephone Encounter (Signed)
called pt to give lab results and Dr.Smith instructions.Start K dur 20 meq daily. Decrease the Hctz to 1/2 tab daily.pt verbalized understanding.

## 2013-07-24 NOTE — Telephone Encounter (Signed)
Pt will have repeat bmet when she comes in for her f/u appt with Dr.Smith on 6/22 pt aware

## 2013-07-24 NOTE — Telephone Encounter (Signed)
Basic metabolic in 1 month

## 2013-07-24 NOTE — Telephone Encounter (Signed)
Message copied by Jarvis NewcomerPARRIS-GODLEY, LISA S on Wed Jul 24, 2013  1:16 PM ------      Message from: Verdis PrimeSMITH, HENRY      Created: Tue Jul 23, 2013  7:37 PM       Start K dur 20 meq daily. Decrease the Hctz to 1/2 tab daily ------

## 2013-07-26 ENCOUNTER — Encounter: Payer: Self-pay | Admitting: Cardiology

## 2013-08-02 ENCOUNTER — Other Ambulatory Visit: Payer: Self-pay | Admitting: *Deleted

## 2013-08-02 DIAGNOSIS — I10 Essential (primary) hypertension: Secondary | ICD-10-CM

## 2013-08-02 MED ORDER — NEBIVOLOL HCL 10 MG PO TABS: 10.0000 mg | ORAL_TABLET | Freq: Every day | ORAL | Status: DC

## 2013-08-03 DIAGNOSIS — T148XXA Other injury of unspecified body region, initial encounter: Secondary | ICD-10-CM | POA: Diagnosis not present

## 2013-08-05 DIAGNOSIS — T148XXA Other injury of unspecified body region, initial encounter: Secondary | ICD-10-CM | POA: Diagnosis not present

## 2013-08-05 DIAGNOSIS — B379 Candidiasis, unspecified: Secondary | ICD-10-CM | POA: Diagnosis not present

## 2013-08-08 ENCOUNTER — Ambulatory Visit (INDEPENDENT_AMBULATORY_CARE_PROVIDER_SITE_OTHER): Payer: Medicare Other | Admitting: *Deleted

## 2013-08-08 DIAGNOSIS — I442 Atrioventricular block, complete: Secondary | ICD-10-CM

## 2013-08-08 LAB — MDC_IDC_ENUM_SESS_TYPE_REMOTE
Battery Impedance: 650 Ohm
Battery Remaining Longevity: 58 mo
Battery Voltage: 2.75 V
Brady Statistic AP VP Percent: 0 %
Brady Statistic AP VS Percent: 13 %
Brady Statistic AS VP Percent: 2 %
Brady Statistic AS VS Percent: 85 %
Date Time Interrogation Session: 20150604175227
Lead Channel Pacing Threshold Amplitude: 0.625 V
Lead Channel Pacing Threshold Pulse Width: 0.4 ms
Lead Channel Sensing Intrinsic Amplitude: 2.8 mV
Lead Channel Setting Pacing Amplitude: 2.5 V
Lead Channel Setting Pacing Pulse Width: 0.4 ms
MDC IDC MSMT LEADCHNL RA IMPEDANCE VALUE: 548 Ohm
MDC IDC MSMT LEADCHNL RV IMPEDANCE VALUE: 711 Ohm
MDC IDC MSMT LEADCHNL RV PACING THRESHOLD AMPLITUDE: 0.625 V
MDC IDC MSMT LEADCHNL RV PACING THRESHOLD PULSEWIDTH: 0.4 ms
MDC IDC MSMT LEADCHNL RV SENSING INTR AMPL: 16 mV
MDC IDC SET LEADCHNL RA PACING AMPLITUDE: 2 V
MDC IDC SET LEADCHNL RV SENSING SENSITIVITY: 5.6 mV

## 2013-08-09 NOTE — Progress Notes (Signed)
Remote pacemaker transmission.   

## 2013-08-14 ENCOUNTER — Other Ambulatory Visit: Payer: Self-pay | Admitting: Internal Medicine

## 2013-08-19 DIAGNOSIS — H3530 Unspecified macular degeneration: Secondary | ICD-10-CM | POA: Diagnosis not present

## 2013-08-19 DIAGNOSIS — E119 Type 2 diabetes mellitus without complications: Secondary | ICD-10-CM | POA: Diagnosis not present

## 2013-08-26 ENCOUNTER — Ambulatory Visit (INDEPENDENT_AMBULATORY_CARE_PROVIDER_SITE_OTHER): Payer: Medicare Other | Admitting: Interventional Cardiology

## 2013-08-26 ENCOUNTER — Encounter: Payer: Self-pay | Admitting: Interventional Cardiology

## 2013-08-26 VITALS — BP 140/65 | HR 74 | Ht 63.0 in | Wt 140.0 lb

## 2013-08-26 DIAGNOSIS — I495 Sick sinus syndrome: Secondary | ICD-10-CM

## 2013-08-26 DIAGNOSIS — I10 Essential (primary) hypertension: Secondary | ICD-10-CM

## 2013-08-26 DIAGNOSIS — R0989 Other specified symptoms and signs involving the circulatory and respiratory systems: Secondary | ICD-10-CM

## 2013-08-26 DIAGNOSIS — Z950 Presence of cardiac pacemaker: Secondary | ICD-10-CM | POA: Diagnosis not present

## 2013-08-26 LAB — BASIC METABOLIC PANEL
BUN: 26 mg/dL — ABNORMAL HIGH (ref 6–23)
CHLORIDE: 97 meq/L (ref 96–112)
CO2: 31 mEq/L (ref 19–32)
Calcium: 9.6 mg/dL (ref 8.4–10.5)
Creatinine, Ser: 0.8 mg/dL (ref 0.4–1.2)
GFR: 71.27 mL/min (ref >60.00)
Glucose, Bld: 295 mg/dL — ABNORMAL HIGH (ref 70–99)
POTASSIUM: 4.3 meq/L (ref 3.5–5.1)
Sodium: 135 mEq/L (ref 135–145)

## 2013-08-26 NOTE — Patient Instructions (Signed)
Your physician recommends that you continue on your current medications as directed. Please refer to the Current Medication list given to you today.  Lab Today: Bmet  Your physician has requested that you have a carotid duplex. This test is an ultrasound of the carotid arteries in your neck. It looks at blood flow through these arteries that supply the brain with blood. Allow one hour for this exam. There are no restrictions or special instructions.  Your physician wants you to follow-up in: 1 year You will receive a reminder letter in the mail two months in advance. If you don't receive a letter, please call our office to schedule the follow-up appointment.

## 2013-08-26 NOTE — Progress Notes (Signed)
Patient ID: [name], female   DOB: Feb 09, 1946, 68 y.o.   MRN: [id number]    [address] [place], [place]  [address] Phone: [phone]([phone] Fax:  [phone]) [phone]  Date:  08/26/2013   ID:  [name], [place] Feb 09, 1946, MRN [id number]  PCP:  Lupe CarneyMITCHELL,DEAN, MD   ASSESSMENT:  1. Left carotid bruit. 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 Hypertension under excellent control 3. Permanent pacemaker for chronotropic incompetence, functioning normally 4. Medication side effect issue, atenolol, resolved with switch to nebivilol  PLAN:  1. Repeat bilateral carotid Doppler and compared to 2013 study 2. No change in current medical regimen 3. If no problems, clinical followup in one year   SUBJECTIVE: [name] is a 68 y.o. female who is made the switch to Bysystolic without side effects. She denies lightheadedness and dizziness.   Wt Readings from Last 3 Encounters:  08/26/13 140 lb (63.504 kg)  07/23/13 141 lb (63.957 kg)  06/24/13 144 lb (65.318 kg)     Past Medical History  Diagnosis Date  . Total knee replacement status   . Disorders of sacrum   . Depressive disorder, not elsewhere classified   . LBBB (left bundle branch block)   . Pain in joint, lower leg   . Unspecified asthma(493.90)   . Cardiac pacemaker medtronic   . Chronotropic incompetence with sinus node dysfunction   . Obstructive sleep apnea (adult) (pediatric)   . Anxiety state, unspecified   . Headache(784.0)   . Unspecified cerebral artery occlusion with cerebral infarction   . Type II or unspecified type diabetes mellitus without mention of complication, not stated as uncontrolled   . Other voice and resonance disorders   . Bronchitis, not specified as acute or chronic   . Allergic rhinitis, cause unspecified   . Pulmonary embolism   . Myalgia and myositis, unspecified   . GERD (gastroesophageal reflux disease)   . Hypertension     Current Outpatient  Prescriptions  Medication Sig Dispense Refill  . aspirin 81 MG tablet Take 81 mg by mouth daily.        Marland Kitchen. atorvastatin (LIPITOR) 20 MG tablet Take 20 mg by mouth daily.        . BD PEN NEEDLE NANO U/F 32G X 4 MM MISC Use as directed for Insulin Injections      . diltiazem (CARDIZEM) 120 MG tablet Take 120 mg by mouth daily.      Marland Kitchen. EPINEPHrine (EPI-PEN) 0.3 mg/0.3 mL SOAJ Inject 0.3 mLs (0.3 mg total) into the muscle once.  1 Device  11  . FARXIGA 5 MG TABS Take 1 tablet by mouth daily.      Marland Kitchen. glipiZIDE (GLUCOTROL XL) 10 MG 24 hr tablet Take 10 mg by mouth daily.        . hydrochlorothiazide (HYDRODIURIL) 25 MG tablet Take 0.5 tablets (12.5 mg total) by mouth daily.      . hydrOXYzine (ATARAX/VISTARIL) 10 MG tablet Take 1 tablet (10 mg total) by mouth every 8 (eight) hours as needed for itching.  30 tablet  0  . insulin detemir (LEVEMIR) 100 UNIT/ML injection 30 units in the morning and 60 units in the evening      . loratadine (CLARITIN) 10 MG tablet Take 10 mg by mouth daily.      . nebivolol (BYSTOLIC) 10 MG tablet Take 1 tablet (10 mg total) by mouth daily.  90 tablet  0  . Omega-3  Fatty Acids (FISH OIL) 1200 MG CAPS Take 2 capsules by mouth daily.        Marland Kitchen. omeprazole-sodium bicarbonate (ZEGERID) 40-1100 MG per capsule Take 1 capsule by mouth at bedtime.        . polyethylene glycol powder (MIRALAX) powder Take 17 g by mouth as needed.        . potassium chloride SA (K-DUR,KLOR-CON) 20 MEQ tablet Take 1 tablet (20 mEq total) by mouth daily.  30 tablet  11  . TRAZODONE HCL PO Take 25 mg by mouth at bedtime.      . TUBERCULIN SYR 1CC/25GX5/8" (B-D TB SYRINGE 1CC/25GX5/8") 25G X 5/8" 1 ML MISC Use as directed with allergy injections  100 each  0  . DULoxetine (CYMBALTA) 30 MG capsule Take 90 mg by mouth daily.       . [DISCONTINUED] amLODipine (NORVASC) 5 MG tablet Take 1 tablet by mouth daily.       No current facility-administered medications for this visit.    Allergies:    Allergies    Allergen Reactions  . Cephalexin   . Doxycycline   . Entex     REACTION: unknown  . Gabapentin     REACTION: unknown  . Levofloxacin     REACTION: unknown  . Lisinopril   . Losartan Potassium   . Metformin     REACTION: unknown  . Pregabalin     REACTION: unknown  . Rosiglitazone Maleate     REACTION: unknown  . Simvastatin     REACTION: unknown  . Sitagliptin Phosphate     REACTION: unknown    Social History:  The patient  reports that she quit smoking about 11 years ago. Her smoking use included Cigarettes. She has a 6 pack-year smoking history. She has never used smokeless tobacco. She reports that she does not drink alcohol or use illicit drugs.   ROS:  Please see the history of present illness.   No lightheadedness, dizziness, or significant complaints on her current medical regimen   All other systems reviewed and negative.   OBJECTIVE: VS:  BP 140/65  Pulse 74  Ht 5\' 3"  (1.6 m)  Wt 140 lb (63.504 kg)  BMI 24.81 kg/m2 Well nourished, well developed, in no acute distress, appears compatible with her stated age HEENT: normal Neck: JVD flat. Carotid bruit high-pitched continuous left carotid.  Cardiac:  normal S1, S2; RRR; no murmur Lungs:  clear to auscultation bilaterally, no wheezing, rhonchi or rales Abd: soft, nontender, no hepatomegaly Ext: Edema absent. Pulses 2+ Skin: warm and dry Neuro:  CNs 2-12 intact, no focal abnormalities noted  EKG:  Not repeated       Signed, Darci NeedleHenry W. B. Smith III, MD 08/26/2013 1:06 PM

## 2013-08-27 ENCOUNTER — Ambulatory Visit (HOSPITAL_COMMUNITY): Payer: Medicare Other | Attending: Cardiology | Admitting: Cardiology

## 2013-08-27 DIAGNOSIS — Z8673 Personal history of transient ischemic attack (TIA), and cerebral infarction without residual deficits: Secondary | ICD-10-CM | POA: Diagnosis not present

## 2013-08-27 DIAGNOSIS — I6529 Occlusion and stenosis of unspecified carotid artery: Secondary | ICD-10-CM | POA: Insufficient documentation

## 2013-08-27 DIAGNOSIS — Z87891 Personal history of nicotine dependence: Secondary | ICD-10-CM | POA: Diagnosis not present

## 2013-08-27 DIAGNOSIS — R0989 Other specified symptoms and signs involving the circulatory and respiratory systems: Secondary | ICD-10-CM | POA: Diagnosis not present

## 2013-08-27 DIAGNOSIS — I10 Essential (primary) hypertension: Secondary | ICD-10-CM | POA: Insufficient documentation

## 2013-08-27 DIAGNOSIS — I658 Occlusion and stenosis of other precerebral arteries: Secondary | ICD-10-CM | POA: Insufficient documentation

## 2013-08-27 DIAGNOSIS — E119 Type 2 diabetes mellitus without complications: Secondary | ICD-10-CM | POA: Diagnosis not present

## 2013-08-27 NOTE — Progress Notes (Signed)
Carotid duplex completed 

## 2013-08-28 ENCOUNTER — Telehealth: Payer: Self-pay

## 2013-08-28 DIAGNOSIS — I6522 Occlusion and stenosis of left carotid artery: Secondary | ICD-10-CM

## 2013-08-28 NOTE — Telephone Encounter (Signed)
pt given carotid and lab reults.LICA is up to 79% obstructed. Repeat study in 6 months.Blood sugar is elevated at 295. Otherwise unremarkable.pt verbalized understanding.

## 2013-08-28 NOTE — Telephone Encounter (Signed)
Message copied by Jarvis NewcomerPARRIS-GODLEY, Parys Elenbaas S on Wed Aug 28, 2013  3:45 PM ------      Message from: Verdis PrimeSMITH, HENRY      Created: Wed Aug 28, 2013  1:31 PM       LICA is up to 79% obstructed. Repeat study in 6 months ------

## 2013-08-28 NOTE — Telephone Encounter (Signed)
Order placed for repeat carotid Dec 2015

## 2013-09-02 ENCOUNTER — Encounter (HOSPITAL_COMMUNITY): Payer: Self-pay | Admitting: Interventional Cardiology

## 2013-09-04 ENCOUNTER — Encounter: Payer: Self-pay | Admitting: Cardiology

## 2013-09-09 ENCOUNTER — Telehealth: Payer: Self-pay

## 2013-09-09 NOTE — Telephone Encounter (Signed)
pt aware of Dr.Smith instructions pt to continue K+, pt vebalized understanding.

## 2013-09-09 NOTE — Telephone Encounter (Signed)
Message copied by Jarvis NewcomerPARRIS-GODLEY, Siarra Gilkerson S on Mon Sep 09, 2013  9:57 AM ------      Message from: Verdis PrimeSMITH, HENRY      Created: Sat Sep 07, 2013  7:40 PM       Yes. Please continue potassium ------

## 2013-09-25 ENCOUNTER — Encounter: Payer: Self-pay | Admitting: Internal Medicine

## 2013-10-06 ENCOUNTER — Other Ambulatory Visit: Payer: Self-pay | Admitting: Interventional Cardiology

## 2013-10-09 DIAGNOSIS — M159 Polyosteoarthritis, unspecified: Secondary | ICD-10-CM | POA: Diagnosis not present

## 2013-10-09 DIAGNOSIS — E7800 Pure hypercholesterolemia, unspecified: Secondary | ICD-10-CM | POA: Diagnosis not present

## 2013-10-09 DIAGNOSIS — IMO0001 Reserved for inherently not codable concepts without codable children: Secondary | ICD-10-CM | POA: Diagnosis not present

## 2013-10-09 DIAGNOSIS — I10 Essential (primary) hypertension: Secondary | ICD-10-CM | POA: Diagnosis not present

## 2013-10-09 DIAGNOSIS — F329 Major depressive disorder, single episode, unspecified: Secondary | ICD-10-CM | POA: Diagnosis not present

## 2013-10-11 ENCOUNTER — Ambulatory Visit (INDEPENDENT_AMBULATORY_CARE_PROVIDER_SITE_OTHER): Payer: Medicare Other

## 2013-10-11 DIAGNOSIS — J309 Allergic rhinitis, unspecified: Secondary | ICD-10-CM | POA: Diagnosis not present

## 2013-11-12 ENCOUNTER — Ambulatory Visit (INDEPENDENT_AMBULATORY_CARE_PROVIDER_SITE_OTHER): Payer: Medicare Other | Admitting: *Deleted

## 2013-11-12 ENCOUNTER — Telehealth: Payer: Self-pay | Admitting: Cardiology

## 2013-11-12 DIAGNOSIS — I442 Atrioventricular block, complete: Secondary | ICD-10-CM

## 2013-11-12 NOTE — Telephone Encounter (Signed)
Spoke with pt and reminded pt of remote transmission that is due today. Pt verbalized understanding.   

## 2013-11-13 ENCOUNTER — Telehealth: Payer: Self-pay | Admitting: Internal Medicine

## 2013-11-13 ENCOUNTER — Encounter: Payer: Self-pay | Admitting: Internal Medicine

## 2013-11-13 DIAGNOSIS — I442 Atrioventricular block, complete: Secondary | ICD-10-CM

## 2013-11-13 MED ORDER — EPINEPHRINE 0.3 MG/0.3ML IJ SOAJ: 0.3000 mg | Freq: Once | INTRAMUSCULAR | Status: DC

## 2013-11-13 NOTE — Telephone Encounter (Signed)
Called and lmom to make the pt aware of rx that has been sent to her pharmacy.   She has appt in oct with CY and advised the pt in the message to keep this appt.

## 2013-11-13 NOTE — Progress Notes (Signed)
Remote pacemaker transmission.   

## 2013-11-16 LAB — MDC_IDC_ENUM_SESS_TYPE_REMOTE
Battery Impedance: 700 Ohm
Brady Statistic AS VS Percent: 37 %
Lead Channel Impedance Value: 584 Ohm
Lead Channel Impedance Value: 720 Ohm
Lead Channel Pacing Threshold Amplitude: 0.625 V
Lead Channel Pacing Threshold Pulse Width: 0.4 ms
Lead Channel Pacing Threshold Pulse Width: 0.4 ms
Lead Channel Setting Pacing Amplitude: 2 V
Lead Channel Setting Pacing Pulse Width: 0.4 ms
Lead Channel Setting Sensing Sensitivity: 5.6 mV
MDC IDC MSMT BATTERY REMAINING LONGEVITY: 54 mo
MDC IDC MSMT BATTERY VOLTAGE: 2.75 V
MDC IDC MSMT LEADCHNL RA PACING THRESHOLD AMPLITUDE: 0.625 V
MDC IDC MSMT LEADCHNL RA SENSING INTR AMPL: 2.8 mV
MDC IDC MSMT LEADCHNL RV SENSING INTR AMPL: 16 mV
MDC IDC SESS DTM: 20150909134342
MDC IDC SET LEADCHNL RV PACING AMPLITUDE: 2.5 V
MDC IDC STAT BRADY AP VP PERCENT: 25 %
MDC IDC STAT BRADY AP VS PERCENT: 9 %
MDC IDC STAT BRADY AS VP PERCENT: 30 %

## 2013-11-29 DIAGNOSIS — M47812 Spondylosis without myelopathy or radiculopathy, cervical region: Secondary | ICD-10-CM | POA: Diagnosis not present

## 2013-11-29 DIAGNOSIS — M19049 Primary osteoarthritis, unspecified hand: Secondary | ICD-10-CM | POA: Diagnosis not present

## 2013-11-29 DIAGNOSIS — M25519 Pain in unspecified shoulder: Secondary | ICD-10-CM | POA: Diagnosis not present

## 2013-12-05 DIAGNOSIS — I10 Essential (primary) hypertension: Secondary | ICD-10-CM | POA: Diagnosis not present

## 2013-12-05 DIAGNOSIS — E1165 Type 2 diabetes mellitus with hyperglycemia: Secondary | ICD-10-CM | POA: Diagnosis not present

## 2013-12-06 DIAGNOSIS — M7522 Bicipital tendinitis, left shoulder: Secondary | ICD-10-CM | POA: Diagnosis not present

## 2013-12-09 ENCOUNTER — Encounter: Payer: Self-pay | Admitting: Cardiology

## 2013-12-13 DIAGNOSIS — M25512 Pain in left shoulder: Secondary | ICD-10-CM | POA: Diagnosis not present

## 2013-12-13 DIAGNOSIS — M7522 Bicipital tendinitis, left shoulder: Secondary | ICD-10-CM | POA: Diagnosis not present

## 2013-12-18 ENCOUNTER — Ambulatory Visit: Payer: Medicare Other | Admitting: Internal Medicine

## 2013-12-18 ENCOUNTER — Encounter: Payer: Self-pay | Admitting: Internal Medicine

## 2013-12-18 VITALS — BP 150/62 | HR 73 | Ht 63.0 in | Wt 144.2 lb

## 2013-12-18 DIAGNOSIS — Z23 Encounter for immunization: Secondary | ICD-10-CM

## 2013-12-18 NOTE — Progress Notes (Signed)
12/21/10- 68 year old female former smoker followed for rhinoconjunctivitis, asthma, obstructive sleep apnea, history DVT Last here- 12/21/2009 She she has had replacement of her previous total knee joint replacement on the right and is now feeling better. Because of repeated knee surgeries, she has not driven in 3 years. Not using CPAP, blaming problems with her gums. She had needed some grafts and thinks pressure from her CPAP damage those so that might need to be done again. She has lost 15-18 pounds. Husband says she is not snoring. 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 Now has a cat but won't let in the bedroom. Allergy symptoms have not changed much but she notices poor sense of taste and smell. She uses over-the-counter generic allergy pills. Dentist had given Z-Pak for sinusitis. She continues allergy vaccine at 1:10 without problem.  05/18/11- 68 year old female former smoker followed for rhinoconjunctivitis, asthma, obstructive sleep apnea, history DVT Here to update allergy skin tests: Perennial nasal congestion. Off antihistamines, c/o nasal itching,increased postnasal drip,  some green, frontal and maxillary pressure- worse starting last night. Not a cold. . . Allergy Skin Test-  Positive intradermal tests for grass, weed, tree pollens, dust, some molds  06/28/11- 68 year old female former smoker followed for rhinoconjunctivitis, asthma, obstructive sleep apnea, history DVT Acute visit-states unable to breathe and feels like she cant get air; nasal congestion; no taste or smell since 10-2010 (been happening that long) Comes in tearful and anxious today. Husband is with her. She dates decreased sense of taste and smell from the time of her knee surgery in August of 2012. Can't breathe well through her nose. Face hurts, ears stopped up, sneezing. Complains mostly of the sense of pressure through her frontal and maxillary areas and ears. She is using up her current vaccine supply with intention of  restarting with new mix from skin testing in 3/13..  09/13/11- 68 year old female former smoker followed for rhinoconjunctivitis, asthma, obstructive sleep apnea, history DVT Rebuilding allergy vaccine with new mix.  Pt c/o nasal congestion, dry cough and trouble breathing.  Pt states she feels as though something is "stuck in her throat." She states that this "may be anxiety". She always feels blockage in her nose after history of nasal fracture when a garage door came down on her face. Nasal strips did not help much. She likes Dymista nasal spray. Wears CPAP all night every night, doing well. Sleeps with night guard for bruxism.  11/15/11-  68 year old female former smoker followed for rhinoconjunctivitis, asthma, obstructive sleep apnea, history DVT Rebuilding allergy vaccine with new mix.  Pt states that after last visit nasal congesiton had started to improve but for the past wk it has been worse. She has a clear nasal d/c. Cough is some better since last visit, but does bother her some and is dry. Notes fall pollen. Continues allergy vaccine 1:500, building GO.  Dr Randa EvensEdwards/ GI evaluating known active GERD with Zegerid/ Nexium. Did EGD-> relieved substernal pain she had been trying to blame on "allergy".Tells me she is "much better".  CPAP every night, all night, working well.   06/21/12- 68 year old female former smoker followed for rhinoconjunctivitis, asthma, obstructive sleep apnea, history DVT FOLLOW FOR: pt reports things are "much better" than last at last visit-- states allergies are much better than year, just having some diff w eyes itching--wearing CPAP everynight avg 6hrs per night tolerating well. She was better at the beach than around here. Admits some itching of eyes, nose and face- mild seasonal flare but better than last  year and manage with her meds. She continues allergy vaccine 1:50 G0, which helps. -Continue [facility] 11/[facility], doing well. CT max fac 06/29/11 IMPRESSION:  No  significant sinusitis.  Temporal mandibular joint degeneration bilaterally.  Original Report Authenticated By: Camelia PhenesAVID C. CLARK, M.D.  06/18/13- 68 year old female former smoker followed for rhinoconjunctivitis, asthma, obstructive sleep apnea, history DVT FOLLOWS UJW:[id number]:headaches, itchy and watery eyes, runny nose, pressure in sinus area. Still on allergy vaccine 1:50 GO, CPAP 11/ [facility] Aware of seasonal pollen with increased nasal congestion, but not bad.  12/18/13- 68 year old female former smoker followed for rhinoconjunctivitis, asthma, obstructive sleep apnea, history DVT Allergy vaccine 1:50 GO, CPAP 11/ [facility] FOLLOW FOR:  Allergies, OSA, Pt says she uses CPAP sometimes, but every time she uses the CPAP, it causes a rash along the back of her neck, so she doesn't like to use it.     ROS-see HPI Constitutional:   No-   weight loss, night sweats, fevers, chills, fatigue, lassitude. HEENT:   No-significant headaches, difficulty swallowing, tooth/dental problems, sore throat,       No-  Sneezing,  + itching, + nasal congestion, post nasal drip,  CV:  No-   chest pain, orthopnea, PND, swelling in lower extremities, anasarca, dizziness, palpitations Resp: No-   shortness of breath with exertion or at rest.              No-   productive cough,  No non-productive cough,  No- coughing up of blood.              No-   change in color of mucus.  No- wheezing.   Skin: Itching with sore excoriations GI:  No-   heartburn, indigestion, abdominal pain, nausea, vomiting, GU: . MS:  No-   joint pain or swelling. Neuro-     nothing unusual Psych:  No- change in mood or affect. No depression or anxiety.  No memory loss.  OBJ- Physical Exam General- Alert, Oriented, Affect-calm and pleasant , Distress- none acute Skin- Dry skin with a few excoriations back of neck. Lymphadenopathy- none Head- atraumatic            Eyes- Gross vision intact, PERRLA, conjunctivae and secretions clear            Ears-  Hearing, canals-normal            Nose- +stuffy, no-Septal dev, mucus, polyps, erosion, perforation.  +a little nasal speech, sniffing( old nasal trauma)            Throat- Mallampati III-IV , mucosa clear , drainage- none, tonsils- atrophic Neck- flexible , trachea midline, no stridor , thyroid nl, carotid no bruit Chest - symmetrical excursion , unlabored           Heart/CV- RRR , no murmur , no gallop  , no rub, nl s1 s2                            JVD- none , edema- none, stasis changes- none, varices- none           Lung- clear to P&A, wheeze- none, cough- none , dullness-none, rub- none           Chest wall- +L pacemaker Abd-  Br/ Gen/ Rectal- Not done, not indicated Extrem- cyanosis- none, clubbing, none, atrophy- none, strength- nl Neuro- grossly intact to observation

## 2013-12-18 NOTE — Patient Instructions (Signed)
Flu vax  Ask at [facility] from time to time to see if they have any new CPAP masks you can look at that might bother your neck less.

## 2013-12-23 DIAGNOSIS — H26491 Other secondary cataract, right eye: Secondary | ICD-10-CM | POA: Diagnosis not present

## 2014-01-17 ENCOUNTER — Encounter: Payer: Self-pay | Admitting: Internal Medicine

## 2014-01-23 ENCOUNTER — Other Ambulatory Visit: Payer: Self-pay

## 2014-01-23 DIAGNOSIS — Z1231 Encounter for screening mammogram for malignant neoplasm of breast: Secondary | ICD-10-CM

## 2014-02-07 ENCOUNTER — Ambulatory Visit (INDEPENDENT_AMBULATORY_CARE_PROVIDER_SITE_OTHER): Payer: Medicare Other

## 2014-02-07 DIAGNOSIS — J309 Allergic rhinitis, unspecified: Secondary | ICD-10-CM

## 2014-02-17 ENCOUNTER — Ambulatory Visit (INDEPENDENT_AMBULATORY_CARE_PROVIDER_SITE_OTHER): Payer: Medicare Other | Admitting: *Deleted

## 2014-02-17 ENCOUNTER — Telehealth: Payer: Self-pay | Admitting: Cardiology

## 2014-02-17 DIAGNOSIS — I442 Atrioventricular block, complete: Secondary | ICD-10-CM | POA: Diagnosis not present

## 2014-02-17 LAB — MDC_IDC_ENUM_SESS_TYPE_REMOTE
Battery Impedance: 778 Ohm
Battery Remaining Longevity: 51 mo
Battery Voltage: 2.75 V
Brady Statistic AP VP Percent: 31 %
Lead Channel Pacing Threshold Amplitude: 0.625 V
Lead Channel Pacing Threshold Pulse Width: 0.4 ms
Lead Channel Setting Pacing Amplitude: 2.5 V
Lead Channel Setting Pacing Pulse Width: 0.4 ms
Lead Channel Setting Sensing Sensitivity: 5.6 mV
MDC IDC MSMT LEADCHNL RA IMPEDANCE VALUE: 557 Ohm
MDC IDC MSMT LEADCHNL RA PACING THRESHOLD AMPLITUDE: 0.625 V
MDC IDC MSMT LEADCHNL RA PACING THRESHOLD PULSEWIDTH: 0.4 ms
MDC IDC MSMT LEADCHNL RA SENSING INTR AMPL: 2.8 mV
MDC IDC MSMT LEADCHNL RV IMPEDANCE VALUE: 747 Ohm
MDC IDC MSMT LEADCHNL RV SENSING INTR AMPL: 16 mV
MDC IDC SESS DTM: 20151214184712
MDC IDC SET LEADCHNL RA PACING AMPLITUDE: 2 V
MDC IDC STAT BRADY AP VS PERCENT: 5 %
MDC IDC STAT BRADY AS VP PERCENT: 41 %
MDC IDC STAT BRADY AS VS PERCENT: 22 %

## 2014-02-17 NOTE — Telephone Encounter (Signed)
Spoke with pt and reminded pt of remote transmission that is due today. Pt verbalized understanding.   

## 2014-02-17 NOTE — Progress Notes (Signed)
Remote pacemaker transmission.   

## 2014-02-19 DIAGNOSIS — Z961 Presence of intraocular lens: Secondary | ICD-10-CM | POA: Diagnosis not present

## 2014-02-19 DIAGNOSIS — H3530 Unspecified macular degeneration: Secondary | ICD-10-CM | POA: Diagnosis not present

## 2014-02-19 DIAGNOSIS — H1013 Acute atopic conjunctivitis, bilateral: Secondary | ICD-10-CM | POA: Diagnosis not present

## 2014-02-19 DIAGNOSIS — H04123 Dry eye syndrome of bilateral lacrimal glands: Secondary | ICD-10-CM | POA: Diagnosis not present

## 2014-02-26 ENCOUNTER — Ambulatory Visit (HOSPITAL_COMMUNITY): Payer: Medicare Other | Attending: Cardiovascular Disease | Admitting: Cardiology

## 2014-02-26 ENCOUNTER — Ambulatory Visit
Admission: RE | Admit: 2014-02-26 | Discharge: 2014-02-26 | Disposition: A | Discharge status: Home / Self Care | Payer: Medicare Other | Source: Ambulatory Visit

## 2014-02-26 DIAGNOSIS — I6523 Occlusion and stenosis of bilateral carotid arteries: Secondary | ICD-10-CM

## 2014-02-26 DIAGNOSIS — Z87891 Personal history of nicotine dependence: Secondary | ICD-10-CM | POA: Diagnosis not present

## 2014-02-26 DIAGNOSIS — R0989 Other specified symptoms and signs involving the circulatory and respiratory systems: Secondary | ICD-10-CM

## 2014-02-26 DIAGNOSIS — Z8673 Personal history of transient ischemic attack (TIA), and cerebral infarction without residual deficits: Secondary | ICD-10-CM | POA: Diagnosis not present

## 2014-02-26 DIAGNOSIS — E119 Type 2 diabetes mellitus without complications: Secondary | ICD-10-CM | POA: Insufficient documentation

## 2014-02-26 DIAGNOSIS — I10 Essential (primary) hypertension: Secondary | ICD-10-CM | POA: Insufficient documentation

## 2014-02-26 DIAGNOSIS — Z1231 Encounter for screening mammogram for malignant neoplasm of breast: Secondary | ICD-10-CM | POA: Diagnosis not present

## 2014-02-26 DIAGNOSIS — I6522 Occlusion and stenosis of left carotid artery: Secondary | ICD-10-CM

## 2014-02-26 DIAGNOSIS — R42 Dizziness and giddiness: Secondary | ICD-10-CM | POA: Diagnosis not present

## 2014-02-26 NOTE — Progress Notes (Signed)
Carotid duplex performed 

## 2014-03-04 ENCOUNTER — Other Ambulatory Visit: Payer: Self-pay | Admitting: Family Medicine

## 2014-03-04 ENCOUNTER — Other Ambulatory Visit: Payer: Self-pay | Admitting: Interventional Cardiology

## 2014-03-04 DIAGNOSIS — R928 Other abnormal and inconclusive findings on diagnostic imaging of breast: Secondary | ICD-10-CM

## 2014-03-11 DIAGNOSIS — E1165 Type 2 diabetes mellitus with hyperglycemia: Secondary | ICD-10-CM | POA: Diagnosis not present

## 2014-03-12 ENCOUNTER — Telehealth: Payer: Self-pay

## 2014-03-12 DIAGNOSIS — I6523 Occlusion and stenosis of bilateral carotid arteries: Secondary | ICD-10-CM

## 2014-03-12 NOTE — Telephone Encounter (Signed)
-----   Message from Lyn RecordsHenry W Smith III, MD sent at 03/07/2014  6:01 PM EST ----- Both carotids now in 60-79% range and needs f/u in 6 months

## 2014-03-12 NOTE — Telephone Encounter (Signed)
Pt aware of carotid results. Both carotids now in 60-79% range and needs f/u in 6 months.pt verbalized understanding.

## 2014-03-13 ENCOUNTER — Encounter: Payer: Self-pay | Admitting: Cardiology

## 2014-03-17 ENCOUNTER — Ambulatory Visit
Admission: RE | Admit: 2014-03-17 | Discharge: 2014-03-17 | Disposition: A | Discharge status: Home / Self Care | Payer: Medicare Other | Source: Ambulatory Visit | Attending: Family Medicine | Admitting: Family Medicine

## 2014-03-17 DIAGNOSIS — N6002 Solitary cyst of left breast: Secondary | ICD-10-CM | POA: Diagnosis not present

## 2014-03-17 DIAGNOSIS — R928 Other abnormal and inconclusive findings on diagnostic imaging of breast: Secondary | ICD-10-CM

## 2014-03-19 ENCOUNTER — Encounter: Payer: Self-pay | Admitting: Internal Medicine

## 2014-03-19 DIAGNOSIS — M7522 Bicipital tendinitis, left shoulder: Secondary | ICD-10-CM | POA: Diagnosis not present

## 2014-03-19 DIAGNOSIS — M25512 Pain in left shoulder: Secondary | ICD-10-CM | POA: Diagnosis not present

## 2014-03-20 ENCOUNTER — Other Ambulatory Visit: Payer: Self-pay | Admitting: Orthopaedic Surgery

## 2014-03-20 DIAGNOSIS — M25512 Pain in left shoulder: Secondary | ICD-10-CM

## 2014-03-21 ENCOUNTER — Ambulatory Visit
Admission: RE | Admit: 2014-03-21 | Discharge: 2014-03-21 | Disposition: A | Discharge status: Home / Self Care | Payer: Medicare Other | Source: Ambulatory Visit | Attending: Orthopaedic Surgery | Admitting: Orthopaedic Surgery

## 2014-03-21 DIAGNOSIS — M25512 Pain in left shoulder: Secondary | ICD-10-CM

## 2014-03-21 DIAGNOSIS — M19012 Primary osteoarthritis, left shoulder: Secondary | ICD-10-CM | POA: Diagnosis not present

## 2014-03-21 MED ORDER — IOHEXOL 180 MG/ML  SOLN
15.0000 mL | Freq: Once | INTRAMUSCULAR | Status: AC | PRN
Start: 2014-03-21 — End: 2014-03-21
  Administered 2014-03-21: 15 mL via INTRA_ARTICULAR

## 2014-03-25 DIAGNOSIS — M7522 Bicipital tendinitis, left shoulder: Secondary | ICD-10-CM | POA: Diagnosis not present

## 2014-03-25 DIAGNOSIS — M25512 Pain in left shoulder: Secondary | ICD-10-CM | POA: Diagnosis not present

## 2014-03-26 ENCOUNTER — Other Ambulatory Visit: Payer: Self-pay | Admitting: Internal Medicine

## 2014-04-02 ENCOUNTER — Ambulatory Visit: Payer: Medicare Other | Attending: Orthopaedic Surgery

## 2014-04-02 DIAGNOSIS — M25512 Pain in left shoulder: Secondary | ICD-10-CM | POA: Diagnosis not present

## 2014-04-10 ENCOUNTER — Ambulatory Visit: Payer: Medicare Other | Attending: Orthopaedic Surgery | Admitting: Physical Therapy

## 2014-04-10 DIAGNOSIS — M25512 Pain in left shoulder: Secondary | ICD-10-CM | POA: Insufficient documentation

## 2014-04-14 ENCOUNTER — Ambulatory Visit: Payer: Medicare Other | Admitting: Physical Therapy

## 2014-04-15 ENCOUNTER — Ambulatory Visit (INDEPENDENT_AMBULATORY_CARE_PROVIDER_SITE_OTHER): Payer: Medicare Other | Admitting: Internal Medicine

## 2014-04-15 ENCOUNTER — Encounter: Payer: Self-pay | Admitting: Internal Medicine

## 2014-04-15 VITALS — BP 147/65 | HR 93 | Ht 63.0 in | Wt 149.2 lb

## 2014-04-15 DIAGNOSIS — I495 Sick sinus syndrome: Secondary | ICD-10-CM

## 2014-04-15 DIAGNOSIS — I6523 Occlusion and stenosis of bilateral carotid arteries: Secondary | ICD-10-CM

## 2014-04-15 DIAGNOSIS — F329 Major depressive disorder, single episode, unspecified: Secondary | ICD-10-CM | POA: Diagnosis not present

## 2014-04-15 DIAGNOSIS — M150 Primary generalized (osteo)arthritis: Secondary | ICD-10-CM | POA: Diagnosis not present

## 2014-04-15 DIAGNOSIS — Z45018 Encounter for adjustment and management of other part of cardiac pacemaker: Secondary | ICD-10-CM | POA: Diagnosis not present

## 2014-04-15 DIAGNOSIS — J309 Allergic rhinitis, unspecified: Secondary | ICD-10-CM | POA: Diagnosis not present

## 2014-04-15 DIAGNOSIS — Z23 Encounter for immunization: Secondary | ICD-10-CM | POA: Diagnosis not present

## 2014-04-15 DIAGNOSIS — K219 Gastro-esophageal reflux disease without esophagitis: Secondary | ICD-10-CM | POA: Diagnosis not present

## 2014-04-15 DIAGNOSIS — E780 Pure hypercholesterolemia: Secondary | ICD-10-CM | POA: Diagnosis not present

## 2014-04-15 DIAGNOSIS — I10 Essential (primary) hypertension: Secondary | ICD-10-CM | POA: Diagnosis not present

## 2014-04-15 LAB — MDC_IDC_ENUM_SESS_TYPE_INCLINIC
Battery Impedance: 802 Ohm
Battery Remaining Longevity: 49 mo
Brady Statistic AP VP Percent: 32 %
Brady Statistic AS VP Percent: 45 %
Date Time Interrogation Session: 20160209152714
Lead Channel Impedance Value: 473 Ohm
Lead Channel Impedance Value: 718 Ohm
Lead Channel Pacing Threshold Amplitude: 0.5 V
Lead Channel Pacing Threshold Pulse Width: 0.4 ms
Lead Channel Sensing Intrinsic Amplitude: 4 mV
Lead Channel Setting Pacing Amplitude: 2 V
Lead Channel Setting Pacing Pulse Width: 0.4 ms
MDC IDC MSMT BATTERY VOLTAGE: 2.75 V
MDC IDC MSMT LEADCHNL RV PACING THRESHOLD AMPLITUDE: 0.75 V
MDC IDC MSMT LEADCHNL RV PACING THRESHOLD PULSEWIDTH: 0.4 ms
MDC IDC SET LEADCHNL RV PACING AMPLITUDE: 2.5 V
MDC IDC SET LEADCHNL RV SENSING SENSITIVITY: 5.6 mV
MDC IDC STAT BRADY AP VS PERCENT: 4 %
MDC IDC STAT BRADY AS VS PERCENT: 18 %

## 2014-04-15 NOTE — Progress Notes (Signed)
Patient Care Team: Lupe Carneyean Mitchell, MD as PCP - General (Family Medicine)   HPI  [name] GlazeLinda J Kowalewski is a 69 y.o. female Seen in followup. The device was implanted for sinus node dysfunction November 2008 it was associated with a modest improvement in activity.  She is a history of coronary artery disease with moderate-mild diffuse disease of her LAD; this was identified a catheterization 2005. Myoview scan 2008 demonstrated no ischemia.  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 She has undergone physical therapy for limited motion.       Past Medical History  Diagnosis Date  . Total knee replacement status   . Disorders of sacrum   . Depressive disorder, not elsewhere classified   . LBBB (left bundle branch block)   . Pain in joint, lower leg   . Unspecified asthma(493.90)   . Cardiac pacemaker medtronic   . Chronotropic incompetence with sinus node dysfunction   . Obstructive sleep apnea (adult) (pediatric)   . Anxiety state, unspecified   . Headache(784.0)   . Unspecified cerebral artery occlusion with cerebral infarction   . Type II or unspecified type diabetes mellitus without mention of complication, not stated as uncontrolled   . Other voice and resonance disorders   . Bronchitis, not specified as acute or chronic   . Allergic rhinitis, cause unspecified   . Pulmonary embolism   . Myalgia and myositis, unspecified   . GERD (gastroesophageal reflux disease)   . Hypertension     Past Surgical History  Procedure Laterality Date  . Total knee arthroplasty  2002    right  . Total knee repair  2010    right  . Vesicovaginal fistula closure w/ tah  1974  . Bilateral salpingoophorectomy  1991  . Cholecystectomy    . Nasal fracture surgery    . Temporomandibular joint arthroplasty  1982  . Esophagogastroduodenoscopy  11/02/2011    Procedure:  ESOPHAGOGASTRODUODENOSCOPY (EGD);  Surgeon: Vertell NovakJames L Edwards Jr., MD;  [facility];  Service: Endoscopy;  Laterality: N/A;  with xray  . Savory dilation  11/02/2011    Procedure: SAVORY DILATION;  Surgeon: Vertell NovakJames L Edwards Jr., MD;  [facility];  Service: Endoscopy;  Laterality: N/A;  . Esophagogastroduodenoscopy N/A 06/26/2013    Procedure: ESOPHAGOGASTRODUODENOSCOPY (EGD);  Surgeon: Vertell NovakJames L Edwards Jr., MD;  [facility];  Service: Endoscopy;  Laterality: N/A;  . Balloon dilation N/A 06/26/2013    Procedure: BALLOON DILATION;  Surgeon: Vertell NovakJames L Edwards Jr., MD;  [facility];  Service: Endoscopy;  Laterality: N/A;  . Colonoscopy N/A 06/26/2013    Procedure: COLONOSCOPY;  Surgeon: Vertell NovakJames L Edwards Jr., MD;  [facility]: [facility];  Service: Endoscopy;  Laterality: N/A;    Current Outpatient Prescriptions  Medication Sig Dispense Refill  . aspirin 81 MG tablet Take 81 mg by mouth daily.      Marland Kitchen. atorvastatin (LIPITOR) 20 MG tablet Take 20 mg by mouth daily.      . BD PEN NEEDLE NANO U/F 32G X 4 MM MISC Use as directed for Insulin Injections    . BYSTOLIC 10 MG tablet TAKE 1 TABLET DAILY 90 tablet 0  . diltiazem (CARDIZEM) 120 MG tablet Take 120 mg by mouth daily.    . DULoxetine (CYMBALTA) 30 MG capsule Take 90 mg by mouth daily.     Marland Kitchen. EPINEPHrine 0.3  mg/0.3 mL IJ SOAJ injection Inject 0.3 mLs (0.3 mg total) into the muscle once. 1 Device 11  . hydrochlorothiazide (HYDRODIURIL) 25 MG tablet TAKE 1 TABLET (25 MG TOTAL) BY MOUTH DAILY. 90 tablet 1  . hydrOXYzine (ATARAX/VISTARIL) 10 MG tablet Take 1 tablet (10 mg total) by mouth every 8 (eight) hours as needed for itching. 30 tablet 0  . insulin detemir (LEVEMIR) 100 UNIT/ML injection 30 units in the morning and 60 units in the evening; currently in adjusting phase with Endocrinology, not sure of dosing at this time, changes every 2 weeks.Marland KitchenMarland KitchenSliding scale    . loratadine (CLARITIN) 10 MG tablet Take 10 mg by mouth  daily.    . Omega-3 Fatty Acids (FISH OIL) 1200 MG CAPS Take 2 capsules by mouth daily.      Marland Kitchen omeprazole-sodium bicarbonate (ZEGERID) 40-1100 MG per capsule Take 1 capsule by mouth at bedtime.      . polyethylene glycol powder (MIRALAX) powder Take 17 g by mouth as needed.      . potassium chloride SA (K-DUR,KLOR-CON) 20 MEQ tablet Take 1 tablet (20 mEq total) by mouth daily. 30 tablet 11  . TRAZODONE HCL PO Take 25 mg by mouth at bedtime.    . TUBERCULIN SYR 1CC/25GX5/8" (B-D TB SYRINGE 1CC/25GX5/8") 25G X 5/8" 1 ML MISC Use as directed with allergy injections 100 each 0  . [DISCONTINUED] amLODipine (NORVASC) 5 MG tablet Take 1 tablet by mouth daily.     No current facility-administered medications for this visit.    Allergies  Allergen Reactions  . Cephalexin   . Doxycycline   . Entex     REACTION: unknown  . Gabapentin     REACTION: unknown  . Levofloxacin     REACTION: unknown  . Lisinopril   . Losartan Potassium   . Metformin     REACTION: unknown  . Pregabalin     REACTION: unknown  . Rosiglitazone Maleate     REACTION: unknown  . Simvastatin     REACTION: unknown  . Sitagliptin Phosphate     REACTION: unknown    Review of Systems negative except from HPI and PMH  Physical Exam BP 147/65 mmHg  Pulse 93  Ht  (1.6 m)  Wt 149 lb 3.2 oz (67.677 kg)  BMI 26.44 kg/m2 Alert and oriented in no acute distress HENT- normal except for ongoing sniffling Eyes- EOMI, without scleral icterus Skin- warm and dry; without rashes LN-neg Neck- supple without thyromegaly, JVP-flat, carotids brisk and full without bruits Back-without CVAT or kyphosis Device pocket well healed; without hematoma or erythema.  There is no tethering Lungs-clear to auscultation CV-Regular rate and rhythm, nl S1 and S2, no murmurs gallops or rubs, S4-absent Abd-soft with active bowel sounds; no midline pulsation or hepatomegaly Pulses-intact femoral and distal Swelling and limited motion of  her left arm. There is rubric discoloration Neuro- Ax O, CN3-12 intact, grossly normal motor and sensory function Affect engaging   ECG SR with LBBB  Assessment and  Plan  Sinus node dysfunction the patient has sinus node dysfunction reasonable heart rate excursion. No refill rendered her device is indicated  Pacemaker-Medtronic  The patient's device was interrogated.  The information was reviewed. No changes were made in the programming.     Hypertension  she has significant hypertension although is well-controlled at this time.  Recent studies suggest that chlorthalidone is likely much more effective than hydrochlorthiazide; I will leave this changed to Dr. Katrinka Blazing.  The major issue  today is her left arm. It is swollen and limited range of motion. She has been seen by orthopedics, Dr. Annell Greening. She did not have an MRI because of her pacemaker. According to her it is not the present thinking that she has a rotator cuff. This leads to questions in my mind. The first is frozen shoulder which is what has been followed by physical therapy; the other concern is reflex sympathetic dystrophy as suggested by the swelling and discoloration. I am not sufficiently sophisticated in this differential diagnosis to make this distinction. Perhaps Dr. Ophelia Charter can assist  I don't think DVT of the arm would explain the limited motion although it might explain the swelling

## 2014-04-15 NOTE — Patient Instructions (Signed)
Your physician recommends that you continue on your current medications as directed. Please refer to the Current Medication list given to you today.  Remote monitoring is used to monitor your Pacemaker of ICD from home. This monitoring reduces the number of office visits required to check your device to one time per year. It allows us to keep an eye on the functioning of your device to ensure it is working properly. You are scheduled for a device check from home on 07/15/14. You may send your transmission at any time that day. If you have a wireless device, the transmission will be sent automatically. After your physician reviews your transmission, you will receive a postcard with your next transmission date.  Your physician wants you to follow-up in: 1 year with Dr. Klein.  You will receive a reminder letter in the mail two months in advance. If you don't receive a letter, please call our office to schedule the follow-up appointment.  

## 2014-04-16 DIAGNOSIS — H04122 Dry eye syndrome of left lacrimal gland: Secondary | ICD-10-CM | POA: Diagnosis not present

## 2014-04-16 DIAGNOSIS — H26491 Other secondary cataract, right eye: Secondary | ICD-10-CM | POA: Diagnosis not present

## 2014-04-16 DIAGNOSIS — H04121 Dry eye syndrome of right lacrimal gland: Secondary | ICD-10-CM | POA: Diagnosis not present

## 2014-04-16 DIAGNOSIS — Z961 Presence of intraocular lens: Secondary | ICD-10-CM | POA: Diagnosis not present

## 2014-05-14 ENCOUNTER — Other Ambulatory Visit: Payer: Self-pay | Admitting: Interventional Cardiology

## 2014-05-22 ENCOUNTER — Ambulatory Visit (INDEPENDENT_AMBULATORY_CARE_PROVIDER_SITE_OTHER): Payer: Medicare Other

## 2014-05-22 DIAGNOSIS — J309 Allergic rhinitis, unspecified: Secondary | ICD-10-CM

## 2014-06-09 DIAGNOSIS — H26492 Other secondary cataract, left eye: Secondary | ICD-10-CM | POA: Diagnosis not present

## 2014-06-10 ENCOUNTER — Telehealth: Payer: Self-pay | Admitting: Internal Medicine

## 2014-06-10 MED ORDER — HYDROXYZINE HCL 10 MG PO TABS: 10.0000 mg | ORAL_TABLET | Freq: Three times a day (TID) | ORAL | Status: DC | PRN

## 2014-06-10 NOTE — Telephone Encounter (Signed)
Spoke with pt, aware of refill.  Verified pharmacy.  Nothing further needed.

## 2014-06-10 NOTE — Telephone Encounter (Signed)
Pt calling for refill of Hydroxyzine  Pt reports that the body itching started about 3-4 days ago.  Pt states that he face and eyes are itching a lot - saw Eye Dr and was advised to contact Allergy Physician to discuss.  Please advise Dr Maple HudsonYoung if okay to refill. Thanks.

## 2014-06-10 NOTE — Telephone Encounter (Signed)
Ok to refill hydroxyzine.

## 2014-06-19 ENCOUNTER — Other Ambulatory Visit: Payer: Self-pay | Admitting: Interventional Cardiology

## 2014-07-09 DIAGNOSIS — I2510 Atherosclerotic heart disease of native coronary artery without angina pectoris: Secondary | ICD-10-CM | POA: Diagnosis not present

## 2014-07-09 DIAGNOSIS — E780 Pure hypercholesterolemia: Secondary | ICD-10-CM | POA: Diagnosis not present

## 2014-07-09 DIAGNOSIS — I10 Essential (primary) hypertension: Secondary | ICD-10-CM | POA: Diagnosis not present

## 2014-07-09 DIAGNOSIS — E1165 Type 2 diabetes mellitus with hyperglycemia: Secondary | ICD-10-CM | POA: Diagnosis not present

## 2014-07-10 ENCOUNTER — Other Ambulatory Visit: Payer: Self-pay | Admitting: Interventional Cardiology

## 2014-07-10 DIAGNOSIS — I6523 Occlusion and stenosis of bilateral carotid arteries: Secondary | ICD-10-CM

## 2014-07-15 ENCOUNTER — Ambulatory Visit (INDEPENDENT_AMBULATORY_CARE_PROVIDER_SITE_OTHER): Payer: Medicare Other | Admitting: *Deleted

## 2014-07-15 DIAGNOSIS — I495 Sick sinus syndrome: Secondary | ICD-10-CM

## 2014-07-15 NOTE — Progress Notes (Signed)
Remote pacemaker transmission.   

## 2014-07-17 LAB — CUP PACEART REMOTE DEVICE CHECK
Battery Impedance: 906 Ohm
Battery Voltage: 2.74 V
Brady Statistic AP VP Percent: 31 %
Brady Statistic AP VS Percent: 2 %
Brady Statistic AS VP Percent: 58 %
Date Time Interrogation Session: 20160510140832
Lead Channel Impedance Value: 750 Ohm
Lead Channel Pacing Threshold Amplitude: 0.375 V
Lead Channel Pacing Threshold Pulse Width: 0.4 ms
Lead Channel Sensing Intrinsic Amplitude: 2.8 mV
Lead Channel Setting Pacing Amplitude: 2.5 V
Lead Channel Setting Pacing Pulse Width: 0.4 ms
MDC IDC MSMT BATTERY REMAINING LONGEVITY: 46 mo
MDC IDC MSMT LEADCHNL RA IMPEDANCE VALUE: 487 Ohm
MDC IDC MSMT LEADCHNL RA PACING THRESHOLD PULSEWIDTH: 0.4 ms
MDC IDC MSMT LEADCHNL RV PACING THRESHOLD AMPLITUDE: 0.5 V
MDC IDC SET LEADCHNL RA PACING AMPLITUDE: 2 V
MDC IDC SET LEADCHNL RV SENSING SENSITIVITY: 5.6 mV
MDC IDC STAT BRADY AS VS PERCENT: 9 %

## 2014-07-28 ENCOUNTER — Encounter: Payer: Self-pay | Admitting: Cardiology

## 2014-08-06 ENCOUNTER — Encounter: Payer: Self-pay | Admitting: Internal Medicine

## 2014-09-03 NOTE — Progress Notes (Signed)
Cardiology Office Note   Date:  09/04/2014   ID:  [name], [name] 1945-11-11, MRN [id number]  PCP:  Lupe Carney, MD  Cardiologist:  Lesleigh Noe, MD   Chief Complaint  Patient presents with  . Hypertension      History of Present Illness: [name] is a 69 y.o. female who presents for permanent pacemaker follow-up, known left bundle branch block, hypertension, and history of pulmonary embolism.  She has had recent difficulty with dental implants. No cardiac problems. She has occasional palpitations. She has not had syncope or chest pain. She denies orthopnea and edema.  Follow-up carotid Doppler today demonstrates an estimated left internal carotid stenosis of 80-99% and right carotid stenosis in the 60-79% range. The left carotid has progressed since the last study. Patient has a prior history of CVA.  Past Medical History  Diagnosis Date  . Total knee replacement status   . Disorders of sacrum   . Depressive disorder, not elsewhere classified   . LBBB (left bundle branch block)   . Pain in joint, lower leg   . Unspecified asthma(493.90)   . Cardiac pacemaker medtronic   . Chronotropic incompetence with sinus node dysfunction   . Obstructive sleep apnea (adult) (pediatric)   . Anxiety state, unspecified   . Headache(784.0)   . Unspecified cerebral artery occlusion with cerebral infarction   . Type II or unspecified type diabetes mellitus without mention of complication, not stated as uncontrolled   . Other voice and resonance disorders   . Bronchitis, not specified as acute or chronic   . Allergic rhinitis, cause unspecified   . Pulmonary embolism   . Myalgia and myositis, unspecified   . GERD (gastroesophageal reflux disease)   . Hypertension     Past Surgical History  Procedure Laterality Date  . Total knee arthroplasty  2002    right  . Total knee repair  2010    right  . Vesicovaginal fistula closure w/ tah  1974  . Bilateral  salpingoophorectomy  1991  . Cholecystectomy    . Nasal fracture surgery    . Temporomandibular joint arthroplasty  1982  . Esophagogastroduodenoscopy  11/02/2011    Procedure: ESOPHAGOGASTRODUODENOSCOPY (EGD);  Surgeon: Vertell Novak., MD;  Location[facility];  Service: Endoscopy;  Laterality: N/A;  with xray  . Savory dilation  11/02/2011    Procedure: SAVORY DILATION;  Surgeon: Vertell Novak., MD;  [facility];  Service: Endoscopy;  Laterality: N/A;  . Esophagogastroduodenoscopy N/A 06/26/2013    Procedure: ESOPHAGOGASTRODUODENOSCOPY (EGD);  Surgeon: Vertell Novak., MD;  Location[facility];  Service: Endoscopy;  Laterality: N/A;  . Balloon dilation N/A 06/26/2013    Procedure: BALLOON DILATION;  Surgeon: Vertell Novak., MD;  Location: [facility];  Service: Endoscopy;  Laterality: N/A;  . Colonoscopy N/A 06/26/2013    Procedure: COLONOSCOPY;  Surgeon: Vertell Novak., MD;  Location[facility];  Service: Endoscopy;  Laterality: N/A;     Current Outpatient Prescriptions  Medication Sig Dispense Refill  . aspirin 81 MG tablet Take 81 mg by mouth daily.      Marland Kitchen atorvastatin (LIPITOR) 20 MG tablet Take 20 mg by mouth daily.      . BD PEN NEEDLE NANO U/F 32G X 4 MM MISC Use as directed for Insulin Injections    . BYSTOLIC 10 MG tablet TAKE 1 TABLET DAILY 90 tablet 1  . chlorhexidine (PERIDEX) 0.12 % solution  2 (two) times daily. AS DIRECTED ON BOTTLE  1  . cycloSPORINE (RESTASIS) 0.05 % ophthalmic emulsion Place 1 drop into both eyes 2 (two) times daily.    Marland Kitchen. diltiazem (CARDIZEM) 120 MG tablet Take 120 mg by mouth daily.    . DULoxetine (CYMBALTA) 30 MG capsule Take 90 mg by mouth daily.     Marland Kitchen. EPINEPHrine 0.3 mg/0.3 mL IJ SOAJ injection Inject 0.3 mg into the muscle as needed (FOR ALLERGIES).    . hydrochlorothiazide (HYDRODIURIL) 25 MG tablet Take 12.5 mg by mouth daily.    . hydrOXYzine (ATARAX/VISTARIL) 10 MG tablet Take 1 tablet (10 mg total) by  mouth every 8 (eight) hours as needed for itching. 30 tablet 0  . insulin detemir (LEVEMIR) 100 UNIT/ML injection 30 units in the morning and 60 units in the evening; currently in adjusting phase with Endocrinology, not sure of dosing at this time, changes every 2 weeks.Marland Kitchen.Marland Kitchen.Sliding scale    . loratadine (CLARITIN) 10 MG tablet Take 10 mg by mouth daily.    . Omega-3 Fatty Acids (FISH OIL) 1200 MG CAPS Take 2 capsules by mouth daily.      Marland Kitchen. omeprazole-sodium bicarbonate (ZEGERID) 40-1100 MG per capsule Take 1 capsule by mouth at bedtime.      . polyethylene glycol powder (MIRALAX) powder Take 17 g by mouth as needed for mild constipation.     . potassium chloride SA (K-DUR,KLOR-CON) 20 MEQ tablet Take 1 tablet (20 mEq total) by mouth daily. 30 tablet 11  . TRAZODONE HCL PO Take 25 mg by mouth at bedtime.    . TUBERCULIN SYR 1CC/25GX5/8" (B-D TB SYRINGE 1CC/25GX5/8") 25G X 5/8" 1 ML MISC Use as directed with allergy injections 100 each 0  . [DISCONTINUED] amLODipine (NORVASC) 5 MG tablet Take 1 tablet by mouth daily.     No current facility-administered medications for this visit.    Allergies:   Cephalexin; Doxycycline; Entex; Gabapentin; Levofloxacin; Lisinopril; Losartan potassium; Metformin; Pregabalin; Rosiglitazone maleate; Simvastatin; and Sitagliptin phosphate    Social History:  The patient  reports that she quit smoking about 12 years ago. Her smoking use included Cigarettes. She has a 6 pack-year smoking history. She has never used smokeless tobacco. She reports that she does not drink alcohol or use illicit drugs.   Family History:  The patient's family history includes Alcohol abuse in her father; Aneurysm in her sister; Cancer in her father; Diabetes in her mother and sister; Heart attack in her mother; Heart disease in her mother and sister; Hypertension in her mother; Lupus in her sister; Stroke in her mother.    ROS:  Please see the history of present illness.   Otherwise, review  of systems are positive for recent infected dental implants. Some dyspnea with exertion. Occasional anxiety. No neurological complaints..   All other systems are reviewed and negative.    PHYSICAL EXAM: VS:  BP 154/72 mmHg  Pulse 66  Ht 5\' 3"  (1.6 m)  Wt 66.679 kg (147 lb)  BMI 26.05 kg/m2 , BMI Body mass index is 26.05 kg/(m^2). GEN: Well nourished, well developed, in no acute distress HEENT: normal Neck: no JVD, or masses. High pitched near continuous left carotid bruit. Cardiac: RRR; no murmurs, rubs, or gallops,no edema  Respiratory:  clear to auscultation bilaterally, normal work of breathing GI: soft, nontender, nondistended, + BS MS: no deformity or atrophy Skin: warm and dry, no rash Neuro:  Strength and sensation are intact Psych: euthymic mood, full affect   EKG:  EKG is ordered today. The ekg ordered today demonstrates sinus rhythm with ventricular pacing.   Recent Labs: No results found for requested labs within last 365 days.    Lipid Panel    Component Value Date/Time   CHOL  01/18/2007 0427    125        ATP III CLASSIFICATION:  <200     mg/dL   Desirable  191-478  mg/dL   Borderline High  >=295    mg/dL   High   TRIG 621* 30/86/5784 0427   HDL 39* 01/18/2007 0427   CHOLHDL 3.2 01/18/2007 0427   VLDL 36 01/18/2007 0427   LDLCALC  01/18/2007 0427    50        Total Cholesterol/HDL:CHD Risk Coronary Heart Disease Risk Table                     Men   Women  1/2 Average Risk   3.4   3.3      Wt Readings from Last 3 Encounters:  09/04/14 66.679 kg (147 lb)  04/15/14 67.677 kg (149 lb 3.2 oz)  12/18/13 65.409 kg (144 lb 3.2 oz)      Other studies Reviewed: Additional studies/ records that were reviewed today include: .    ASSESSMENT AND PLAN:  1. Cardiac pacemaker in situ Normal function  2. Chronotropic incompetence with sinus node dysfunction Treated with pacemaker  3. Essential hypertension Elevated blood pressure. Will intensify  therapy  4. Obstructive sleep apnea On Cipro after  5. Left carotid bruit Greater than 80% stenosis documented by today's carotid Doppler study    Current medicines are reviewed at length with the patient today.  The patient does not have concerns regarding medicines.  The following changes have been made:  With the blood pressure being elevated, we will escalate Bystolic dose to 20 mg daily.  We will refer the patient for consideration of carotid revascularization via the Crest II trial  Labs/ tests ordered today include:  No orders of the defined types were placed in this encounter.     Disposition:   FU with HS in 9 months  Signed, Lesleigh Noe, MD  09/04/2014 12:05 PM    [facility] [address] [place], [place], [place]  [address] Phone: [phone]; Fax: [phone]

## 2014-09-04 ENCOUNTER — Ambulatory Visit (INDEPENDENT_AMBULATORY_CARE_PROVIDER_SITE_OTHER): Payer: Medicare Other | Admitting: Interventional Cardiology

## 2014-09-04 ENCOUNTER — Ambulatory Visit (HOSPITAL_COMMUNITY): Payer: Medicare Other | Attending: Cardiology

## 2014-09-04 ENCOUNTER — Encounter: Payer: Self-pay | Admitting: Interventional Cardiology

## 2014-09-04 VITALS — BP 154/72 | HR 66 | Ht 63.0 in | Wt 147.0 lb

## 2014-09-04 DIAGNOSIS — I6523 Occlusion and stenosis of bilateral carotid arteries: Secondary | ICD-10-CM

## 2014-09-04 DIAGNOSIS — I495 Sick sinus syndrome: Secondary | ICD-10-CM

## 2014-09-04 DIAGNOSIS — Z950 Presence of cardiac pacemaker: Secondary | ICD-10-CM | POA: Diagnosis not present

## 2014-09-04 DIAGNOSIS — I10 Essential (primary) hypertension: Secondary | ICD-10-CM

## 2014-09-04 DIAGNOSIS — G4733 Obstructive sleep apnea (adult) (pediatric): Secondary | ICD-10-CM | POA: Diagnosis not present

## 2014-09-04 DIAGNOSIS — R0989 Other specified symptoms and signs involving the circulatory and respiratory systems: Secondary | ICD-10-CM

## 2014-09-04 MED ORDER — NEBIVOLOL HCL 20 MG PO TABS: 20.0000 mg | ORAL_TABLET | Freq: Every day | ORAL | Status: DC

## 2014-09-04 NOTE — Patient Instructions (Signed)
Medication Instructions:  Your physician has recommended you make the following change in your medication: INCREASE Bystolic to 20mg  daily. An Rx has been sent to your pharmacy  Labwork: None   Testing/Procedures: None   Follow-Up: [facility]ult for Crest 2 trial) (To be scheduled ASAP)  Your physician wants you to follow-up in: 9-12 months with Dr.Smith You will receive a reminder letter in the mail two months in advance. If you don't receive a letter, please call our office to schedule the follow-up appointment.   Any Other Special Instructions Will Be Listed Below (If Applicable).

## 2014-09-09 ENCOUNTER — Telehealth: Payer: Self-pay | Admitting: Interventional Cardiology

## 2014-09-09 NOTE — Telephone Encounter (Signed)
Go back to Bystolic 10 mg daily, starting tomorrow.

## 2014-09-09 NOTE — Telephone Encounter (Signed)
Returned call to patient Dr.Smith advised to go back to Bystolic 10 mg starting tomorrow.Advised to call back if needed.

## 2014-09-09 NOTE — Telephone Encounter (Signed)
Returned call to patient she stated she is unable to take Bystolic 20 mg daily.Stated she took her 2nd tablet this morning and she is nauseated,dizzy,decreased appetite,light headed.Advised not to take Bystolic tomorrow.I will send message to Dr.Smith for advice.

## 2014-09-09 NOTE — Telephone Encounter (Signed)
Pt c/o medication issue:   1. Name of Medication: Bystolic  2. How are you currently taking this medication (dosage and times per day)?   3. Are you having a reaction (difficulty breathing--STAT)? YES, But she can handle it right now. She gets really restless almost like anxiety  4. What is your medication issue? Pt states that she was advised to Increase Bystolic medication until 07/06 with Allyson SabalBerry. Pt states that the medication has made her nauseated, Pt reports that she cannot eat has, dizziness and feels as if she is "slothful or near death"

## 2014-09-10 ENCOUNTER — Telehealth: Payer: Self-pay | Admitting: Interventional Cardiology

## 2014-09-10 ENCOUNTER — Encounter: Payer: Self-pay | Admitting: Cardiovascular Disease

## 2014-09-10 ENCOUNTER — Ambulatory Visit (INDEPENDENT_AMBULATORY_CARE_PROVIDER_SITE_OTHER): Payer: Medicare Other | Admitting: Cardiovascular Disease

## 2014-09-10 VITALS — BP 138/58 | HR 68 | Ht 63.0 in | Wt 148.0 lb

## 2014-09-10 DIAGNOSIS — R0989 Other specified symptoms and signs involving the circulatory and respiratory systems: Secondary | ICD-10-CM

## 2014-09-10 DIAGNOSIS — I6523 Occlusion and stenosis of bilateral carotid arteries: Secondary | ICD-10-CM

## 2014-09-10 DIAGNOSIS — I6529 Occlusion and stenosis of unspecified carotid artery: Secondary | ICD-10-CM | POA: Diagnosis not present

## 2014-09-10 NOTE — Telephone Encounter (Signed)
Spoke with patient and she has not had any Bystolic since yesterday am and still very nauseated, actually threw up last night Patient denies having a gallbladder or dark emesis Did discuss with Orma FlamingSally Pharm D and since last dose was yesterday am around 9:00 she should be feeling better Patient does not want to see PCP and feels secondary to Bystolic Patient would also like to discuss her ov with Dr Katrinka BlazingSmith, explained he was not currently in the office Will forward to Dr Katrinka BlazingSmith for review

## 2014-09-10 NOTE — Telephone Encounter (Signed)
Spoke to the patient concerning these matters. No Bystolic tonight and she needs to see PCP if nausea persists.

## 2014-09-10 NOTE — Assessment & Plan Note (Signed)
[name] was sent to me by Dr. Katrinka BlazingSmith for evaluation of high-grade asystematic left ICA stenosis.recent Dopplers performed 09/04/14 revealed moderate right and greater than 80% left ICA stenosis. The patient is asymptomatic. We discussed the CREST 2  trial specifically revascularization versus medical therapy. The patient really had family members who have died of strokes. She is hesitant to the enrollment trial and randomized to medical therapy. She is not a candidate for carotid artery stenting outside of a randomized trial since she is asymptomatic. I'm going to refer her to Dr. Myra GianottiBrabham for consideration of endarterectomy.

## 2014-09-10 NOTE — Patient Instructions (Addendum)
Dr Allyson SabalBerry has referred you to Dr Coral ElseVance Brabham for carotid endarterectomy. Carotid Endarterectomy  A carotid endarterectomy is a surgery to remove a blockage in the carotid arteries. The carotid arteries are the large blood vessels on both sides of the neck that supply blood to the brain. Carotid artery disease, also called carotid artery stenosis, is the narrowing or blockage of one or both carotid arteries. Carotid artery disease is usually caused by atherosclerosis, which is a buildup of fat and plaque in the arteries. Some plaque buildup normally occurs with aging. The plaque may partially or totally block blood flow or cause a clot to form in the carotid arteries.  LET [facility] PROVID[facility] KNOW ABOUT:  Any allergies you have.   All medicines you are taking, including vitamins, herbs, eye drops, creams, and over-the-counter medicines.   Use of steroids (by mouth or creams).   Previous problems you or members of your family have had with the use of anesthetics.   Any blood disorders you have.   Previous surgeries you have had.   Medical conditions you have, including diabetes and kidney problems.   Possibility of pregnancy, if this applies.  RISKS AND COMPLICATIONS Generally, carotid endarterectomy is a safe procedure. However, problems can occur and include:  Bleeding.   Infection.   Transient ischemic attacks (TIAs). A TIA results from poor blood flow to the brain, but there is no permanent loss of brain function.   Stroke.   Heart attack (myocardial infarction).   High blood pressure (hypertension).   Injury to nerves near the carotid arteries.  BEFORE THE PROCEDURE   Ask your health care provider about changing or stopping your regular medicines. This is especially important if you are taking diabetes medicines or blood thinners.  Do not eat or drink anything after midnight on the night before the procedure or as directed by your health care  provider. Ask your health care provider if it is okay to take any needed medicines with a sip of water.   Do not smoke for as long as possible before the surgery. Smoking will increase the chance of a healing problem after surgery.   You may need to have blood tests, a test to check heart rhythm (electrocardiography), or a test to check blood flow (angiography) done before the surgery.  PROCEDURE   You will be given a medicine that makes you fall asleep (general anesthetic).  After you receive the anesthetic, the surgeon will make a small cut (incision) in your neck to expose the carotid artery.  A tube may be inserted into the carotid artery above and below the blockage. This tube will temporarily allow blood to flow around the blockage during the surgery.  An incision will be made in the carotid artery at the location of the blockage, and the blockage will then be removed. In some cases, a section of the carotid artery may be removed and a graft patch used to repair the artery.  The carotid artery will then be closed with stitches (sutures).  If a tube was inserted into the artery to allow blood flow around the blockage during surgery, the tube will be removed. With the tube removal, blood flow to the brain will be restored through the carotid artery.  The incision in the neck will then be closed with sutures. AFTER THE PROCEDURE  You may have some pain or an ache in your neck for a couple weeks. This is normal.  Document Released: 10/24/2012 Document Revised:  07/08/2013 Document Reviewed: 10/24/2012 Exit[employer] Patient Information 2015 [place], [place]. This information is not intended to replace advice given to you by your health care provider. Make sure you discuss any questions you have with your health care provider.

## 2014-09-10 NOTE — Telephone Encounter (Signed)
New Message  Pt c/o medication issue:  1. Name of Medication: Bystolic   2. How are you currently taking this medication (dosage and times per day)? Dosage was dropped down to 10mg   3. Are you having a reaction (difficulty breathing--STAT)? No, not much  4. What is your medication issue? Pt states that the medication is still makling her feel very sick  Pt called states that she is still feeling Nauseated. Pt had an appt with Dr. Allyson SabalBerry. She states that his recommendations are not going to work! She would like to speak with Dr. Katrinka BlazingSmith to discuss an alternative for the Bystolic medication

## 2014-09-10 NOTE — Progress Notes (Signed)
09/10/2014 [name]   1946-03-06  [id number]  Primary Physician [name] Carney, MD Primary Cardiologist: [name] Gess MD [name]   HPI:  [name] is a 69 year old mildly overweight married Caucasian female patient of [name]'s is accompanied by her husband [name] today. She was referred for evaluation of asymptomatic high-grade left ICA stenosis.her other problems include history of chronotropic incompetence and sinus node dysfunction status post permanent transvenous pacemaker implantation. She has a remote history of pulmonary embolism. She also has a history of hypertension and diabetes. There is a question of her a remote history of cerebral bleeding in 2001. Recent carotid Dopplers performed 09/04/14 revealed moderate right and high-grade left ICA stenosis. The patient is a is asymptomatic  from this.   Current Outpatient Prescriptions  Medication Sig Dispense Refill  . aspirin 81 MG tablet Take 81 mg by mouth daily.      Marland Kitchen atorvastatin (LIPITOR) 20 MG tablet Take 20 mg by mouth daily.      . BD PEN NEEDLE NANO U/F 32G X 4 MM MISC Use as directed for Insulin Injections    . chlorhexidine (PERIDEX) 0.12 % solution 2 (two) times daily. AS DIRECTED ON BOTTLE  1  . cycloSPORINE (RESTASIS) 0.05 % ophthalmic emulsion Place 1 drop into both eyes 2 (two) times daily.    Marland Kitchen diltiazem (CARDIZEM) 120 MG tablet Take 120 mg by mouth daily.    . DULoxetine (CYMBALTA) 30 MG capsule Take 90 mg by mouth daily.     Marland Kitchen EPINEPHrine 0.3 mg/0.3 mL IJ SOAJ injection Inject 0.3 mg into the muscle as needed (FOR ALLERGIES).    . hydrochlorothiazide (HYDRODIURIL) 25 MG tablet Take 12.5 mg by mouth daily.    . hydrOXYzine (ATARAX/VISTARIL) 10 MG tablet Take 1 tablet (10 mg total) by mouth every 8 (eight) hours as needed for itching. 30 tablet 0  . insulin detemir (LEVEMIR) 100 UNIT/ML injection 30 units in the morning and 60 units in the evening; currently in adjusting phase with  Endocrinology, not sure of dosing at this time, changes every 2 weeks.Marland KitchenMarland KitchenSliding scale    . loratadine (CLARITIN) 10 MG tablet Take 10 mg by mouth daily.    . nebivolol (BYSTOLIC) 10 MG tablet Take 1 tablet (10 mg total) by mouth daily. 30 tablet 6  . Omega-3 Fatty Acids (FISH OIL) 1200 MG CAPS Take 2 capsules by mouth daily.      Marland Kitchen omeprazole-sodium bicarbonate (ZEGERID) 40-1100 MG per capsule Take 1 capsule by mouth at bedtime.      . polyethylene glycol powder (MIRALAX) powder Take 17 g by mouth as needed for mild constipation.     . potassium chloride SA (K-DUR,KLOR-CON) 20 MEQ tablet Take 1 tablet (20 mEq total) by mouth daily. 30 tablet 11  . TRAZODONE HCL PO Take 25 mg by mouth at bedtime.    . TUBERCULIN SYR 1CC/25GX5/8" (B-D TB SYRINGE 1CC/25GX5/8") 25G X 5/8" 1 ML MISC Use as directed with allergy injections 100 each 0  . [DISCONTINUED] amLODipine (NORVASC) 5 MG tablet Take 1 tablet by mouth daily.     No current facility-administered medications for this visit.    Allergies  Allergen Reactions  . Atenolol     Light headed/hair loss  . Cephalexin Swelling    Tongue and lip swelling  . Chromic Sulfate     Chromic sutures  . Codeine Nausea Only  . Desmopressin   . Dexilant [Dexlansoprazole] Diarrhea  . Doxycycline Nausea  And Vomiting  . Entex     REACTION: unknown  . Farxiga [Dapagliflozin]   . Gabapentin     REACTION: unknown  . Levaquin [Levofloxacin] Other (See Comments)    GI upset  . Lexapro [Escitalopram]     Weight gain  . Lisinopril   . Losartan Potassium   . Metformin     REACTION: unknown  . Other     Bellaryl  . Pregabalin     REACTION: unknown  . Rosiglitazone Maleate     REACTION: unknown  . Simvastatin     REACTION: unknown  . Sitagliptin Phosphate     REACTION: unknown  . Tradjenta [Linagliptin] Other (See Comments)    Joint pain    History   Social History  . Marital Status: Married    Spouse Name: N/A  . Number of Children: N/A  .  Years of Education: N/A   Occupational History  . Not on file.   Social History Main Topics  . Smoking status: Former Smoker -- 0.30 packs/day for 20 years    Types: Cigarettes    Quit date: 09/12/2001  . Smokeless tobacco: Never Used     Comment: pt smoked less than 1/4 ppd  . Alcohol Use: No  . Drug Use: No  . Sexual Activity: Not on file   Other Topics Concern  . Not on file   Social History Narrative     Review of Systems: General: negative for chills, fever, night sweats or weight changes.  Cardiovascular: negative for chest pain, dyspnea on exertion, edema, orthopnea, palpitations, paroxysmal nocturnal dyspnea or shortness of breath Dermatological: negative for rash Respiratory: negative for cough or wheezing Urologic: negative for hematuria Abdominal: negative for nausea, vomiting, diarrhea, bright red blood per rectum, melena, or hematemesis Neurologic: negative for visual changes, syncope, or dizziness All other systems reviewed and are otherwise negative except as noted above.    Blood pressure 138/58, pulse 68, height 5\' 3"  (1.6 m), weight 148 lb (67.132 kg).  General appearance: alert and no distress Neck: no adenopathy, no JVD, supple, symmetrical, trachea midline, thyroid not enlarged, symmetric, no tenderness/mass/nodules and soft left carotid bruit Lungs: clear to auscultation bilaterally Heart: regular rate and rhythm, S1, S2 normal, no murmur, click, rub or gallop Extremities: extremities normal, atraumatic, no cyanosis or edema  EKG not performed today  ASSESSMENT AND PLAN:   Left carotid bruit [name] was sent to me by Dr. Katrinka [name] for evaluation of high-grade asystematic left ICA stenosis.recent Dopplers performed 09/04/14 revealed moderate right and greater than 80% left ICA stenosis. The patient is asymptomatic. We discussed the CREST 2  trial specifically revascularization versus medical therapy. The patient really had family members who have died  of strokes. She is hesitant to the enrollment trial and randomized to medical therapy. She is not a candidate for carotid artery stenting outside of a randomized trial since she is asymptomatic. I'm going to refer her to Dr. Myra GianottiBrabham for consideration of endarterectomy.      [name] GessJonathan J. Naira Standiford MD FACP,FACC,FAHA, [facility]SCAI 09/10/2014 3:19 PM

## 2014-09-12 ENCOUNTER — Telehealth: Payer: Self-pay | Admitting: Internal Medicine

## 2014-09-12 ENCOUNTER — Ambulatory Visit (INDEPENDENT_AMBULATORY_CARE_PROVIDER_SITE_OTHER): Payer: Medicare Other

## 2014-09-12 ENCOUNTER — Other Ambulatory Visit: Payer: Self-pay | Admitting: *Deleted

## 2014-09-12 DIAGNOSIS — I6522 Occlusion and stenosis of left carotid artery: Secondary | ICD-10-CM

## 2014-09-12 DIAGNOSIS — J309 Allergic rhinitis, unspecified: Secondary | ICD-10-CM | POA: Diagnosis not present

## 2014-09-12 NOTE — Telephone Encounter (Signed)
Date Mixed: 09/11/2014 Vial: AB Strength: 1:50 Here/Mail/Pick Up: Mail Mixed By: Dimas Millinammy Scott

## 2014-10-14 ENCOUNTER — Ambulatory Visit (INDEPENDENT_AMBULATORY_CARE_PROVIDER_SITE_OTHER): Payer: Medicare Other | Admitting: *Deleted

## 2014-10-14 DIAGNOSIS — I495 Sick sinus syndrome: Secondary | ICD-10-CM

## 2014-10-14 NOTE — Progress Notes (Signed)
Remote pacemaker transmission.   

## 2014-10-15 DIAGNOSIS — F329 Major depressive disorder, single episode, unspecified: Secondary | ICD-10-CM | POA: Diagnosis not present

## 2014-10-15 DIAGNOSIS — E780 Pure hypercholesterolemia: Secondary | ICD-10-CM | POA: Diagnosis not present

## 2014-10-15 DIAGNOSIS — I10 Essential (primary) hypertension: Secondary | ICD-10-CM | POA: Diagnosis not present

## 2014-10-15 DIAGNOSIS — M150 Primary generalized (osteo)arthritis: Secondary | ICD-10-CM | POA: Diagnosis not present

## 2014-10-15 DIAGNOSIS — K219 Gastro-esophageal reflux disease without esophagitis: Secondary | ICD-10-CM | POA: Diagnosis not present

## 2014-10-17 ENCOUNTER — Encounter: Payer: Self-pay | Admitting: Surgery

## 2014-10-17 ENCOUNTER — Encounter: Payer: Self-pay | Admitting: Interventional Cardiology

## 2014-10-20 ENCOUNTER — Ambulatory Visit (INDEPENDENT_AMBULATORY_CARE_PROVIDER_SITE_OTHER): Payer: Medicare Other | Admitting: Surgery

## 2014-10-20 ENCOUNTER — Other Ambulatory Visit: Payer: Self-pay

## 2014-10-20 ENCOUNTER — Ambulatory Visit (HOSPITAL_COMMUNITY)
Admission: RE | Admit: 2014-10-20 | Discharge: 2014-10-20 | Disposition: A | Discharge status: Home / Self Care | Payer: Medicare Other | Source: Ambulatory Visit | Attending: Surgery | Admitting: Surgery

## 2014-10-20 ENCOUNTER — Encounter: Payer: Self-pay | Admitting: Surgery

## 2014-10-20 VITALS — BP 150/53 | HR 65 | Temp 98.1°F | Ht 63.0 in | Wt 148.4 lb

## 2014-10-20 DIAGNOSIS — I6522 Occlusion and stenosis of left carotid artery: Secondary | ICD-10-CM | POA: Insufficient documentation

## 2014-10-20 DIAGNOSIS — I6523 Occlusion and stenosis of bilateral carotid arteries: Secondary | ICD-10-CM

## 2014-10-20 LAB — CUP PACEART REMOTE DEVICE CHECK
Brady Statistic AP VS Percent: 1 %
Brady Statistic AS VP Percent: 60 %
Brady Statistic AS VS Percent: 5 %
Lead Channel Impedance Value: 767 Ohm
Lead Channel Pacing Threshold Amplitude: 0.375 V
Lead Channel Pacing Threshold Amplitude: 0.625 V
Lead Channel Pacing Threshold Pulse Width: 0.4 ms
Lead Channel Pacing Threshold Pulse Width: 0.4 ms
Lead Channel Setting Pacing Amplitude: 2.5 V
Lead Channel Setting Sensing Sensitivity: 5.6 mV
MDC IDC MSMT BATTERY IMPEDANCE: 1036 Ohm
MDC IDC MSMT BATTERY REMAINING LONGEVITY: 42 mo
MDC IDC MSMT BATTERY VOLTAGE: 2.74 V
MDC IDC MSMT LEADCHNL RA IMPEDANCE VALUE: 507 Ohm
MDC IDC MSMT LEADCHNL RA SENSING INTR AMPL: 2.8 mV
MDC IDC SESS DTM: 20160809143958
MDC IDC SET LEADCHNL RA PACING AMPLITUDE: 2 V
MDC IDC SET LEADCHNL RV PACING PULSEWIDTH: 0.4 ms
MDC IDC STAT BRADY AP VP PERCENT: 34 %

## 2014-10-20 NOTE — Progress Notes (Signed)
   Patient name: [name] MRN: [id number] DOB: 06/25/1945 Sex: female   Referred by: Dr. Berry  Reason for referral:  Chief Complaint  Patient presents with  . New Evaluation    eval carotid stenosis     HISTORY OF PRESENT ILLNESS:  this is a 69-year-old female that is referred today for carotid stenosis. The patient has been followed with serial carotid ultrasounds and has now progressed to greater than 80% on the left.  She is asymptomatic.  Specifically, she denies numbness or weakness in either extremity.  She denies slurred speech.  She denies amaurosis fugax.  The patient does report having had a brain bleed in the past.   The patient is a left bundle branch block.  She has a pacemaker in place. The patient has a history of pulmonary embolism following knee surgery. She is not on anti- coagulation.  She is a diabetic.  Her hemoglobin A1c is 8.2.  She is medically managed for hypercholesterolemia with a statin.  She is a former smoker, having quit in 2004.  Past Medical History  Diagnosis Date  . Total knee replacement status   . Disorders of sacrum   . Depressive disorder, not elsewhere classified   . LBBB (left bundle branch block)   . Pain in joint, lower leg   . Unspecified asthma(493.90)   . Cardiac pacemaker medtronic   . Chronotropic incompetence with sinus node dysfunction   . Obstructive sleep apnea (adult) (pediatric)   . Anxiety state, unspecified   . Headache(784.0)   . Unspecified cerebral artery occlusion with cerebral infarction     high-grade left ICA stenosis  . Type II or unspecified type diabetes mellitus without mention of complication, not stated as uncontrolled   . Other voice and resonance disorders   . Bronchitis, not specified as acute or chronic   . Allergic rhinitis, cause unspecified   . Pulmonary embolism   . Myalgia and myositis, unspecified   . GERD (gastroesophageal reflux disease)   . Hypertension   . Atrial fibrillation      Past Surgical History  Procedure Laterality Date  . Total knee arthroplasty  2002    right  . Total knee repair  2010    right  . Vesicovaginal fistula closure w/ tah  1974  . Bilateral salpingoophorectomy  1991  . Cholecystectomy    . Nasal fracture surgery    . Temporomandibular joint arthroplasty  1982  . Esophagogastroduodenoscopy  11/02/2011    Procedure: ESOPHAGOGASTRODUODENOSCOPY (EGD);  Surgeon: James L Edwards Jr., MD;  Location: [facility];  Service: Endoscopy;  Laterality: N/A;  with xray  . Savory dilation  11/02/2011    Procedure: SAVORY DILATION;  Surgeon: James L Edwards Jr., MD;  Location: [facility];  Service: Endoscopy;  Laterality: N/A;  . Esophagogastroduodenoscopy N/A 06/26/2013    Procedure: ESOPHAGOGASTRODUODENOSCOPY (EGD);  Surgeon: James L Edwards Jr., MD;  Location: [facility];  Service: Endoscopy;  Laterality: N/A;  . Balloon dilation N/A 06/26/2013    Procedure: BALLOON DILATION;  Surgeon: James L Edwards Jr., MD;  Location: [facility];  Service: Endoscopy;  Laterality: N/A;  . Colonoscopy N/A 06/26/2013    Procedure: COLONOSCOPY;  Surgeon: James L Edwards Jr., MD;  Location: [facility];  Service: Endoscopy;  Laterality: N/A;    Social History   Social History  . Marital Status: Married    Spouse Name: N/A  . Number of Children: N/A  . Years of Education: N/A   Occupational   History  . Not on file.   Social History Main Topics  . Smoking status: Former Smoker -- 0.30 packs/day for 20 years    Types: Cigarettes    Quit date: 09/12/2001  . Smokeless tobacco: Never Used     Comment: pt smoked less than 1/4 ppd  . Alcohol Use: No  . Drug Use: No  . Sexual Activity: Not on file   Other Topics Concern  . Not on file   Social History Narrative    Family History  Problem Relation Age of Onset  . Cancer Father     LUNG  . Alcohol abuse Father   . Diabetes Mother   . Heart disease Mother   . Stroke Mother   . Heart attack Mother    . Hypertension Mother   . Deep vein thrombosis Mother   . Hyperlipidemia Mother   . Varicose Veins Mother   . Peripheral vascular disease Mother     amputation  . Lupus Sister   . Diabetes Sister   . Aneurysm Sister   . Deep vein thrombosis Sister   . Hyperlipidemia Sister   . Hypertension Sister   . Varicose Veins Sister   . Heart disease Sister   . Deep vein thrombosis Sister   . Diabetes Sister   . Hyperlipidemia Sister   . Hypertension Sister   . Varicose Veins Sister     Allergies as of 10/20/2014 - Review Complete 10/20/2014  Allergen Reaction Noted  . Atenolol  09/10/2014  . Cephalexin Swelling 05/16/2007  . Chromic sulfate  09/10/2014  . Codeine Nausea Only 09/10/2014  . Desmopressin  09/10/2014  . Dexilant [dexlansoprazole] Diarrhea 09/10/2014  . Doxycycline Nausea And Vomiting   . Entex    . Farxiga [dapagliflozin]  09/10/2014  . Gabapentin    . Levaquin [levofloxacin] Other (See Comments)   . Lexapro [escitalopram]  09/10/2014  . Lisinopril    . Losartan potassium    . Metformin    . Other  09/10/2014  . Pregabalin    . Rosiglitazone maleate    . Simvastatin    . Sitagliptin phosphate    . Tradjenta [linagliptin] Other (See Comments) 09/10/2014    Current Outpatient Prescriptions on File Prior to Visit  Medication Sig Dispense Refill  . aspirin 81 MG tablet Take 81 mg by mouth daily.      . atorvastatin (LIPITOR) 20 MG tablet Take 20 mg by mouth daily.      . cycloSPORINE (RESTASIS) 0.05 % ophthalmic emulsion Place 1 drop into both eyes 2 (two) times daily.    . diltiazem (CARDIZEM) 120 MG tablet Take 120 mg by mouth daily.    . DULoxetine (CYMBALTA) 30 MG capsule Take 90 mg by mouth daily.     . hydrochlorothiazide (HYDRODIURIL) 25 MG tablet Take 12.5 mg by mouth daily.    . nebivolol (BYSTOLIC) 10 MG tablet Take 1 tablet (10 mg total) by mouth daily. 30 tablet 6  . Omega-3 Fatty Acids (FISH OIL) 1200 MG CAPS Take 2 capsules by mouth daily.      .  omeprazole-sodium bicarbonate (ZEGERID) 40-1100 MG per capsule Take 1 capsule by mouth at bedtime.      . TRAZODONE HCL PO Take 25 mg by mouth at bedtime.    . TUBERCULIN SYR 1CC/25GX5/8" (B-D TB SYRINGE 1CC/25GX5/8") 25G X 5/8" 1 ML MISC Use as directed with allergy injections 100 each 0  . BD PEN NEEDLE NANO U/F 32G X 4 MM MISC   Use as directed for Insulin Injections    . chlorhexidine (PERIDEX) 0.12 % solution 2 (two) times daily. AS DIRECTED ON BOTTLE  1  . EPINEPHrine 0.3 mg/0.3 mL IJ SOAJ injection Inject 0.3 mg into the muscle as needed (FOR ALLERGIES).    . hydrOXYzine (ATARAX/VISTARIL) 10 MG tablet Take 1 tablet (10 mg total) by mouth every 8 (eight) hours as needed for itching. (Patient not taking: Reported on 10/20/2014) 30 tablet 0  . insulin detemir (LEVEMIR) 100 UNIT/ML injection 30 units in the morning and 60 units in the evening; currently in adjusting phase with Endocrinology, not sure of dosing at this time, changes every 2 weeks...Sliding scale    . loratadine (CLARITIN) 10 MG tablet Take 10 mg by mouth daily.    . polyethylene glycol powder (MIRALAX) powder Take 17 g by mouth as needed for mild constipation.     . potassium chloride SA (K-DUR,KLOR-CON) 20 MEQ tablet Take 1 tablet (20 mEq total) by mouth daily. (Patient not taking: Reported on 10/20/2014) 30 tablet 11  . [DISCONTINUED] amLODipine (NORVASC) 5 MG tablet Take 1 tablet by mouth daily.     No current facility-administered medications on file prior to visit.     REVIEW OF SYSTEMS: Cardiovascular:  Positive for chest pain, chest pressure, palpitations, shortness of breath when lying flat and with exertion, pain in her legs when walking and lying flat. Positive for leg swelling, varicose veins and histor of DVT. Pulmonary:  Positive for productive cough, asthma  and wheezing. Neurologic:  Positive for weakness, paresthesias,  and dizziness. Hematologic: No bleeding problems or clotting disorders. Musculoskeletal: No  joint pain or joint swelling. Gastrointestinal: No blood in stool or hematemesis Genitourinary: No dysuria or hematuria. Psychiatric::  Positive forhistory of major depression. Integumentary: No rashes or ulcers. Constitutional: No fever or chills.  PHYSICAL EXAMINATION:  Filed Vitals:   10/20/14 1017 10/20/14 1025  BP: 149/60 150/53  Pulse: 68 65  Temp: 98.1 F (36.7 C)   TempSrc: Oral   Height: 5' 3" (1.6 m)   Weight: 148 lb 6.4 oz (67.314 kg)   SpO2: 98%    Body mass index is 26.29 kg/(m^2). General: The patient appears their stated age.   HEENT:  No gross abnormalities Pulmonary: Respirations are non-labored Abdomen: Soft and non-tender.  No pulsatile mass  Musculoskeletal: There are no major deformities.   Neurologic: No focal weakness or paresthesias are detected, Skin: There are no ulcer or rashes noted. Psychiatric: The patient has normal affect. Cardiovascular: There is a regular rate and rhythm without significant murmur appreciated.  Diagnostic Studies:  I have reviewed her carotid Doppler studies which showed greater than 80% left carotid stenosis and 60-79% right carotid stenosis    Assessment:   asymptomatic left carotid stenosis Plan: I discussed our treatment options which include but are not limited to stenting versus endarterectomy versus medical management. After a well-informed discussion, we have decided to proceed with left carotid endarterectomy. I discussed the risks and benefits including the risk of stroke, nerve injury, facial numbness, and cardiopulmonary complications. All her questions were answered.  Her operation has been scheduled for   Friday August 26    V. Wells Brabham IV, M.D. [facility] Office: [phone] Pager:  [phone]   

## 2014-10-27 DIAGNOSIS — E780 Pure hypercholesterolemia: Secondary | ICD-10-CM | POA: Diagnosis not present

## 2014-10-27 DIAGNOSIS — E1165 Type 2 diabetes mellitus with hyperglycemia: Secondary | ICD-10-CM | POA: Diagnosis not present

## 2014-10-27 DIAGNOSIS — I10 Essential (primary) hypertension: Secondary | ICD-10-CM | POA: Diagnosis not present

## 2014-10-27 DIAGNOSIS — I2510 Atherosclerotic heart disease of native coronary artery without angina pectoris: Secondary | ICD-10-CM | POA: Diagnosis not present

## 2014-10-27 NOTE — Progress Notes (Signed)
Received call from pt.  She wanted to make Dr. Myra Gianotti aware that she saw Dr. Talmage Nap, Endocrinology, this morning, and her Hgb AIC is 6.8.  Stated she had informed Dr. Myra Gianotti at her recent appt. that it was 8.2, and wanted him to know of the accurate reading.  Nurse informed pt. she will make Dr. Myra Gianotti aware.  Gave Dr. Myra Gianotti updated information on Hgb. AIC.

## 2014-10-28 ENCOUNTER — Encounter (HOSPITAL_COMMUNITY): Payer: Self-pay

## 2014-10-28 ENCOUNTER — Other Ambulatory Visit (HOSPITAL_COMMUNITY): Payer: Medicare Other

## 2014-10-28 ENCOUNTER — Encounter (HOSPITAL_COMMUNITY)
Admission: RE | Admit: 2014-10-28 | Discharge: 2014-10-28 | Disposition: A | Discharge status: Home / Self Care | Payer: Medicare Other | Source: Ambulatory Visit | Attending: Surgery | Admitting: Surgery

## 2014-10-28 HISTORY — DX: Personal history of other diseases of the digestive system: Z87.19

## 2014-10-28 HISTORY — DX: Dizziness and giddiness: R42

## 2014-10-28 HISTORY — DX: Presence of cardiac pacemaker: Z95.0

## 2014-10-28 HISTORY — DX: Cardiac murmur, unspecified: R01.1

## 2014-10-28 HISTORY — DX: Unspecified osteoarthritis, unspecified site: M19.90

## 2014-10-28 HISTORY — DX: Rheumatic fever without heart involvement: I00

## 2014-10-28 LAB — COMPREHENSIVE METABOLIC PANEL WITH GFR
ALT: 28 U/L (ref 14–54)
AST: 26 U/L (ref 15–41)
Albumin: 3.8 g/dL (ref 3.5–5.0)
Alkaline Phosphatase: 67 U/L (ref 38–126)
Anion gap: 7 (ref 5–15)
BUN: 15 mg/dL (ref 6–20)
CO2: 31 mmol/L (ref 22–32)
Calcium: 9.8 mg/dL (ref 8.9–10.3)
Chloride: 99 mmol/L — ABNORMAL LOW (ref 101–111)
Creatinine, Ser: 0.84 mg/dL (ref 0.44–1.00)
GFR calc Af Amer: >60 mL/min
GFR calc non Af Amer: >60 mL/min
Glucose, Bld: 111 mg/dL — ABNORMAL HIGH (ref 65–99)
Potassium: 3.7 mmol/L (ref 3.5–5.1)
Sodium: 137 mmol/L (ref 135–145)
Total Bilirubin: 0.7 mg/dL (ref 0.3–1.2)
Total Protein: 6.9 g/dL (ref 6.5–8.1)

## 2014-10-28 LAB — URINALYSIS, ROUTINE W REFLEX MICROSCOPIC
Bilirubin Urine: NEGATIVE
Glucose, UA: NEGATIVE mg/dL
Hgb urine dipstick: NEGATIVE
Ketones, ur: NEGATIVE mg/dL
Leukocytes, UA: NEGATIVE
Nitrite: NEGATIVE
Protein, ur: NEGATIVE mg/dL
Specific Gravity, Urine: 1.017 (ref 1.005–1.030)
Urobilinogen, UA: 0.2 mg/dL (ref 0.0–1.0)
pH: 5 (ref 5.0–8.0)

## 2014-10-28 LAB — CBC
HCT: 39 % (ref 36.0–46.0)
Hemoglobin: 13 g/dL (ref 12.0–15.0)
MCH: 27.9 pg (ref 26.0–34.0)
MCHC: 33.3 g/dL (ref 30.0–36.0)
MCV: 83.7 fL (ref 78.0–100.0)
Platelets: 219 K/uL (ref 150–400)
RBC: 4.66 MIL/uL (ref 3.87–5.11)
RDW: 14.9 % (ref 11.5–15.5)
WBC: 8.9 K/uL (ref 4.0–10.5)

## 2014-10-28 LAB — SURGICAL PCR SCREEN
MRSA, PCR: NEGATIVE
Staphylococcus aureus: NEGATIVE

## 2014-10-28 LAB — GLUCOSE, CAPILLARY: Glucose-Capillary: 129 mg/dL — ABNORMAL HIGH (ref 65–99)

## 2014-10-28 LAB — APTT: aPTT: 30 s (ref 24–37)

## 2014-10-28 LAB — TYPE AND SCREEN
ABO/RH(D): O NEG
Antibody Screen: NEGATIVE

## 2014-10-28 LAB — ABO/RH: ABO/RH(D): O NEG

## 2014-10-28 LAB — PROTIME-INR
INR: 1.05 (ref 0.00–1.49)
Prothrombin Time: 13.9 s (ref 11.6–15.2)

## 2014-10-28 NOTE — Progress Notes (Signed)
PCP- Pt. States that she currently does not have a PCP Cardiologist - Dr. Verdis Prime  EKG- June 2016- Epic CXR - denies  Echo- denies Stress- denies Cardiac Cath - denies  Pt. Denies chest pain and shortness of breath at PAT appointment.

## 2014-10-28 NOTE — Pre-Procedure Instructions (Addendum)
[name]  10/28/2014      [id number] [address] [place] [address] Phone: [phone] Fax: [phone]  [facility] - [place], [place] - [address] AT PORTAL TO REGISTERED [facility] SITES [address] [place] [address] Phone: [phone] Fax: [phone]  [facility] - [place], [place] - [address] [place] [address] Phone: [phone] Fax: [phone]    Your procedure is scheduled on Friday, August 26th, 2016  Report to [facility] Admitting at 5:30 A.M.                  Your procedure is scheduled for 7:30 AM.   Call this number if you have problems the morning of surgery:  [phone]   Remember:  Do not eat food or drink liquids after midnight.  Take these medicines the morning of surgery with A SIP OF WATER: Aspirin, Eye drops, Diltiazem (Cardizem), Duloxetine (Cymbalta), Loratadine (Claritin), Meclizine (Antivert) if needed, Nebivolol (Bystolic).     What do I do about my diabetes medications?   Do not take oral diabetes medicines (pills) the morning of surgery.  THE NIGHT BEFORE SURGERY, take 56 units of Levemir Insulin.    THE MORNING OF SURGERY, take 35 units of Levemir Insulin.   If your CBG is greater than 220 mg/dL, you may take 1/2 of your sliding scale (correction) dose of Novolog insulin.     Do not wear jewelry, make-up or nail polish.  Do not wear lotions, powders, or perfumes.  You may NOT wear deodorant.  Do not shave 48 hours prior to surgery.    Do not bring valuables to the hospital.  [facility] is not responsible for any belongings or valuables.  Contacts, dentures or bridgework may not be worn into surgery.  Leave your suitcase in the car.  After surgery it may be brought to your room.  For patients admitted to the hospital, discharge time will be determined by your treatment  team.  Patients discharged the day of surgery will not be allowed to drive home.   Special instructions:  See attached.   Please read over the following fact sheets that you were given. Pain Booklet, Coughing and Deep Breathing, Blood Transfusion Information, MRSA Information and Surgical Site Infection Prevention      How to Manage Your Diabetes Before Surgery   Why is it important to control my blood sugar before and after surgery?   Improving blood sugar levels before and after surgery helps healing and can limit problems.  A way of improving blood sugar control is eating a healthy diet by:  - Eating less sugar and carbohydrates  - Increasing activity/exercise  - Talk with your doctor about reaching your blood sugar goals  High blood sugars (greater than 180 mg/dL) can raise your risk of infections and slow down your recovery so you will need to focus on controlling your diabetes during the weeks before surgery.  Make sure that the doctor who takes care of your diabetes knows about your planned surgery including the date and location.  How do I manage my blood sugars before surgery?   Check your blood sugar at least 4 times a day, 2 days before surgery to make sure that they are not too high or low.   Check your blood sugar the morning of your surgery when you wake up and  every 2 hours until you get to the Short-Stay unit.  If your blood sugar is less than 70 mg/dL, you will need to treat for low blood sugar by:  Treat a low blood sugar (less than 70 mg/dL) with 1/2 cup of clear juice (cranberry or apple), 4 glucose tablets, OR glucose gel.  Recheck blood sugar in 15 minutes after treatment (to make sure it is greater than 70 mg/dL).  If blood sugar is not greater than 70 mg/dL on re-check, call [phone] for further instructions.   Report your blood sugar to the Short-Stay nurse when you get to Short-Stay.

## 2014-10-29 LAB — HEMOGLOBIN A1C
HEMOGLOBIN A1C: 7 % — AB (ref 4.8–5.6)
MEAN PLASMA GLUCOSE: 154 mg/dL

## 2014-10-30 MED ORDER — CHLORHEXIDINE GLUCONATE CLOTH 2 % EX PADS: 6.0000 | MEDICATED_PAD | Freq: Once | CUTANEOUS | Status: DC

## 2014-10-30 MED ORDER — VANCOMYCIN HCL IN DEXTROSE 1-5 GM/200ML-% IV SOLN
1000.0000 mg | INTRAVENOUS | Status: AC
  Administered 2014-10-31: 1000 mg via INTRAVENOUS
  Filled 2014-10-30: qty 200

## 2014-10-30 MED ORDER — SODIUM CHLORIDE 0.9 % IV SOLN
INTRAVENOUS | Status: DC
Start: 2014-10-31 — End: 2014-10-31

## 2014-10-30 NOTE — Anesthesia Preprocedure Evaluation (Addendum)
Anesthesia Evaluation  Patient identified by MRN, date of birth, ID band Patient awake    Reviewed: Allergy & Precautions, NPO status , Patient's Chart, lab work & pertinent test results  Airway Mallampati: II  TM Distance: >3 FB Neck ROM: Full    Dental  (+) Teeth Intact, Dental Advisory Given, Caps   Pulmonary asthma , sleep apnea , former smoker,  breath sounds clear to auscultation        Cardiovascular hypertension, Pt. on medications and Pt. on home beta blockers + Peripheral Vascular Disease + dysrhythmias + pacemaker Rhythm:Regular Rate:Normal     Neuro/Psych  Headaches, Anxiety Depression    GI/Hepatic Neg liver ROS, hiatal hernia, GERD-  ,  Endo/Other  diabetes, Type 2  Renal/GU negative Renal ROS     Musculoskeletal   Abdominal   Peds  Hematology negative hematology ROS (+)   Anesthesia Other Findings   Reproductive/Obstetrics                           Anesthesia Physical Anesthesia Plan  ASA: III  Anesthesia Plan: General   Post-op Pain Management:    Induction: Intravenous  Airway Management Planned: Oral ETT  Additional Equipment: Arterial line  Intra-op Plan:   Post-operative Plan: Extubation in OR  Informed Consent: I have reviewed the patients History and Physical, chart, labs and discussed the procedure including the risks, benefits and alternatives for the proposed anesthesia with the patient or authorized representative who has indicated his/her understanding and acceptance.   Dental advisory given  Plan Discussed with: CRNA  Anesthesia Plan Comments:        Anesthesia Quick Evaluation

## 2014-10-31 ENCOUNTER — Inpatient Hospital Stay (HOSPITAL_COMMUNITY): Payer: Medicare Other | Admitting: Anesthesiology

## 2014-10-31 ENCOUNTER — Encounter (HOSPITAL_COMMUNITY)
Admission: RE | Disposition: A | Discharge status: Home / Self Care | Payer: Self-pay | Source: Ambulatory Visit | Attending: Surgery

## 2014-10-31 ENCOUNTER — Inpatient Hospital Stay (HOSPITAL_COMMUNITY)
Admission: RE | Admit: 2014-10-31 | Discharge: 2014-11-01 | DRG: 039 | Disposition: A | Discharge status: Home / Self Care | Payer: Medicare Other | Source: Ambulatory Visit | Attending: Surgery | Admitting: Surgery

## 2014-10-31 ENCOUNTER — Encounter (HOSPITAL_COMMUNITY): Payer: Self-pay

## 2014-10-31 DIAGNOSIS — I10 Essential (primary) hypertension: Secondary | ICD-10-CM | POA: Diagnosis not present

## 2014-10-31 DIAGNOSIS — I447 Left bundle-branch block, unspecified: Secondary | ICD-10-CM | POA: Diagnosis present

## 2014-10-31 DIAGNOSIS — Z86711 Personal history of pulmonary embolism: Secondary | ICD-10-CM | POA: Diagnosis not present

## 2014-10-31 DIAGNOSIS — J45909 Unspecified asthma, uncomplicated: Secondary | ICD-10-CM | POA: Diagnosis present

## 2014-10-31 DIAGNOSIS — Z87891 Personal history of nicotine dependence: Secondary | ICD-10-CM

## 2014-10-31 DIAGNOSIS — Z950 Presence of cardiac pacemaker: Secondary | ICD-10-CM

## 2014-10-31 DIAGNOSIS — Z96651 Presence of right artificial knee joint: Secondary | ICD-10-CM | POA: Diagnosis present

## 2014-10-31 DIAGNOSIS — F329 Major depressive disorder, single episode, unspecified: Secondary | ICD-10-CM | POA: Diagnosis present

## 2014-10-31 DIAGNOSIS — I4891 Unspecified atrial fibrillation: Secondary | ICD-10-CM | POA: Diagnosis present

## 2014-10-31 DIAGNOSIS — I6529 Occlusion and stenosis of unspecified carotid artery: Secondary | ICD-10-CM | POA: Diagnosis present

## 2014-10-31 DIAGNOSIS — G4733 Obstructive sleep apnea (adult) (pediatric): Secondary | ICD-10-CM | POA: Diagnosis present

## 2014-10-31 DIAGNOSIS — E119 Type 2 diabetes mellitus without complications: Secondary | ICD-10-CM | POA: Diagnosis not present

## 2014-10-31 DIAGNOSIS — E780 Pure hypercholesterolemia: Secondary | ICD-10-CM | POA: Diagnosis present

## 2014-10-31 DIAGNOSIS — F411 Generalized anxiety disorder: Secondary | ICD-10-CM | POA: Diagnosis present

## 2014-10-31 DIAGNOSIS — Z7982 Long term (current) use of aspirin: Secondary | ICD-10-CM

## 2014-10-31 DIAGNOSIS — K219 Gastro-esophageal reflux disease without esophagitis: Secondary | ICD-10-CM | POA: Diagnosis present

## 2014-10-31 DIAGNOSIS — Z794 Long term (current) use of insulin: Secondary | ICD-10-CM | POA: Diagnosis not present

## 2014-10-31 DIAGNOSIS — Z8673 Personal history of transient ischemic attack (TIA), and cerebral infarction without residual deficits: Secondary | ICD-10-CM

## 2014-10-31 DIAGNOSIS — Z79899 Other long term (current) drug therapy: Secondary | ICD-10-CM | POA: Diagnosis not present

## 2014-10-31 DIAGNOSIS — I6522 Occlusion and stenosis of left carotid artery: Secondary | ICD-10-CM

## 2014-10-31 HISTORY — PX: ENDARTERECTOMY: SHX5162

## 2014-10-31 LAB — CBC
HCT: 36.2 % (ref 36.0–46.0)
Hemoglobin: 12.3 g/dL (ref 12.0–15.0)
MCH: 28.6 pg (ref 26.0–34.0)
MCHC: 34 g/dL (ref 30.0–36.0)
MCV: 84.2 fL (ref 78.0–100.0)
PLATELETS: 175 10*3/uL (ref 150–400)
RBC: 4.3 MIL/uL (ref 3.87–5.11)
RDW: 15 % (ref 11.5–15.5)
WBC: 10 10*3/uL (ref 4.0–10.5)

## 2014-10-31 LAB — GLUCOSE, CAPILLARY
Glucose-Capillary: 100 mg/dL — ABNORMAL HIGH (ref 65–99)
Glucose-Capillary: 177 mg/dL — ABNORMAL HIGH (ref 65–99)
Glucose-Capillary: 159 mg/dL — ABNORMAL HIGH (ref 65–99)
Glucose-Capillary: 232 mg/dL — ABNORMAL HIGH (ref 65–99)
Glucose-Capillary: 223 mg/dL — ABNORMAL HIGH (ref 65–99)

## 2014-10-31 LAB — POCT I-STAT GLUCOSE
Glucose, Bld: 197 mg/dL — ABNORMAL HIGH (ref 65–99)
Operator id: 190282

## 2014-10-31 LAB — CREATININE, SERUM: Creatinine, Ser: 0.9 mg/dL (ref 0.44–1.00)

## 2014-10-31 SURGERY — ENDARTERECTOMY, CAROTID
Anesthesia: General | Site: Neck | Laterality: Left

## 2014-10-31 MED ORDER — SUGAMMADEX SODIUM 200 MG/2ML IV SOLN
INTRAVENOUS | Status: DC | PRN
  Administered 2014-10-31: 136 mg via INTRAVENOUS

## 2014-10-31 MED ORDER — METOPROLOL TARTRATE 1 MG/ML IV SOLN: 2.0000 mg | INTRAVENOUS | Status: DC | PRN

## 2014-10-31 MED ORDER — LIDOCAINE HCL (PF) 1 % IJ SOLN
INTRAMUSCULAR | Status: AC
  Filled 2014-10-31: qty 30

## 2014-10-31 MED ORDER — 0.9 % SODIUM CHLORIDE (POUR BTL) OPTIME
TOPICAL | Status: DC | PRN
  Administered 2014-10-31: 2000 mL

## 2014-10-31 MED ORDER — CYCLOSPORINE 0.05 % OP EMUL
1.0000 [drp] | Freq: Two times a day (BID) | OPHTHALMIC | Status: DC
  Administered 2014-10-31: 1 [drp] via OPHTHALMIC
  Filled 2014-10-31 (×3): qty 1

## 2014-10-31 MED ORDER — ROCURONIUM BROMIDE 100 MG/10ML IV SOLN
INTRAVENOUS | Status: DC | PRN
  Administered 2014-10-31: 40 mg via INTRAVENOUS

## 2014-10-31 MED ORDER — DOCUSATE SODIUM 100 MG PO CAPS: 100.0000 mg | ORAL_CAPSULE | Freq: Every day | ORAL | Status: DC

## 2014-10-31 MED ORDER — FENTANYL CITRATE (PF) 250 MCG/5ML IJ SOLN
INTRAMUSCULAR | Status: AC
  Filled 2014-10-31: qty 5

## 2014-10-31 MED ORDER — INSULIN DETEMIR 100 UNIT/ML ~~LOC~~ SOLN
70.0000 [IU] | Freq: Two times a day (BID) | SUBCUTANEOUS | Status: DC
  Administered 2014-10-31: 70 [IU] via SUBCUTANEOUS
  Filled 2014-10-31 (×4): qty 0.7

## 2014-10-31 MED ORDER — GUAIFENESIN-DM 100-10 MG/5ML PO SYRP: 15.0000 mL | ORAL_SOLUTION | ORAL | Status: DC | PRN

## 2014-10-31 MED ORDER — DULOXETINE HCL 60 MG PO CPEP
90.0000 mg | ORAL_CAPSULE | Freq: Every day | ORAL | Status: DC
  Filled 2014-10-31 (×2): qty 1

## 2014-10-31 MED ORDER — LABETALOL HCL 5 MG/ML IV SOLN
INTRAVENOUS | Status: DC | PRN
  Administered 2014-10-31: 5 mg via INTRAVENOUS

## 2014-10-31 MED ORDER — LABETALOL HCL 5 MG/ML IV SOLN: 10.0000 mg | INTRAVENOUS | Status: DC | PRN

## 2014-10-31 MED ORDER — ARTIFICIAL TEARS OP OINT
TOPICAL_OINTMENT | OPHTHALMIC | Status: DC | PRN
  Administered 2014-10-31: 1 via OPHTHALMIC

## 2014-10-31 MED ORDER — OXYCODONE-ACETAMINOPHEN 5-325 MG PO TABS: 1.0000 | ORAL_TABLET | Freq: Four times a day (QID) | ORAL | Status: DC | PRN

## 2014-10-31 MED ORDER — LACTATED RINGERS IV SOLN
INTRAVENOUS | Status: DC | PRN
  Administered 2014-10-31 (×2): via INTRAVENOUS

## 2014-10-31 MED ORDER — PROTAMINE SULFATE 10 MG/ML IV SOLN
INTRAVENOUS | Status: DC | PRN
  Administered 2014-10-31: 20 mg via INTRAVENOUS
  Administered 2014-10-31 (×3): 10 mg via INTRAVENOUS

## 2014-10-31 MED ORDER — EPINEPHRINE 0.3 MG/0.3ML IJ SOAJ: 0.3000 mg | INTRAMUSCULAR | Status: DC | PRN

## 2014-10-31 MED ORDER — POLYETHYLENE GLYCOL 3350 17 G PO PACK
17.0000 g | PACK | Freq: Every day | ORAL | Status: DC | PRN
  Filled 2014-10-31: qty 1

## 2014-10-31 MED ORDER — ONDANSETRON HCL 4 MG/2ML IJ SOLN: 4.0000 mg | Freq: Four times a day (QID) | INTRAMUSCULAR | Status: DC | PRN

## 2014-10-31 MED ORDER — ALUM & MAG HYDROXIDE-SIMETH 200-200-20 MG/5ML PO SUSP: 15.0000 mL | ORAL | Status: DC | PRN

## 2014-10-31 MED ORDER — OXYCODONE-ACETAMINOPHEN 5-325 MG PO TABS
1.0000 | ORAL_TABLET | ORAL | Status: DC | PRN
  Administered 2014-10-31: 1 via ORAL
  Filled 2014-10-31: qty 1

## 2014-10-31 MED ORDER — POTASSIUM CHLORIDE CRYS ER 20 MEQ PO TBCR: 20.0000 meq | EXTENDED_RELEASE_TABLET | Freq: Every day | ORAL | Status: DC | PRN

## 2014-10-31 MED ORDER — MAGNESIUM SULFATE 2 GM/50ML IV SOLN: 2.0000 g | Freq: Every day | INTRAVENOUS | Status: DC | PRN

## 2014-10-31 MED ORDER — PANTOPRAZOLE SODIUM 40 MG PO TBEC: 80.0000 mg | DELAYED_RELEASE_TABLET | Freq: Every day | ORAL | Status: DC

## 2014-10-31 MED ORDER — OXYCODONE HCL 5 MG/5ML PO SOLN: 5.0000 mg | Freq: Once | ORAL | Status: DC | PRN

## 2014-10-31 MED ORDER — PROPOFOL 10 MG/ML IV BOLUS
INTRAVENOUS | Status: DC | PRN
  Administered 2014-10-31: 150 mg via INTRAVENOUS

## 2014-10-31 MED ORDER — HYDRALAZINE HCL 20 MG/ML IJ SOLN: 5.0000 mg | INTRAMUSCULAR | Status: DC | PRN

## 2014-10-31 MED ORDER — ATORVASTATIN CALCIUM 20 MG PO TABS
20.0000 mg | ORAL_TABLET | Freq: Every day | ORAL | Status: DC
  Administered 2014-10-31: 20 mg via ORAL
  Filled 2014-10-31 (×2): qty 1

## 2014-10-31 MED ORDER — HEPARIN SODIUM (PORCINE) 1000 UNIT/ML IJ SOLN
INTRAMUSCULAR | Status: DC | PRN
  Administered 2014-10-31: 7000 [IU] via INTRAVENOUS

## 2014-10-31 MED ORDER — LIDOCAINE HCL (CARDIAC) 20 MG/ML IV SOLN
INTRAVENOUS | Status: DC | PRN
  Administered 2014-10-31: 60 mg via INTRAVENOUS

## 2014-10-31 MED ORDER — PHENYLEPHRINE HCL 10 MG/ML IJ SOLN
10.0000 mg | INTRAVENOUS | Status: DC | PRN
  Administered 2014-10-31: 50 ug/min via INTRAVENOUS

## 2014-10-31 MED ORDER — ASPIRIN EC 81 MG PO TBEC
81.0000 mg | DELAYED_RELEASE_TABLET | Freq: Every day | ORAL | Status: DC
  Filled 2014-10-31: qty 1

## 2014-10-31 MED ORDER — MORPHINE SULFATE (PF) 2 MG/ML IV SOLN
INTRAVENOUS | Status: AC
  Administered 2014-10-31: 2 mg via INTRAMUSCULAR
  Filled 2014-10-31: qty 1

## 2014-10-31 MED ORDER — HYDROMORPHONE HCL 1 MG/ML IJ SOLN
0.2500 mg | INTRAMUSCULAR | Status: DC | PRN
  Administered 2014-10-31 (×2): 0.5 mg via INTRAVENOUS
  Administered 2014-10-31 (×2): 0.25 mg via INTRAVENOUS

## 2014-10-31 MED ORDER — HYDROMORPHONE HCL 1 MG/ML IJ SOLN
INTRAMUSCULAR | Status: AC
  Filled 2014-10-31: qty 1

## 2014-10-31 MED ORDER — HEMOSTATIC AGENTS (NO CHARGE) OPTIME
TOPICAL | Status: DC | PRN
  Administered 2014-10-31: 1 via TOPICAL

## 2014-10-31 MED ORDER — SODIUM CHLORIDE 0.9 % IV SOLN
500.0000 mL | Freq: Once | INTRAVENOUS | Status: DC | PRN
Start: 2014-10-31 — End: 2014-11-01

## 2014-10-31 MED ORDER — SUGAMMADEX SODIUM 200 MG/2ML IV SOLN
INTRAVENOUS | Status: AC
  Filled 2014-10-31: qty 2

## 2014-10-31 MED ORDER — POTASSIUM CHLORIDE IN NACL 20-0.9 MEQ/L-% IV SOLN
INTRAVENOUS | Status: DC
  Administered 2014-10-31: 14:00:00 via INTRAVENOUS
  Filled 2014-10-31 (×2): qty 1000

## 2014-10-31 MED ORDER — FENTANYL CITRATE (PF) 100 MCG/2ML IJ SOLN
INTRAMUSCULAR | Status: DC | PRN
  Administered 2014-10-31: 50 ug via INTRAVENOUS
  Administered 2014-10-31: 100 ug via INTRAVENOUS
  Administered 2014-10-31: 50 ug via INTRAVENOUS

## 2014-10-31 MED ORDER — HYDROCHLOROTHIAZIDE 25 MG PO TABS
12.5000 mg | ORAL_TABLET | Freq: Every day | ORAL | Status: DC
  Filled 2014-10-31: qty 0.5

## 2014-10-31 MED ORDER — MIDAZOLAM HCL 2 MG/2ML IJ SOLN
INTRAMUSCULAR | Status: AC
  Filled 2014-10-31: qty 4

## 2014-10-31 MED ORDER — ACETAMINOPHEN 650 MG RE SUPP: 325.0000 mg | RECTAL | Status: DC | PRN

## 2014-10-31 MED ORDER — POLYETHYLENE GLYCOL 3350 17 GM/SCOOP PO POWD
17.0000 g | Freq: Every day | ORAL | Status: DC | PRN
  Filled 2014-10-31: qty 255

## 2014-10-31 MED ORDER — ACETAMINOPHEN 325 MG PO TABS: 325.0000 mg | ORAL_TABLET | ORAL | Status: DC | PRN

## 2014-10-31 MED ORDER — ENOXAPARIN SODIUM 40 MG/0.4ML ~~LOC~~ SOLN
40.0000 mg | SUBCUTANEOUS | Status: DC
  Filled 2014-10-31: qty 0.4

## 2014-10-31 MED ORDER — OXYCODONE HCL 5 MG PO TABS: 5.0000 mg | ORAL_TABLET | Freq: Once | ORAL | Status: DC | PRN

## 2014-10-31 MED ORDER — MORPHINE SULFATE (PF) 2 MG/ML IV SOLN
2.0000 mg | INTRAVENOUS | Status: DC | PRN
  Administered 2014-10-31 – 2014-11-01 (×2): 2 mg via INTRAVENOUS
  Filled 2014-10-31 (×2): qty 1

## 2014-10-31 MED ORDER — NEBIVOLOL HCL 10 MG PO TABS
10.0000 mg | ORAL_TABLET | Freq: Every day | ORAL | Status: DC
Start: 2014-10-31 — End: 2014-11-01
  Administered 2014-10-31: 10 mg via ORAL
  Filled 2014-10-31 (×2): qty 1

## 2014-10-31 MED ORDER — INSULIN ASPART 100 UNIT/ML FLEXPEN: 12.0000 [IU] | PEN_INJECTOR | Freq: Three times a day (TID) | SUBCUTANEOUS | Status: DC

## 2014-10-31 MED ORDER — ONDANSETRON HCL 4 MG/2ML IJ SOLN
INTRAMUSCULAR | Status: DC | PRN
  Administered 2014-10-31: 4 mg via INTRAVENOUS

## 2014-10-31 MED ORDER — MECLIZINE HCL 25 MG PO TABS: 25.0000 mg | ORAL_TABLET | Freq: Three times a day (TID) | ORAL | Status: DC | PRN

## 2014-10-31 MED ORDER — LORATADINE 10 MG PO TABS
10.0000 mg | ORAL_TABLET | Freq: Every day | ORAL | Status: DC
  Filled 2014-10-31: qty 1

## 2014-10-31 MED ORDER — PROMETHAZINE HCL 25 MG/ML IJ SOLN: 6.2500 mg | INTRAMUSCULAR | Status: DC | PRN

## 2014-10-31 MED ORDER — SODIUM CHLORIDE 0.9 % IR SOLN
Status: DC | PRN
  Administered 2014-10-31: 500 mL

## 2014-10-31 MED ORDER — TRAZODONE 25 MG HALF TABLET
25.0000 mg | ORAL_TABLET | Freq: Every day | ORAL | Status: DC
  Administered 2014-10-31: 25 mg via ORAL
  Filled 2014-10-31 (×2): qty 1

## 2014-10-31 MED ORDER — DILTIAZEM HCL 60 MG PO TABS: 120.0000 mg | ORAL_TABLET | Freq: Every day | ORAL | Status: DC

## 2014-10-31 MED ORDER — VANCOMYCIN HCL IN DEXTROSE 1-5 GM/200ML-% IV SOLN
1000.0000 mg | Freq: Two times a day (BID) | INTRAVENOUS | Status: DC
  Filled 2014-10-31: qty 200

## 2014-10-31 MED ORDER — PROPOFOL 10 MG/ML IV BOLUS
INTRAVENOUS | Status: AC
  Filled 2014-10-31: qty 20

## 2014-10-31 MED ORDER — VANCOMYCIN HCL IN DEXTROSE 1-5 GM/200ML-% IV SOLN
1000.0000 mg | Freq: Once | INTRAVENOUS | Status: AC
  Administered 2014-11-01: 1000 mg via INTRAVENOUS
  Filled 2014-10-31: qty 200

## 2014-10-31 MED ORDER — PHENOL 1.4 % MT LIQD
1.0000 | OROMUCOSAL | Status: DC | PRN
  Administered 2014-10-31: 1 via OROMUCOSAL
  Filled 2014-10-31: qty 177

## 2014-10-31 SURGICAL SUPPLY — 47 items
CANISTER SUCTION 2500CC (MISCELLANEOUS) ×3 IMPLANT
CATH ROBINSON RED A/P 18FR (CATHETERS) ×3 IMPLANT
CATH SUCT 10FR WHISTLE TIP (CATHETERS) ×3 IMPLANT
CLIP TI MEDIUM 6 (CLIP) ×3 IMPLANT
CLIP TI WIDE RED SMALL 6 (CLIP) ×3 IMPLANT
CRADLE DONUT ADULT HEAD (MISCELLANEOUS) ×3 IMPLANT
DRAIN CHANNEL 15F RND FF W/TCR (WOUND CARE) IMPLANT
ELECT REM PT RETURN 9FT ADLT (ELECTROSURGICAL) ×3
ELECTRODE REM PT RTRN 9FT ADLT (ELECTROSURGICAL) ×1 IMPLANT
EVACUATOR SILICONE 100CC (DRAIN) IMPLANT
GAUZE SPONGE 4X4 12PLY STRL (GAUZE/BANDAGES/DRESSINGS) ×3 IMPLANT
GLOVE BIO SURGEON STRL SZ 6.5 (GLOVE) ×2 IMPLANT
GLOVE BIO SURGEONS STRL SZ 6.5 (GLOVE) ×1
GLOVE BIOGEL PI IND STRL 6.5 (GLOVE) ×1 IMPLANT
GLOVE BIOGEL PI IND STRL 7.5 (GLOVE) ×1 IMPLANT
GLOVE BIOGEL PI INDICATOR 6.5 (GLOVE) ×2
GLOVE BIOGEL PI INDICATOR 7.5 (GLOVE) ×2
GLOVE ECLIPSE 7.0 STRL STRAW (GLOVE) ×3 IMPLANT
GLOVE SURG SS PI 7.5 STRL IVOR (GLOVE) ×3 IMPLANT
GOWN STRL REUS W/ TWL LRG LVL3 (GOWN DISPOSABLE) ×1 IMPLANT
GOWN STRL REUS W/ TWL XL LVL3 (GOWN DISPOSABLE) ×2 IMPLANT
GOWN STRL REUS W/TWL LRG LVL3 (GOWN DISPOSABLE) ×2
GOWN STRL REUS W/TWL XL LVL3 (GOWN DISPOSABLE) ×4
HEMOSTAT SNOW SURGICEL 2X4 (HEMOSTASIS) ×3 IMPLANT
INSERT FOGARTY SM (MISCELLANEOUS) IMPLANT
KIT BASIN OR (CUSTOM PROCEDURE TRAY) ×3 IMPLANT
KIT ROOM TURNOVER OR (KITS) ×3 IMPLANT
LIQUID BAND (GAUZE/BANDAGES/DRESSINGS) ×3 IMPLANT
NEEDLE HYPO 25GX1X1/2 BEV (NEEDLE) IMPLANT
NS IRRIG 1000ML POUR BTL (IV SOLUTION) ×9 IMPLANT
PACK CAROTID (CUSTOM PROCEDURE TRAY) ×3 IMPLANT
PAD ARMBOARD 7.5X6 YLW CONV (MISCELLANEOUS) ×6 IMPLANT
PATCH VASC XENOSURE 1CMX6CM (Vascular Products) ×2 IMPLANT
PATCH VASC XENOSURE 1X6 (Vascular Products) ×1 IMPLANT
SHUNT CAROTID BYPASS 10 (VASCULAR PRODUCTS) IMPLANT
SHUNT CAROTID BYPASS 12FRX15.5 (VASCULAR PRODUCTS) IMPLANT
SPONGE INTESTINAL PEANUT (DISPOSABLE) IMPLANT
SUT ETHILON 3 0 PS 1 (SUTURE) IMPLANT
SUT PROLENE 6 0 BV (SUTURE) ×9 IMPLANT
SUT PROLENE 7 0 BV 1 (SUTURE) IMPLANT
SUT SILK 3 0 TIES 17X18 (SUTURE)
SUT SILK 3-0 18XBRD TIE BLK (SUTURE) IMPLANT
SUT VIC AB 3-0 SH 27 (SUTURE) ×6
SUT VIC AB 3-0 SH 27X BRD (SUTURE) ×3 IMPLANT
SUT VICRYL 4-0 PS2 18IN ABS (SUTURE) ×3 IMPLANT
SYR CONTROL 10ML LL (SYRINGE) IMPLANT
WATER STERILE IRR 1000ML POUR (IV SOLUTION) IMPLANT

## 2014-10-31 NOTE — Interval H&P Note (Signed)
History and Physical Interval Note:  10/31/2014 7:17 AM  [name]  has presented today for surgery, with the diagnosis of Left carotid artery stenosis I65.22  The various methods of treatment have been discussed with the patient and family. After consideration of risks, benefits and other options for treatment, the patient has consented to  Procedure(s): ENDARTERECTOMY CAROTID (Left) as a surgical intervention .  The patient's history has been reviewed, patient examined, no change in status, stable for surgery.  I have reviewed the patient's chart and labs.  Questions were answered to the patient's satisfaction.     Else Habermann, Wells  No interval changes  Wells Akim Watkinson

## 2014-10-31 NOTE — Progress Notes (Signed)
UR COMPLETED  

## 2014-10-31 NOTE — Anesthesia Procedure Notes (Signed)
Procedure Name: Intubation Date/Time: 10/31/2014 7:46 AM Performed by: Suzy Bouchard Pre-anesthesia Checklist: Patient identified, Timeout performed, Emergency Drugs available, Suction available and Patient being monitored Patient Re-evaluated:Patient Re-evaluated prior to inductionOxygen Delivery Method: Circle system utilized Preoxygenation: Pre-oxygenation with 100% oxygen Intubation Type: IV induction Ventilation: Mask ventilation without difficulty Laryngoscope Size: Miller, 2 and Glidescope Grade View: Grade III Tube type: Oral Tube size: 7.0 mm Number of attempts: 2 Airway Equipment and Method: Stylet and LTA kit utilized Placement Confirmation: ETT inserted through vocal cords under direct vision,  breath sounds checked- equal and bilateral and positive ETCO2 Secured at: 21 cm Tube secured with: Tape Dental Injury: Teeth and Oropharynx as per pre-operative assessment  Comments: DL x1 with Miller 2.  Able to see epiglottis but due to stiffness and small mouth unable to easily lift and visualize cords.  Attempt #2 with small adult glidescope.  Gr 1 view- LTA and ETT passed easily.

## 2014-10-31 NOTE — Progress Notes (Signed)
Spoke with [employer] Rep.  She stated she will try to get here by 7am in order to reprogram/interrogate pacemaker. Patient's surgery is scheduled for 730 and she is aware.

## 2014-10-31 NOTE — Anesthesia Postprocedure Evaluation (Signed)
  Anesthesia Post-op Note  Patient: [name]  Procedure(s) Performed: Procedure(s): LEFT CAROTID ENDARTERECTOMY WITH BOVINE PERICARDIAL PATCH ANGIOPLASTY (Left)  Patient Location: PACU  Anesthesia Type:General  Level of Consciousness: awake, alert  and oriented  Airway and Oxygen Therapy: Patient Spontanous Breathing  Post-op Pain: none  Post-op Assessment: Post-op Vital signs reviewed LLE Motor Response: Purposeful movement   RLE Motor Response: Purposeful movement        Post-op Vital Signs: Reviewed  Last Vitals:  Filed Vitals:   10/31/14 1130  BP: 106/86  Pulse: 60  Temp:   Resp: 12    Complications: No apparent anesthesia complications

## 2014-10-31 NOTE — Progress Notes (Addendum)
     No complaints other than tooth pain that she had prior to surgery.  Blurred vision in the left eye> right  Tongue deviation to the right baseline before surgery Left asymmetric smile Grip 5/5 Incision C/D/I   S/P left CEA Stable  COLLINS, EMMA MAUREEN PA-C

## 2014-10-31 NOTE — Progress Notes (Signed)
10/31/14  Pharmacy-  Antibiotic dosage adjustment for renal fxn 1315  Cr 0.84, CrCl 69ml/min  A/P:  69yo female s/p left carotid Endarterectomy with bovine pericardial patch angioplasty, to receive Vancomycin  IV q12 x 2 doses for post-op prophylaxis.  She received Vancomycin  pre-op at 0730.  Based on renal fxn, pt will require dosage adjustment of post-op dosing.  Change Vancomycin to  IV x 1 at 1AM.  Marisue Humble, PharmD Clinical Pharmacist [facility]

## 2014-10-31 NOTE — Op Note (Signed)
Patient name: [name] MRN: [id number] DOB: Oct 07, 1945 Sex: female  10/31/2014 Pre-operative Diagnosis: asymptomatic   left carotid stenosis Post-operative diagnosis:  Same Surgeon:  Durene Cal Assistants:  Karsten Ro Procedure:    left carotid Endarterectomy with bovine pericardial patch angioplasty Anesthesia:  General Blood Loss:  See anesthesia record Specimens:  Carotid Plaque to pathology  Findings:  85 %stenosis; Thrombus:  None  Indications:  The patient has been followed with serial carotid imaging for high-grade stenosis.  This is recently progressed to greater than 80%.  She remains asymptomatic and is here today for endarterectomy.  Procedure:  The patient was identified in the holding area and taken to [facility] OR ROOM 12  The patient was then placed supine on the table.   General endotrachial anesthesia was administered.  The patient was prepped and draped in the usual sterile fashion.  A time out was called and antibiotics were administered.  The incision was made along the anterior border of the left sternocleidomastoid muscle.  Cautery was used to dissect through the subcutaneous tissue.  The platysma muscle was divided with cautery.  The internal jugular vein was exposed along its anterior medial border.  The common facial vein was exposed and then divided between 2-0 silk ties and metal clips.  The common carotid artery was then circumferentially exposed and encircled with an umbilical tape.  The vagus nerve was identified and protected.  Next sharp dissection was used to expose the external carotid artery and the superior thyroid artery.  The were encircled with a blue vessel loop and a 2-0 silk tie respectively.  Finally, the internal carotid was carefully dissected free.  An umbilical tape was placed around the internal carotid artery distal to the diseased segment.  The hypoglossal nerve was visualized throughout and protected.  The patient was given systemic  heparinization.  A bovine carotid patch was selected and prepared on the back table.  A 10 french shunt was also prepared.  After blood pressure readings were appropriate and the heparin had been given time to circulate, the internal carotid artery was occluded with a baby Gregory clamp.  The external and common carotid arteries were then occluded with vascular clamps and the 2-0 tie tightened on the superior thyroid artery.  A #11 blade was used to make an arteriotomy in the common carotid artery.  This was extended with Potts scissors along the anterior and lateral border of the common and internal carotid artery.  Approximately 85% stenosis was identified.  There was no thrombus identified.  The 10 french shunt was not placed, given that there was pulsatile backbleeding.  A kleiner kuntz elevator was used to perform endarterectomy.  An eversion endarterectomy was performed in the external carotid artery.  A good distal endpoint was obtained in the internal carotid artery.  The specimen was removed and sent to pathology.  Heparinized saline was used to irrigate the endarterectomized field.  All potential embolic debris was removed.  Bovine pericardial patch angioplasty was then performed using a running 6-0 Prolene.  The common internal and external carotid arteries were all appropriately flushed. The artery was again irrigated with heparin saline.  The anastomosis was then secured. The clamp was first released on the external carotid artery followed by the common carotid artery approximately 30 seconds later, bloodflow was reestablish through the internal carotid artery.  Next, a hand-held  Doppler was used to evaluate the signals in the common, external, and internal  carotid arteries, all of  which had appropriate signals. I then administered  50 mg protamine. The wound was then irrigated.  After hemostasis was achieved, the carotid sheath was reapproximated with 3-0 Vicryl. The  platysma muscle was  reapproximated with running 3-0 Vicryl. The skin  was closed with 4-0 Vicryl. Dermabond was placed on the skin. The  patient was then successfully extubated. Her neurologic exam was  similar to his preprocedural exam. The patient was then taken to recovery room  in stable condition. There were no complications.     Disposition:  To PACU in stable condition.  Relevant Operative Details:  Normal anatomy.  Isolated focal ulcerated lesion at the carotid bifurcation.  She did have the internal and external carotid artery rotated 180 apart.  No shunt was utilized as she had excellent pulsatile backbleeding.  Juleen China, M.D. [facility] Office: [phone] Pager:  [phone]

## 2014-10-31 NOTE — H&P (View-Only) (Signed)
Patient name: [name] MRN: [id number] DOB: 1945/08/31 Sex: female   Referred by: Dr. Allyson Sabal  Reason for referral:  Chief Complaint  Patient presents with  . New Evaluation    eval carotid stenosis     HISTORY OF PRESENT ILLNESS:  this is a 69 year old female that is referred today for carotid stenosis. The patient has been followed with serial carotid ultrasounds and has now progressed to greater than 80% on the left.  She is asymptomatic.  Specifically, she denies numbness or weakness in either extremity.  She denies slurred speech.  She denies amaurosis fugax.  The patient does report having had a brain bleed in the past.   The patient is a left bundle branch block.  She has a pacemaker in place. The patient has a history of pulmonary embolism following knee surgery. She is not on anti- coagulation.  She is a diabetic.  Her hemoglobin A1c is 8.2.  She is medically managed for hypercholesterolemia with a statin.  She is a former smoker, having quit in 2004.  Past Medical History  Diagnosis Date  . Total knee replacement status   . Disorders of sacrum   . Depressive disorder, not elsewhere classified   . LBBB (left bundle branch block)   . Pain in joint, lower leg   . Unspecified asthma(493.90)   . Cardiac pacemaker [employer]   . Chronotropic incompetence with sinus node dysfunction   . Obstructive sleep apnea (adult) (pediatric)   . Anxiety state, unspecified   . Headache(784.0)   . Unspecified cerebral artery occlusion with cerebral infarction     high-grade left ICA stenosis  . Type II or unspecified type diabetes mellitus without mention of complication, not stated as uncontrolled   . Other voice and resonance disorders   . Bronchitis, not specified as acute or chronic   . Allergic rhinitis, cause unspecified   . Pulmonary embolism   . Myalgia and myositis, unspecified   . GERD (gastroesophageal reflux disease)   . Hypertension   . Atrial fibrillation      Past Surgical History  Procedure Laterality Date  . Total knee arthroplasty  2002    right  . Total knee repair  2010    right  . Vesicovaginal fistula closure w/ tah  1974  . Bilateral salpingoophorectomy  1991  . Cholecystectomy    . Nasal fracture surgery    . Temporomandibular joint arthroplasty  1982  . Esophagogastroduodenoscopy  11/02/2011    Procedure: ESOPHAGOGASTRODUODENOSCOPY (EGD);  Surgeon: Vertell Novak., MD;  Location: [facility];  Service: Endoscopy;  Laterality: N/A;  with xray  . Savory dilation  11/02/2011    Procedure: SAVORY DILATION;  Surgeon: Vertell Novak., MD;  Location: [facility];  Service: Endoscopy;  Laterality: N/A;  . Esophagogastroduodenoscopy N/A 06/26/2013    Procedure: ESOPHAGOGASTRODUODENOSCOPY (EGD);  Surgeon: Vertell Novak., MD;  Location: [facility];  Service: Endoscopy;  Laterality: N/A;  . Balloon dilation N/A 06/26/2013    Procedure: BALLOON DILATION;  Surgeon: Vertell Novak., MD;  Location: [facility];  Service: Endoscopy;  Laterality: N/A;  . Colonoscopy N/A 06/26/2013    Procedure: COLONOSCOPY;  Surgeon: Vertell Novak., MD;  Location: [facility];  Service: Endoscopy;  Laterality: N/A;    Social History   Social History  . Marital Status: Married    Spouse Name: N/A  . Number of Children: N/A  . Years of Education: N/A   Occupational  History  . Not on file.   Social History Main Topics  . Smoking status: Former Smoker -- 0.30 packs/day for 20 years    Types: Cigarettes    Quit date: 09/12/2001  . Smokeless tobacco: Never Used     Comment: pt smoked less than 1/4 ppd  . Alcohol Use: No  . Drug Use: No  . Sexual Activity: Not on file   Other Topics Concern  . Not on file   Social History Narrative    Family History  Problem Relation Age of Onset  . Cancer Father     LUNG  . Alcohol abuse Father   . Diabetes Mother   . Heart disease Mother   . Stroke Mother   . Heart attack Mother    . Hypertension Mother   . Deep vein thrombosis Mother   . Hyperlipidemia Mother   . Varicose Veins Mother   . Peripheral vascular disease Mother     amputation  . Lupus Sister   . Diabetes Sister   . Aneurysm Sister   . Deep vein thrombosis Sister   . Hyperlipidemia Sister   . Hypertension Sister   . Varicose Veins Sister   . Heart disease Sister   . Deep vein thrombosis Sister   . Diabetes Sister   . Hyperlipidemia Sister   . Hypertension Sister   . Varicose Veins Sister     Allergies as of 10/20/2014 - Review Complete 10/20/2014  Allergen Reaction Noted  . Atenolol  09/10/2014  . Cephalexin Swelling 05/16/2007  . Chromic sulfate  09/10/2014  . Codeine Nausea Only 09/10/2014  . Desmopressin  09/10/2014  . Dexilant [dexlansoprazole] Diarrhea 09/10/2014  . Doxycycline Nausea And Vomiting   . Entex    . Farxiga [dapagliflozin]  09/10/2014  . Gabapentin    . Levaquin [levofloxacin] Other (See Comments)   . Lexapro [escitalopram]  09/10/2014  . Lisinopril    . Losartan potassium    . Metformin    . Other  09/10/2014  . Pregabalin    . Rosiglitazone maleate    . Simvastatin    . Sitagliptin phosphate    . Tradjenta [linagliptin] Other (See Comments) 09/10/2014    Current Outpatient Prescriptions on File Prior to Visit  Medication Sig Dispense Refill  . aspirin 81 MG tablet Take 81 mg by mouth daily.      Marland Kitchen atorvastatin (LIPITOR) 20 MG tablet Take 20 mg by mouth daily.      . cycloSPORINE (RESTASIS) 0.05 % ophthalmic emulsion Place 1 drop into both eyes 2 (two) times daily.    Marland Kitchen diltiazem (CARDIZEM) 120 MG tablet Take 120 mg by mouth daily.    . DULoxetine (CYMBALTA) 30 MG capsule Take 90 mg by mouth daily.     . hydrochlorothiazide (HYDRODIURIL) 25 MG tablet Take 12.5 mg by mouth daily.    . nebivolol (BYSTOLIC) 10 MG tablet Take 1 tablet (10 mg total) by mouth daily. 30 tablet 6  . Omega-3 Fatty Acids (FISH OIL) 1200 MG CAPS Take 2 capsules by mouth daily.      Marland Kitchen  omeprazole-sodium bicarbonate (ZEGERID) 40-1100 MG per capsule Take 1 capsule by mouth at bedtime.      . TRAZODONE HCL PO Take 25 mg by mouth at bedtime.    . TUBERCULIN SYR 1CC/25GX5/8" (B-D TB SYRINGE 1CC/25GX5/8") 25G X 5/8" 1 ML MISC Use as directed with allergy injections 100 each 0  . BD PEN NEEDLE NANO U/F 32G X 4 MM MISC  Use as directed for Insulin Injections    . chlorhexidine (PERIDEX) 0.12 % solution 2 (two) times daily. AS DIRECTED ON BOTTLE  1  . EPINEPHrine 0.3 mg/0.3 mL IJ SOAJ injection Inject 0.3 mg into the muscle as needed (FOR ALLERGIES).    . hydrOXYzine (ATARAX/VISTARIL) 10 MG tablet Take 1 tablet (10 mg total) by mouth every 8 (eight) hours as needed for itching. (Patient not taking: Reported on 10/20/2014) 30 tablet 0  . insulin detemir (LEVEMIR) 100 UNIT/ML injection 30 units in the morning and 60 units in the evening; currently in adjusting phase with Endocrinology, not sure of dosing at this time, changes every 2 weeks.Marland KitchenMarland KitchenSliding scale    . loratadine (CLARITIN) 10 MG tablet Take 10 mg by mouth daily.    . polyethylene glycol powder (MIRALAX) powder Take 17 g by mouth as needed for mild constipation.     . potassium chloride SA (K-DUR,KLOR-CON) 20 MEQ tablet Take 1 tablet (20 mEq total) by mouth daily. (Patient not taking: Reported on 10/20/2014) 30 tablet 11  . [DISCONTINUED] amLODipine (NORVASC) 5 MG tablet Take 1 tablet by mouth daily.     No current facility-administered medications on file prior to visit.     REVIEW OF SYSTEMS: Cardiovascular:  Positive for chest pain, chest pressure, palpitations, shortness of breath when lying flat and with exertion, pain in her legs when walking and lying flat. Positive for leg swelling, varicose veins and histor of DVT. Pulmonary:  Positive for productive cough, asthma  and wheezing. Neurologic:  Positive for weakness, paresthesias,  and dizziness. Hematologic: No bleeding problems or clotting disorders. Musculoskeletal: No  joint pain or joint swelling. Gastrointestinal: No blood in stool or hematemesis Genitourinary: No dysuria or hematuria. Psychiatric::  Positive forhistory of major depression. Integumentary: No rashes or ulcers. Constitutional: No fever or chills.  PHYSICAL EXAMINATION:  Filed Vitals:   10/20/14 1017 10/20/14 1025  BP: 149/60 150/53  Pulse: 68 65  Temp: 98.1 F (36.7 C)   TempSrc: Oral   Height:  (1.6 m)   Weight: 148 lb 6.4 oz (67.314 kg)   SpO2: 98%    Body mass index is 26.29 kg/(m^2). General: The patient appears their stated age.   HEENT:  No gross abnormalities Pulmonary: Respirations are non-labored Abdomen: Soft and non-tender.  No pulsatile mass  Musculoskeletal: There are no major deformities.   Neurologic: No focal weakness or paresthesias are detected, Skin: There are no ulcer or rashes noted. Psychiatric: The patient has normal affect. Cardiovascular: There is a regular rate and rhythm without significant murmur appreciated.  Diagnostic Studies:  I have reviewed her carotid Doppler studies which showed greater than 80% left carotid stenosis and 60-79% right carotid stenosis    Assessment:   asymptomatic left carotid stenosis Plan: I discussed our treatment options which include but are not limited to stenting versus endarterectomy versus medical management. After a well-informed discussion, we have decided to proceed with left carotid endarterectomy. I discussed the risks and benefits including the risk of stroke, nerve injury, facial numbness, and cardiopulmonary complications. All her questions were answered.  Her operation has been scheduled for   Friday August 26    V. Charlena Cross, M.D. [facility] [place] Office: [phone] Pager:  [phone]

## 2014-10-31 NOTE — Transfer of Care (Signed)
Immediate Anesthesia Transfer of Care Note  Patient: [name]  Procedure(s) Performed: Procedure(s): LEFT CAROTID ENDARTERECTOMY WITH BOVINE PERICARDIAL PATCH ANGIOPLASTY (Left)  Patient Location: PACU  Anesthesia Type:General  Level of Consciousness: awake, alert  and oriented to place and person.  Speech normal.  Airway & Oxygen Therapy: Patient Spontanous Breathing and Patient connected to nasal cannula oxygen  Post-op Assessment: Report given to RN, Post -op Vital signs reviewed and stable and Patient moving all extremities X 4 , able to stick tongue midline. Post vital signs: Reviewed and stable  Last Vitals:  Filed Vitals:   10/31/14 0610  BP: 140/76  Pulse: 60  Temp: 36.7 C  Resp: 20    Complications: No apparent anesthesia complications

## 2014-11-01 LAB — BASIC METABOLIC PANEL
Anion gap: 10 (ref 5–15)
BUN: 10 mg/dL (ref 6–20)
CO2: 26 mmol/L (ref 22–32)
CREATININE: 0.75 mg/dL (ref 0.44–1.00)
Calcium: 8.8 mg/dL — ABNORMAL LOW (ref 8.9–10.3)
Chloride: 99 mmol/L — ABNORMAL LOW (ref 101–111)
GFR calc Af Amer: >60 mL/min (ref >60)
GLUCOSE: 200 mg/dL — AB (ref 65–99)
POTASSIUM: 4.2 mmol/L (ref 3.5–5.1)
SODIUM: 135 mmol/L (ref 135–145)

## 2014-11-01 LAB — GLUCOSE, CAPILLARY: GLUCOSE-CAPILLARY: 160 mg/dL — AB (ref 65–99)

## 2014-11-01 LAB — CBC
HCT: 39.8 % (ref 36.0–46.0)
Hemoglobin: 13.1 g/dL (ref 12.0–15.0)
MCH: 27.9 pg (ref 26.0–34.0)
MCHC: 32.9 g/dL (ref 30.0–36.0)
MCV: 84.7 fL (ref 78.0–100.0)
PLATELETS: 211 10*3/uL (ref 150–400)
RBC: 4.7 MIL/uL (ref 3.87–5.11)
RDW: 14.9 % (ref 11.5–15.5)
WBC: 10.4 10*3/uL (ref 4.0–10.5)

## 2014-11-01 MED ORDER — OXYCODONE HCL 5 MG PO TABS: 5.0000 mg | ORAL_TABLET | Freq: Four times a day (QID) | ORAL | Status: DC | PRN

## 2014-11-01 MED ORDER — INSULIN ASPART 100 UNIT/ML ~~LOC~~ SOLN: 0.0000 [IU] | Freq: Three times a day (TID) | SUBCUTANEOUS | Status: DC

## 2014-11-01 MED ORDER — OXYCODONE HCL 5 MG PO TABS: 5.0000 mg | ORAL_TABLET | ORAL | Status: DC | PRN

## 2014-11-01 MED ORDER — TRAMADOL HCL 50 MG PO TABS: 50.0000 mg | ORAL_TABLET | Freq: Four times a day (QID) | ORAL | Status: DC | PRN

## 2014-11-01 NOTE — Progress Notes (Addendum)
[facility] [place]  Subjective  - On/Off HA, tooth pain prior to surgery.  She want s to go home.   Objective 172/64 78 98.3 F (36.8 C) (Oral) 13 94%  Intake/Output Summary (Last 24 hours) at 11/01/14 0842 Last data filed at 11/01/14 0525  Gross per 24 hour  Intake 3268.33 ml  Output     50 ml  Net 3218.33 ml   Tongue deviation to the right baseline before surgery Left asymmetric smile Grip 5/5 Incision C/D/I   Assessment/Planning: POD # Left CEA  HA on  And off po pain medication helps as well as the tooth pain. She has ambulated, voided and taking PO's well She will be discharged home and f/u in 2 weeks  Thomasena Edis [facility] 11/01/2014 8:42 AM --  Laboratory Lab Results:  Recent Labs  10/31/14 1356 11/01/14 0545  WBC 10.0 10.4  HGB 12.3 13.1  HCT 36.2 39.8  PLT 175 211   BMET  Recent Labs  10/31/14 0852 10/31/14 1356 11/01/14 0545  NA  --   --  135  K  --   --  4.2  CL  --   --  99*  CO2  --   --  26  GLUCOSE 197*  --  200*  BUN  --   --  10  CREATININE  --  0.90 0.75  CALCIUM  --   --  8.8*    COAG Lab Results  Component Value Date   INR 1.05 10/28/2014   INR 1.66* 10/30/2010   INR 1.60* 10/29/2010   No results found for: PTT

## 2014-11-03 ENCOUNTER — Encounter (HOSPITAL_COMMUNITY): Payer: Self-pay | Admitting: Surgery

## 2014-11-04 ENCOUNTER — Telehealth: Payer: Self-pay | Admitting: Surgery

## 2014-11-04 NOTE — Telephone Encounter (Addendum)
-----   Message from Sharee Pimple, RN sent at 10/31/2014  3:00 PM EDT ----- Regarding: Schedule   ----- Message -----    From: Raymond Gurney, PA-C    Sent: 10/31/2014   9:11 AM      To: Vvs Charge Pool  S/p left CEA 10/31/14  F/u with [facility] in 2 weeks  Thanks Selena Batten   notified patient of post op appt. with dr. Myra Gianotti on 11-17-14 at [facility]

## 2014-11-05 NOTE — Discharge Summary (Signed)
[facility] Discharge Summary   Patient ID:  [name] MRN: [id number] DOB/AGE: [birth year] 71 y.o.  Admit date: 10/31/2014 Discharge date: 11/01/2014 Date of Surgery: 10/31/2014 Surgeon: Surgeon(s): Nada Libman, MD  Admission Diagnosis: Left carotid artery stenosis I65.22  Discharge Diagnoses:  Left carotid artery stenosis I65.22  Secondary Diagnoses: Past Medical History  Diagnosis Date  . Total knee replacement status   . Disorders of sacrum   . Depressive disorder, not elsewhere classified   . Pain in joint, lower leg     bilateral  . Unspecified asthma(493.90)   . Cardiac pacemaker medtronic   . Chronotropic incompetence with sinus node dysfunction   . Obstructive sleep apnea (adult) (pediatric)   . Anxiety state, unspecified   . Other voice and resonance disorders   . Bronchitis, not specified as acute or chronic   . Allergic rhinitis, cause unspecified   . Pulmonary embolism   . Hypertension   . Atrial fibrillation   . Arthritis   . Unspecified cerebral artery occlusion with cerebral infarction     high-grade left ICA stenosis  . LBBB (left bundle branch block)   . Type II or unspecified type diabetes mellitus without mention of complication, not stated as uncontrolled     Type II  . GERD (gastroesophageal reflux disease)   . Headache(784.0)     history of  . Myalgia and myositis, unspecified   . Vertigo     Meclizine (Antivert)  . Presence of permanent cardiac pacemaker   . Heart murmur   . Rheumatic fever in pediatric patient   . History of hiatal hernia     Procedure(s): LEFT CAROTID ENDARTERECTOMY WITH BOVINE PERICARDIAL PATCH ANGIOPLASTY  Discharged Condition: good  HPI: this is a 69 year old female that is referred today for carotid stenosis. The patient has been followed with serial carotid ultrasounds and has now progressed to greater than 80% on the left. She is asymptomatic. Specifically, she denies numbness or  weakness in either extremity. She denies slurred speech. She denies amaurosis fugax. The patient does report having had a brain bleed in the past.   The patient is a left bundle branch block. She has a pacemaker in place. The patient has a history of pulmonary embolism following knee surgery. She is not on anti- coagulation. She is a diabetic. Her hemoglobin A1c is 8.2. She is medically managed for hypercholesterolemia with a statin. She is a former smoker, having quit in 2004.   Hospital Course:  [name] is a 69 y.o. female is S/P Procedure(s): LEFT CAROTID ENDARTERECTOMY WITH BOVINE PERICARDIAL PATCH ANGIOPLASTY POD# 1 Tongue deviation to the right baseline before surgery Left asymmetric smile Grip 5/5 Incision C/D/I   HA on And off po pain medication helps as well as the tooth pain. She has ambulated, voided and taking PO's well She will be discharged home and f/u in 2 weeks  Significant Diagnostic Studies: CBC Lab Results  Component Value Date   WBC 10.4 11/01/2014   HGB 13.1 11/01/2014   HCT 39.8 11/01/2014   MCV 84.7 11/01/2014   PLT 211 11/01/2014    BMET    Component Value Date/Time   NA 135 11/01/2014 0545   K 4.2 11/01/2014 0545   CL 99* 11/01/2014 0545   CO2 26 11/01/2014 0545   GLUCOSE 200* 11/01/2014 0545   BUN 10 11/01/2014 0545   CREATININE 0.75 11/01/2014 0545   CALCIUM 8.8* 11/01/2014 0545   GFRNONAA >60 11/01/2014 0545  GFRAA >60 11/01/2014 0545   COAG Lab Results  Component Value Date   INR 1.05 10/28/2014   INR 1.66* 10/30/2010   INR 1.60* 10/29/2010     Disposition:  Discharge to :Home Discharge Instructions    CAROTID [place]: Call MD for difficulty swallowing or speaking; weakness in arms or legs that is a new symtom; severe headache.  If you have increased swelling in the neck and/or  are having difficulty breathing, CALL 911    Complete by:  As directed      Call MD for:  redness, tenderness, or signs of infection (pain,  swelling, bleeding, redness, odor or green/yellow discharge around incision site)    Complete by:  As directed      Call MD for:  severe or increased pain, loss or decreased feeling  in affected limb(s)    Complete by:  As directed      Call MD for:  temperature >100.5    Complete by:  As directed      Discharge patient    Complete by:  As directed   Discharge pt to home     Discharge wound care:    Complete by:  As directed   Wash wound daily with soap and water and pat dry.     Driving Restrictions    Complete by:  As directed   No driving for 2 weeks     Increase activity slowly    Complete by:  As directed   Walk with assistance use walker or cane as needed     Lifting restrictions    Complete by:  As directed   No lifting for 2 weeks     Resume previous diet    Complete by:  As directed             Medication List    STOP taking these medications        diltiazem 120 MG tablet  Commonly known as:  CARDIZEM      TAKE these medications        aspirin 81 MG tablet  Take 81 mg by mouth daily.     atorvastatin 20 MG tablet  Commonly known as:  LIPITOR  Take 20 mg by mouth daily.     BD PEN NEEDLE NANO U/F 32G X 4 MM Misc  Generic drug:  Insulin Pen Needle  Use as directed for Insulin Injections     BYSTOLIC 10 MG tablet  Generic drug:  nebivolol  Take 1 tablet (10 mg total) by mouth daily.     chlorhexidine 0.12 % solution  Commonly known as:  PERIDEX  Use as directed 5 mLs in the mouth or throat 2 (two) times daily as needed. AS DIRECTED ON BOTTLE     cycloSPORINE 0.05 % ophthalmic emulsion  Commonly known as:  RESTASIS  Place 1 drop into both eyes 2 (two) times daily.     diltiazem 120 MG 24 hr capsule  Commonly known as:  CARDIZEM CD  Take 120 mg by mouth daily.     DULoxetine 30 MG capsule  Commonly known as:  CYMBALTA  Take 90 mg by mouth daily.     EPINEPHrine 0.3 mg/0.3 mL Soaj injection  Commonly known as:  EPI-PEN  Inject 0.3 mg into the  muscle as needed (FOR ALLERGIES).     Fish Oil 1200 MG Caps  Take 2 capsules by mouth daily.     hydrochlorothiazide 25 MG tablet  Commonly known as:  HYDRODIURIL  Take 12.5 mg by mouth daily.     hydrOXYzine 10 MG tablet  Commonly known as:  ATARAX/VISTARIL  Take 1 tablet (10 mg total) by mouth every 8 (eight) hours as needed for itching.     insulin detemir 100 UNIT/ML injection  Commonly known as:  LEVEMIR  Inject 70 Units into the skin 2 (two) times daily. 70 units twice daily     loratadine 10 MG tablet  Commonly known as:  CLARITIN  Take 10 mg by mouth daily.     meclizine 25 MG tablet  Commonly known as:  ANTIVERT  Take 25 mg by mouth 3 (three) times daily as needed for dizziness or nausea.     MIRALAX powder  Generic drug:  polyethylene glycol powder  Take 17 g by mouth as needed for mild constipation.     NOVOLOG FLEXPEN [place]  Inject 12-30 Units into the skin 3 (three) times daily. Per Sliding Scale    70-100; 12 units    100-150; 18 units   151-200; 22 units   201-250; 26 units    251-300; 30 units     oxyCODONE 5 MG immediate release tablet  Commonly known as:  Oxy IR/ROXICODONE  Take 1 tablet (5 mg total) by mouth every 6 (six) hours as needed for moderate pain.     oxyCODONE-acetaminophen 5-325 MG per tablet  Commonly known as:  ROXICET  Take 1-2 tablets by mouth every 6 (six) hours as needed for severe pain.     potassium chloride SA 20 MEQ tablet  Commonly known as:  K-DUR,KLOR-CON  Take 1 tablet (20 mEq total) by mouth daily.     traMADol 50 MG tablet  Commonly known as:  ULTRAM  Take 1 tablet (50 mg total) by mouth every 6 (six) hours as needed.     traZODone 50 MG tablet  Commonly known as:  DESYREL  Take 25 mg by mouth at bedtime.     ZEGERID 40-1100 MG per capsule  Generic drug:  omeprazole-sodium bicarbonate  Take 1 capsule by mouth at bedtime.       Verbal and written Discharge instructions given to the patient. Wound care per Discharge  AVS     Follow-up Information    Follow up with Durene Cal, MD In 2 weeks.   Specialties:  Vascular Surgery, Cardiology   Why:  Our office will call you to arrange an appointment (sent)   Contact information:   [address] [place] [address] [phone]       Signed: Clinton Gallant [facility] 11/05/2014, 1:39 PM  --- For VQI Registry use --- Instructions: Press F2 to tab through selections.  Delete question if not applicable.   Modified Rankin score at D/C (0-6): Rankin Score=0  IV medication needed for:  1. Hypertension: No 2. Hypotension: No  Post-op Complications: No  1. Post-op CVA or TIA: No  If yes: Event classification (right eye, left eye, right cortical, left cortical, verterobasilar, other):   If yes: Timing of event (intra-op, <6 hrs post-op, >=6 hrs post-op, unknown):   2. CN injury: No  If yes: CN  injuried   3. Myocardial infarction: No  If yes: Dx by (EKG or clinical, Troponin):   4.  CHF: No  5.  Dysrhythmia (new): No  6. Wound infection: No  7. Reperfusion symptoms: No  8. Return to OR: No  If yes: return to OR for (bleeding, neurologic, other CEA incision, other):   Discharge medications: Statin  use:  Yes ASA use:  Yes Beta blocker use:  Yes ACE-Inhibitor use:  No  for medical reason   P2Y12 Antagonist use: [x ] None,  Plavix,  Plasugrel,  Ticlopinine,  Ticagrelor,  Other,  No for medical reason,  Non-compliant,  Not-indicated Anti-coagulant use:  [x ] None,  Warfarin,  Rivaroxaban,  Dabigatran,  Other,  No for medical reason,  Non-compliant,  Not-indicated

## 2014-11-12 ENCOUNTER — Other Ambulatory Visit: Payer: Self-pay | Admitting: Interventional Cardiology

## 2014-11-13 ENCOUNTER — Other Ambulatory Visit: Payer: Self-pay

## 2014-11-13 NOTE — Telephone Encounter (Signed)
Please advise on refill. Looks like patient thought the medication was making her nauseas and she has not had it filled since January 2016. Thanks, MI

## 2014-11-14 ENCOUNTER — Encounter: Payer: Self-pay | Admitting: Surgery

## 2014-11-14 ENCOUNTER — Encounter: Payer: Self-pay | Admitting: Cardiology

## 2014-11-17 ENCOUNTER — Ambulatory Visit (INDEPENDENT_AMBULATORY_CARE_PROVIDER_SITE_OTHER): Payer: Self-pay | Admitting: Surgery

## 2014-11-17 ENCOUNTER — Encounter: Payer: Self-pay | Admitting: Surgery

## 2014-11-17 VITALS — BP 151/68 | HR 90 | Temp 98.3°F | Ht 63.0 in | Wt 148.9 lb

## 2014-11-17 DIAGNOSIS — I6523 Occlusion and stenosis of bilateral carotid arteries: Secondary | ICD-10-CM

## 2014-11-17 NOTE — Progress Notes (Signed)
Patient name: [name] MRN: [id number] DOB: 1945/10/12 Sex: female     Chief Complaint  Patient presents with  . Routine Post Op    2 wk f/u s/p L CEA - c/o having difficulty chewing/numbness     HISTORY OF PRESENT ILLNESS: The patient is back for follow-up.  She is status post left carotid endarterectomy with patch angioplasty on 10/31/2014.  This was done for asymptomatic left carotid stenosis.  Intraoperative findings included a isolated focal ulcerated lesion at the carotid bifurcation. She did have the internal and external carotid artery rotated 180 apart.  Her postoperative course was uncomplicated.  Her only complaints today are that are numbness around her incision and her jaw as well as drooping of the left corner of her mouth.  She denies any neurologic symptoms.  Past Medical History  Diagnosis Date  . Total knee replacement status   . Disorders of sacrum   . Depressive disorder, not elsewhere classified   . Pain in joint, lower leg     bilateral  . Unspecified asthma(493.90)   . Cardiac pacemaker medtronic   . Chronotropic incompetence with sinus node dysfunction   . Obstructive sleep apnea (adult) (pediatric)   . Anxiety state, unspecified   . Other voice and resonance disorders   . Bronchitis, not specified as acute or chronic   . Allergic rhinitis, cause unspecified   . Pulmonary embolism   . Hypertension   . Atrial fibrillation   . Arthritis   . Unspecified cerebral artery occlusion with cerebral infarction     high-grade left ICA stenosis  . LBBB (left bundle branch block)   . Type II or unspecified type diabetes mellitus without mention of complication, not stated as uncontrolled     Type II  . GERD (gastroesophageal reflux disease)   . Headache(784.0)     history of  . Myalgia and myositis, unspecified   . Vertigo     Meclizine (Antivert)  . Presence of permanent cardiac pacemaker   . Heart murmur   . Rheumatic fever in pediatric patient    . History of hiatal hernia     Past Surgical History  Procedure Laterality Date  . Total knee arthroplasty  2001, 2010, 2012    right  . Total knee repair  2010    right  . Vesicovaginal fistula closure w/ tah  1974  . Bilateral salpingoophorectomy  1991  . Cholecystectomy    . Nasal fracture surgery    . Temporomandibular joint arthroplasty  1982  . Esophagogastroduodenoscopy  11/02/2011    Procedure: ESOPHAGOGASTRODUODENOSCOPY (EGD);  Surgeon: Vertell Novak., MD;  Location: [facility];  Service: Endoscopy;  Laterality: N/A;  with xray  . Savory dilation  11/02/2011    Procedure: SAVORY DILATION;  Surgeon: Vertell Novak., MD;  Location: [facility];  Service: Endoscopy;  Laterality: N/A;  . Esophagogastroduodenoscopy N/A 06/26/2013    Procedure: ESOPHAGOGASTRODUODENOSCOPY (EGD);  Surgeon: Vertell Novak., MD;  Location: [facility];  Service: Endoscopy;  Laterality: N/A;  . Balloon dilation N/A 06/26/2013    Procedure: BALLOON DILATION;  Surgeon: Vertell Novak., MD;  Location: [facility];  Service: Endoscopy;  Laterality: N/A;  . Colonoscopy N/A 06/26/2013    Procedure: COLONOSCOPY;  Surgeon: Vertell Novak., MD;  Location: [facility];  Service: Endoscopy;  Laterality: N/A;  . Insert / replace / remove pacemaker      insertion 2007  . Elbow surgery Bilateral   .  Endarterectomy Left 10/31/2014    Procedure: LEFT CAROTID ENDARTERECTOMY WITH BOVINE PERICARDIAL PATCH ANGIOPLASTY;  Surgeon: Nada Libman, MD;  Location: [facility] OR;  Service: Vascular;  Laterality: Left;    Social History   Social History  . Marital Status: Married    Spouse Name: N/A  . Number of Children: N/A  . Years of Education: N/A   Occupational History  . Not on file.   Social History Main Topics  . Smoking status: Former Smoker -- 0.30 packs/day for 20 years    Types: Cigarettes    Quit date: 09/12/2001  . Smokeless tobacco: Never Used     Comment: pt smoked less than 1/4 ppd   . Alcohol Use: No  . Drug Use: No  . Sexual Activity: Not on file   Other Topics Concern  . Not on file   Social History Narrative    Family History  Problem Relation Age of Onset  . Cancer Father     LUNG  . Alcohol abuse Father   . Diabetes Mother   . Heart disease Mother   . Stroke Mother   . Heart attack Mother   . Hypertension Mother   . Deep vein thrombosis Mother   . Hyperlipidemia Mother   . Varicose Veins Mother   . Peripheral vascular disease Mother     amputation  . Lupus Sister   . Diabetes Sister   . Aneurysm Sister   . Deep vein thrombosis Sister   . Hyperlipidemia Sister   . Hypertension Sister   . Varicose Veins Sister   . Heart disease Sister   . Deep vein thrombosis Sister   . Diabetes Sister   . Hyperlipidemia Sister   . Hypertension Sister   . Varicose Veins Sister     Allergies as of 11/17/2014 - Review Complete 11/17/2014  Allergen Reaction Noted  . Cephalexin Swelling 05/16/2007  . Penicillins Nausea And Vomiting and Swelling 10/27/2014  . Atenolol  09/10/2014  . Chromic sulfate  09/10/2014  . Codeine Nausea Only 09/10/2014  . Desmopressin  09/10/2014  . Dexilant [dexlansoprazole] Diarrhea 09/10/2014  . Doxycycline Nausea And Vomiting   . Entex    . Farxiga [dapagliflozin]  09/10/2014  . Gabapentin    . Levaquin [levofloxacin] Other (See Comments)   . Lexapro [escitalopram]  09/10/2014  . Lisinopril    . Losartan potassium    . Metformin    . Other  09/10/2014  . Pregabalin    . Rosiglitazone maleate    . Simvastatin    . Sitagliptin phosphate    . Tradjenta [linagliptin] Other (See Comments) 09/10/2014    Current Outpatient Prescriptions on File Prior to Visit  Medication Sig Dispense Refill  . aspirin 81 MG tablet Take 81 mg by mouth daily.      Marland Kitchen atorvastatin (LIPITOR) 20 MG tablet Take 20 mg by mouth daily.      . BD PEN NEEDLE NANO U/F 32G X 4 MM MISC Use as directed for Insulin Injections    . BYSTOLIC 10 MG tablet  TAKE 1 TABLET DAILY 90 tablet 1  . chlorhexidine (PERIDEX) 0.12 % solution Use as directed 5 mLs in the mouth or throat 2 (two) times daily as needed. AS DIRECTED ON BOTTLE  1  . cycloSPORINE (RESTASIS) 0.05 % ophthalmic emulsion Place 1 drop into both eyes 2 (two) times daily.    Marland Kitchen diltiazem (CARDIZEM CD) 120 MG 24 hr capsule Take 120 mg by mouth daily.    Marland Kitchen  DULoxetine (CYMBALTA) 30 MG capsule Take 90 mg by mouth daily.     Marland Kitchen EPINEPHrine 0.3 mg/0.3 mL IJ SOAJ injection Inject 0.3 mg into the muscle as needed (FOR ALLERGIES).    . hydrochlorothiazide (HYDRODIURIL) 25 MG tablet Take 12.5 mg by mouth daily.    . Insulin Aspart (NOVOLOG FLEXPEN [place]) Inject 12-30 Units into the skin 3 (three) times daily. Per Sliding Scale    70-100; 12 units    100-150; 18 units   151-200; 22 units   201-250; 26 units    251-300; 30 units    . insulin detemir (LEVEMIR) 100 UNIT/ML injection Inject 70 Units into the skin 2 (two) times daily. 70 units twice daily    . loratadine (CLARITIN) 10 MG tablet Take 10 mg by mouth daily.    . meclizine (ANTIVERT) 25 MG tablet Take 25 mg by mouth 3 (three) times daily as needed for dizziness or nausea.    . nebivolol (BYSTOLIC) 10 MG tablet Take 1 tablet (10 mg total) by mouth daily. 30 tablet 6  . Omega-3 Fatty Acids (FISH OIL) 1200 MG CAPS Take 2 capsules by mouth daily.      Marland Kitchen omeprazole-sodium bicarbonate (ZEGERID) 40-1100 MG per capsule Take 1 capsule by mouth at bedtime.      . polyethylene glycol powder (MIRALAX) powder Take 17 g by mouth as needed for mild constipation.     . traMADol (ULTRAM) 50 MG tablet Take 1 tablet (50 mg total) by mouth every 6 (six) hours as needed. 20 tablet 0  . traZODone (DESYREL) 50 MG tablet Take 25 mg by mouth at bedtime.    . hydrOXYzine (ATARAX/VISTARIL) 10 MG tablet Take 1 tablet (10 mg total) by mouth every 8 (eight) hours as needed for itching. (Patient not taking: Reported on 10/27/2014) 30 tablet 0  . oxyCODONE (OXY IR/ROXICODONE) 5 MG  immediate release tablet Take 1 tablet (5 mg total) by mouth every 6 (six) hours as needed for moderate pain. (Patient not taking: Reported on 11/17/2014) 30 tablet 0  . oxyCODONE-acetaminophen (ROXICET) 5-325 MG per tablet Take 1-2 tablets by mouth every 6 (six) hours as needed for severe pain. (Patient not taking: Reported on 11/17/2014) 20 tablet 0  . potassium chloride SA (K-DUR,KLOR-CON) 20 MEQ tablet Take 1 tablet (20 mEq total) by mouth daily. (Patient not taking: Reported on 10/20/2014) 30 tablet 11  . [DISCONTINUED] amLODipine (NORVASC) 5 MG tablet Take 1 tablet by mouth daily.     No current facility-administered medications on file prior to visit.     REVIEW OF SYSTEMS: See history of present illness.  PHYSICAL EXAMINATION:   Vital signs are  Filed Vitals:   11/17/14 0849 11/17/14 0852  BP: 158/88 151/68  Pulse: 90   Temp: 98.3 F (36.8 C)   TempSrc: Oral   Height:  (1.6 m)   Weight: 148 lb 14.4 oz (67.541 kg)   SpO2: 98%    Body mass index is 26.38 kg/(m^2). General: The patient appears their stated age. HEENT:  No gross abnormalities Pulmonary:  Non labored breathing Musculoskeletal: There are no major deformities. Neurologic: Marginal mandibular neurapraxia, Skin: There are no ulcer or rashes noted. Psychiatric: The patient has normal affect. Cardiovascular: Carotid incision is healed nicely  Diagnostic Studies None  Assessment: Status post left carotid endarterectomy Plan: The patient has recovered nicely from her operation.  She does have some residual numbness around her incision extending up to her jaw.  She also has a marginal  mandibular neurapraxia.  I reassured her that the marginal mandibular neurapraxia will resolve over time.  She may be left with some residual numbness around her incision.  I will schedule her for follow-up in 9 months.  She has a known 60-79 percent right carotid stenosis  V. Charlena Cross, M.D. [facility] [place] Office: [phone] Pager:  [phone]

## 2014-11-18 ENCOUNTER — Telehealth: Payer: Self-pay | Admitting: Interventional Cardiology

## 2014-11-18 NOTE — Addendum Note (Signed)
Addended by: Adria Dill L on: 11/18/2014 02:20 PM   Modules accepted: Orders

## 2014-11-18 NOTE — Telephone Encounter (Signed)
Pt c/o BP issue: STAT if pt c/o blurred vision, one-sided weakness or slurred speech  1. What are your last 5 BP readings?  9.13.16 155/83 hr 76 this morning   2. Are you having any other symptoms (ex. Dizziness, headache, blurred vision, passed out)? tired  3. What is your BP issue? Pt has high BP and need to speak to nurse.

## 2014-11-18 NOTE — Telephone Encounter (Signed)
Returned pt call. Pt sts that her BP yesterday at her f/u appt with Dr.Brabham was elevated. Pt had not been checking her bp regularly, since her bp was elevated she decided to go ahead and ck it today. This morning her bp was 155/3 76bpm. Pt is currently asymptomatic. She is tired all time, but feel she is recovering well from her  lft carotid endarterectomy. Dr.Smith had tried increasing her Bystolic weeks ago to  qd. She was not able to tolerate the increase and reported nausea and vomitting  Pt though that maybe if she was to take Bystolic  bid she would be able to tolerate it better. Adv her that Dr.Smith will be in the office tomorrow. I will fwd him an update, and see if he feels that increasing to bid would be ok. Pt agreeable and verbalized understanding.

## 2014-11-19 ENCOUNTER — Encounter: Payer: Self-pay | Admitting: Internal Medicine

## 2014-11-19 NOTE — Telephone Encounter (Signed)
Pt aware of Dr.Smith's response.    It is okay to try Bystolic 10 mg twice a day. Call with blood pressure report.      Pt will call the office in 1-2 weeks with he bp readings. Pt agreeable with plan and verbalized understandfing

## 2014-11-19 NOTE — Telephone Encounter (Signed)
It is okay to try Bystolic 10 mg twice a day. Call with blood pressure report.

## 2014-11-24 ENCOUNTER — Encounter: Payer: Self-pay | Admitting: Internal Medicine

## 2014-12-11 DIAGNOSIS — H35369 Drusen (degenerative) of macula, unspecified eye: Secondary | ICD-10-CM | POA: Diagnosis not present

## 2014-12-11 DIAGNOSIS — H3530 Unspecified macular degeneration: Secondary | ICD-10-CM | POA: Diagnosis not present

## 2014-12-11 DIAGNOSIS — E119 Type 2 diabetes mellitus without complications: Secondary | ICD-10-CM | POA: Diagnosis not present

## 2014-12-17 ENCOUNTER — Telehealth: Payer: Self-pay | Admitting: Interventional Cardiology

## 2014-12-17 NOTE — Telephone Encounter (Signed)
[facility] called wanting a clarification on pt taking Bystolic 10 mg. I informed the pharmacist that on 11/18/14. Dr. Katrinka BlazingSmith stated that it was ok for pt to take Bystolic 10 mg twice daily. I advise the pharmacist that if he has any other problems, questions or concerns to give our office a call. Pharmacist verbalized understanding.

## 2014-12-19 ENCOUNTER — Ambulatory Visit: Payer: Medicare Other | Admitting: Internal Medicine

## 2015-01-12 ENCOUNTER — Encounter: Payer: Self-pay | Admitting: Internal Medicine

## 2015-01-13 ENCOUNTER — Telehealth: Payer: Self-pay | Admitting: Cardiology

## 2015-01-13 ENCOUNTER — Ambulatory Visit (INDEPENDENT_AMBULATORY_CARE_PROVIDER_SITE_OTHER): Payer: Medicare Other | Admitting: *Deleted

## 2015-01-13 DIAGNOSIS — I495 Sick sinus syndrome: Secondary | ICD-10-CM | POA: Diagnosis not present

## 2015-01-13 NOTE — Progress Notes (Signed)
Remote pacemaker transmission.   

## 2015-01-13 NOTE — Telephone Encounter (Signed)
Spoke with pt and reminded pt of remote transmission that is due today. Pt verbalized understanding.   

## 2015-01-15 LAB — CUP PACEART REMOTE DEVICE CHECK
Battery Impedance: 1143 Ohm
Battery Voltage: 2.74 V
Brady Statistic AP VP Percent: 37 %
Brady Statistic AP VS Percent: 0 %
Brady Statistic AS VP Percent: 63 %
Date Time Interrogation Session: 20161108184806
Implantable Lead Implant Date: 20081113
Implantable Lead Model: 5076
Lead Channel Impedance Value: 500 Ohm
Lead Channel Pacing Threshold Amplitude: 0.375 V
Lead Channel Pacing Threshold Pulse Width: 0.4 ms
Lead Channel Sensing Intrinsic Amplitude: 2.8 mV
Lead Channel Setting Pacing Amplitude: 2.5 V
Lead Channel Setting Sensing Sensitivity: 4 mV
MDC IDC LEAD IMPLANT DT: 20081113
MDC IDC LEAD LOCATION: 753859
MDC IDC LEAD LOCATION: 753860
MDC IDC MSMT BATTERY REMAINING LONGEVITY: 39 mo
MDC IDC MSMT LEADCHNL RV IMPEDANCE VALUE: 743 Ohm
MDC IDC MSMT LEADCHNL RV PACING THRESHOLD AMPLITUDE: 0.625 V
MDC IDC MSMT LEADCHNL RV PACING THRESHOLD PULSEWIDTH: 0.4 ms
MDC IDC SET LEADCHNL RA PACING AMPLITUDE: 2 V
MDC IDC SET LEADCHNL RV PACING PULSEWIDTH: 0.4 ms
MDC IDC STAT BRADY AS VS PERCENT: 0 %

## 2015-01-16 ENCOUNTER — Encounter: Payer: Self-pay | Admitting: Cardiology

## 2015-01-27 ENCOUNTER — Ambulatory Visit (INDEPENDENT_AMBULATORY_CARE_PROVIDER_SITE_OTHER): Payer: Medicare Other

## 2015-01-27 ENCOUNTER — Telehealth: Payer: Self-pay | Admitting: Internal Medicine

## 2015-01-27 DIAGNOSIS — J309 Allergic rhinitis, unspecified: Secondary | ICD-10-CM

## 2015-01-27 NOTE — Telephone Encounter (Signed)
Allergy Serum Extract Date Mixed: 01/27/15 Vial: AB Strength: 1:50 Here/Mail/Pick Up: mail Mixed By: tbs Last OV: 12/18/13 Pending OV: 02/26/15

## 2015-02-19 ENCOUNTER — Other Ambulatory Visit: Payer: Self-pay

## 2015-02-19 DIAGNOSIS — Z1231 Encounter for screening mammogram for malignant neoplasm of breast: Secondary | ICD-10-CM

## 2015-02-26 ENCOUNTER — Encounter: Payer: Self-pay | Admitting: Internal Medicine

## 2015-02-26 ENCOUNTER — Ambulatory Visit: Payer: Medicare Other | Admitting: Internal Medicine

## 2015-02-26 VITALS — BP 108/62 | HR 63 | Ht 63.0 in | Wt 151.8 lb

## 2015-02-26 DIAGNOSIS — Z23 Encounter for immunization: Secondary | ICD-10-CM

## 2015-02-26 NOTE — Progress Notes (Signed)
12/21/10- 69 year old female former smoker followed for rhinoconjunctivitis, asthma, obstructive sleep apnea, history DVT Last here- 12/21/2009 She she has had replacement of her previous total knee joint replacement on the right and is now feeling better. Because of repeated knee surgeries, she has not driven in 3 years. Not using CPAP, blaming problems with her gums. She had needed some grafts and thinks pressure from her CPAP damage those so that might need to be done again. She has lost 15-18 pounds. Husband says she is not snoring. 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 Now has a cat but won't let in the bedroom. Allergy symptoms have not changed much but she notices poor sense of taste and smell. She uses over-the-counter generic allergy pills. Dentist had given Z-Pak for sinusitis. She continues allergy vaccine at 1:10 without problem.  05/18/11- 69 year old female former smoker followed for rhinoconjunctivitis, asthma, obstructive sleep apnea, history DVT Here to update allergy skin tests: Perennial nasal congestion. Off antihistamines, c/o nasal itching,increased postnasal drip,  some green, frontal and maxillary pressure- worse starting last night. Not a cold. . . Allergy Skin Test-  Positive intradermal tests for grass, weed, tree pollens, dust, some molds  06/28/11- 69 year old female former smoker followed for rhinoconjunctivitis, asthma, obstructive sleep apnea, history DVT Acute visit-states unable to breathe and feels like she cant get air; nasal congestion; no taste or smell since 10-2010 (been happening that long) Comes in tearful and anxious today. Husband is with her. She dates decreased sense of taste and smell from the time of her knee surgery in August of 2012. Can't breathe well through her nose. Face hurts, ears stopped up, sneezing. Complains mostly of the sense of pressure through her frontal and maxillary areas and ears. She is using up her current vaccine supply with intention of  restarting with new mix from skin testing in 3/13..  09/13/11- 69 year old female former smoker followed for rhinoconjunctivitis, asthma, obstructive sleep apnea, history DVT Rebuilding allergy vaccine with new mix.  Pt c/o nasal congestion, dry cough and trouble breathing.  Pt states she feels as though something is "stuck in her throat." She states that this "may be anxiety". She always feels blockage in her nose after history of nasal fracture when a garage door came down on her face. Nasal strips did not help much. She likes Dymista nasal spray. Wears CPAP all night every night, doing well. Sleeps with night guard for bruxism.  11/15/11-  69 year old female former smoker followed for rhinoconjunctivitis, asthma, obstructive sleep apnea, history DVT Rebuilding allergy vaccine with new mix.  Pt states that after last visit nasal congesiton had started to improve but for the past wk it has been worse. She has a clear nasal d/c. Cough is some better since last visit, but does bother her some and is dry. Notes fall pollen. Continues allergy vaccine 1:500, building GO.  Dr Randa EvensEdwards/ GI evaluating known active GERD with Zegerid/ Nexium. Did EGD-> relieved substernal pain she had been trying to blame on "allergy".Tells me she is "much better".  CPAP every night, all night, working well.   06/21/12- 69 year old female former smoker followed for rhinoconjunctivitis, asthma, obstructive sleep apnea, history DVT FOLLOW FOR: pt reports things are "much better" than last at last visit-- states allergies are much better than year, just having some diff w eyes itching--wearing CPAP everynight avg 6hrs per night tolerating well. She was better at the beach than around here. Admits some itching of eyes, nose and face- mild seasonal flare but better than last  year and manage with her meds. She continues allergy vaccine 1:50 G0, which helps. -Continue CPAP 11/[facility], doing well. CT max fac 06/29/11 IMPRESSION:  No  significant sinusitis.  Temporal mandibular joint degeneration bilaterally.  Original Report Authenticated By: Camelia Phenes, M.D.  06/18/13- 69 year old female former smoker followed for rhinoconjunctivitis, asthma, obstructive sleep apnea, history DVT FOLLOWS ZOX:[id number], itchy and watery eyes, runny nose, pressure in sinus area. Still on allergy vaccine 1:50 GO, CPAP 11/ [facility] Aware of seasonal pollen with increased nasal congestion, but not bad.  12/18/13- 69 year old female former smoker followed for rhinoconjunctivitis, asthma, obstructive sleep apnea, history DVT Allergy vaccine 1:50 GO, CPAP 11/ [facility] FOLLOW FOR:  Allergies, OSA, Pt says she uses CPAP sometimes, but every time she uses the CPAP, it causes a rash along the back of her neck, so she doesn't like to use it.   02/26/2015-69 year old female former smoker followed for rhinoconjunctivitis, asthma, obstructive sleep apnea, history DVT Allergy vaccine 1:50 GO, CPAP 11/[facility] QUIT  FOLLOWS FOR:  Pt has not been using CPAP machine since August 2016 d/t having nerve damage after surgery. Still current on allergy injections; feels they are controlling symptoms well.      ROS-see HPI Constitutional:   No-   weight loss, night sweats, fevers, chills, fatigue, lassitude. HEENT:   No-significant headaches, difficulty swallowing, tooth/dental problems, sore throat,       No-  Sneezing,  + itching, + nasal congestion, post nasal drip,  CV:  No-   chest pain, orthopnea, PND, swelling in lower extremities, anasarca, dizziness, palpitations Resp: No-   shortness of breath with exertion or at rest.              No-   productive cough,  No non-productive cough,  No- coughing up of blood.              No-   change in color of mucus.  No- wheezing.   Skin: Itching with sore excoriations GI:  No-   heartburn, indigestion, abdominal pain, nausea, vomiting, GU: . MS:  No-   joint pain or swelling. Neuro-     nothing unusual Psych:   No- change in mood or affect. No depression or anxiety.  No memory loss.  OBJ- Physical Exam General- Alert, Oriented, Affect-calm and pleasant , Distress- none acute Skin- Dry skin with a few excoriations back of neck. Lymphadenopathy- none Head- atraumatic            Eyes- Gross vision intact, PERRLA, conjunctivae and secretions clear            Ears- Hearing, canals-normal            Nose- +stuffy, no-Septal dev, mucus, polyps, erosion, perforation.  +a little nasal speech, sniffing( old nasal trauma)            Throat- Mallampati III-IV , mucosa clear , drainage- none, tonsils- atrophic Neck- flexible , trachea midline, no stridor , thyroid nl, carotid no bruit Chest - symmetrical excursion , unlabored           Heart/CV- RRR , no murmur , no gallop  , no rub, nl s1 s2                            JVD- none , edema- none, stasis changes- none, varices- none           Lung- clear to P&A, wheeze- none, cough- none , dullness-none, rub- none  Chest wall- +L pacemaker Abd-  Br/ Gen/ Rectal- Not done, not indicated Extrem- cyanosis- none, clubbing, none, atrophy- none, strength- nl Neuro- grossly intact to observation

## 2015-02-26 NOTE — Patient Instructions (Signed)
Flu vax  We can continue allergy shots another year

## 2015-03-23 ENCOUNTER — Ambulatory Visit
Admission: RE | Admit: 2015-03-23 | Discharge: 2015-03-23 | Disposition: A | Discharge status: Home / Self Care | Payer: Medicare Other | Source: Ambulatory Visit

## 2015-03-23 DIAGNOSIS — Z1231 Encounter for screening mammogram for malignant neoplasm of breast: Secondary | ICD-10-CM | POA: Diagnosis not present

## 2015-03-24 ENCOUNTER — Other Ambulatory Visit: Payer: Self-pay | Admitting: Family Medicine

## 2015-03-24 DIAGNOSIS — R928 Other abnormal and inconclusive findings on diagnostic imaging of breast: Secondary | ICD-10-CM

## 2015-04-01 ENCOUNTER — Ambulatory Visit
Admission: RE | Admit: 2015-04-01 | Discharge: 2015-04-01 | Disposition: A | Discharge status: Home / Self Care | Payer: Medicare Other | Source: Ambulatory Visit | Attending: Family Medicine | Admitting: Family Medicine

## 2015-04-01 ENCOUNTER — Other Ambulatory Visit: Payer: Self-pay | Admitting: Family Medicine

## 2015-04-01 DIAGNOSIS — N63 Unspecified lump in breast: Secondary | ICD-10-CM | POA: Diagnosis not present

## 2015-04-01 DIAGNOSIS — R928 Other abnormal and inconclusive findings on diagnostic imaging of breast: Secondary | ICD-10-CM

## 2015-04-01 DIAGNOSIS — N6320 Unspecified lump in the left breast, unspecified quadrant: Secondary | ICD-10-CM

## 2015-04-06 ENCOUNTER — Other Ambulatory Visit: Payer: Self-pay | Admitting: Family Medicine

## 2015-04-06 ENCOUNTER — Ambulatory Visit
Admission: RE | Admit: 2015-04-06 | Discharge: 2015-04-06 | Disposition: A | Discharge status: Home / Self Care | Payer: Medicare Other | Source: Ambulatory Visit | Attending: Family Medicine | Admitting: Family Medicine

## 2015-04-06 DIAGNOSIS — N6320 Unspecified lump in the left breast, unspecified quadrant: Secondary | ICD-10-CM

## 2015-04-06 DIAGNOSIS — N6002 Solitary cyst of left breast: Secondary | ICD-10-CM | POA: Diagnosis not present

## 2015-04-22 DIAGNOSIS — K5900 Constipation, unspecified: Secondary | ICD-10-CM | POA: Diagnosis not present

## 2015-04-22 DIAGNOSIS — K219 Gastro-esophageal reflux disease without esophagitis: Secondary | ICD-10-CM | POA: Diagnosis not present

## 2015-04-27 ENCOUNTER — Encounter: Payer: Self-pay | Admitting: Internal Medicine

## 2015-04-27 ENCOUNTER — Ambulatory Visit (INDEPENDENT_AMBULATORY_CARE_PROVIDER_SITE_OTHER): Payer: Medicare Other | Admitting: Internal Medicine

## 2015-04-27 VITALS — BP 122/69 | HR 92 | Ht 63.0 in

## 2015-04-27 DIAGNOSIS — I495 Sick sinus syndrome: Secondary | ICD-10-CM | POA: Diagnosis not present

## 2015-04-27 DIAGNOSIS — Z950 Presence of cardiac pacemaker: Secondary | ICD-10-CM | POA: Diagnosis not present

## 2015-04-27 LAB — CUP PACEART INCLINIC DEVICE CHECK
Battery Remaining Longevity: 36 mo
Battery Voltage: 2.73 V
Brady Statistic AP VP Percent: 43.9 %
Brady Statistic AS VP Percent: 56.1 %
Brady Statistic AS VS Percent: 0.1 % — CL
Implantable Lead Implant Date: 20081113
Implantable Lead Location: 753859
Implantable Lead Location: 753860
Lead Channel Impedance Value: 466 Ohm
Lead Channel Impedance Value: 704 Ohm
Lead Channel Pacing Threshold Amplitude: 0.625 V
Lead Channel Pacing Threshold Pulse Width: 0.4 ms
Lead Channel Setting Pacing Amplitude: 2 V
Lead Channel Setting Pacing Pulse Width: 0.4 ms
Lead Channel Setting Sensing Sensitivity: 4 mV
MDC IDC LEAD IMPLANT DT: 20081113
MDC IDC MSMT BATTERY IMPEDANCE: 1169 Ohm
MDC IDC MSMT LEADCHNL RA PACING THRESHOLD AMPLITUDE: 0.375 V
MDC IDC MSMT LEADCHNL RA SENSING INTR AMPL: 2.8 mV
MDC IDC MSMT LEADCHNL RV PACING THRESHOLD PULSEWIDTH: 0.4 ms
MDC IDC SESS DTM: 20170220122411
MDC IDC SET LEADCHNL RV PACING AMPLITUDE: 2.5 V
MDC IDC STAT BRADY AP VS PERCENT: 0.1 % — AB

## 2015-04-27 MED ORDER — HYDROCHLOROTHIAZIDE 25 MG PO TABS: 12.5000 mg | ORAL_TABLET | Freq: Every day | ORAL | Status: DC

## 2015-04-27 NOTE — Patient Instructions (Addendum)
Medication Instructions: - Your physician recommends that you continue on your current medications as directed. Please refer to the Current Medication list given to you today.  Labwork: - none  Procedures/Testing: - none  Follow-Up: - Remote monitoring is used to monitor your Pacemaker of ICD from home. This monitoring reduces the number of office visits required to check your device to one time per year. It allows us to keep an eye on the functioning of your device to ensure it is working properly. You are scheduled for a device check from home on 07/27/15. You may send your transmission at any time that day. If you have a wireless device, the transmission will be sent automatically. After your physician reviews your transmission, you will receive a postcard with your next transmission date.  - Your physician wants you to follow-up in: November 2017 with Amber Seiler, NP for Dr. Klein. You will receive a reminder letter in the mail two months in advance. If you don't receive a letter, please call our office to schedule the follow-up appointment.  Any Additional Special Instructions Will Be Listed Below (If Applicable).     If you need a refill on your cardiac medications before your next appointment, please call your pharmacy.   

## 2015-04-27 NOTE — Progress Notes (Signed)
Patient Care Team: L.Lupe Carney, MD as PCP - General (Family Medicine) Lyn Records, MD as Consulting Physician (Cardiology) Waymon Budge, MD as Consulting Physician (Pulmonary Disease)   HPI  [name] is a 70 y.o. female Seen in followup. The device was implanted for sinus node dysfunction November 2008 it was associated with a modest improvement in activity.  She is a history of coronary artery disease with moderate-mild diffuse disease of her LAD; this was identified a catheterization 2005. Myoview scan 2008 demonstrated no ischemia.  She has a history of DVT and pulmonary embolism on Coumadin;  now discontinued  Last summer 2016 she underwent surgical repair of carotid artery stenosis   She continues to have significant complaints related to swelling of her left arm. She has undergone physical therapy  which helped some but she is not able to get there easily because of her knees which makes driving difficult.       Past Medical History  Diagnosis Date  . Total knee replacement status   . Disorders of sacrum   . Depressive disorder, not elsewhere classified   . Pain in joint, lower leg     bilateral  . Unspecified asthma(493.90)   . Cardiac pacemaker medtronic   . 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 Obstructive sleep apnea (adult) (pediatric)   . Anxiety state, unspecified   . Other voice and resonance disorders   . Bronchitis, not specified as acute or chronic   . Allergic rhinitis, cause unspecified   . Pulmonary embolism (HCC)   . Hypertension   . Atrial fibrillation (HCC)   . Arthritis   . Unspecified cerebral artery occlusion with cerebral infarction     high-grade left ICA stenosis  . LBBB (left bundle branch block)   . Type II or unspecified type diabetes mellitus without mention of complication, not stated as uncontrolled     Type II  . GERD (gastroesophageal reflux disease)   . Headache(784.0)     history  of  . Myalgia and myositis, unspecified   . Vertigo     Meclizine (Antivert)  . Presence of permanent cardiac pacemaker   . Heart murmur   . Rheumatic fever in pediatric patient   . History of hiatal hernia     Past Surgical History  Procedure Laterality Date  . Total knee arthroplasty  2001, 2010, 2012    right  . Total knee repair  2010    right  . Vesicovaginal fistula closure w/ tah  1974  . Bilateral salpingoophorectomy  1991  . Cholecystectomy    . Nasal fracture surgery    . Temporomandibular joint arthroplasty  1982  . Esophagogastroduodenoscopy  11/02/2011    Procedure: ESOPHAGOGASTRODUODENOSCOPY (EGD);  Surgeon: Vertell Novak., MD;  Location: [facility];  Service: Endoscopy;  Laterality: N/A;  with xray  . Savory dilation  11/02/2011    Procedure: SAVORY DILATION;  Surgeon: Vertell Novak., MD;  Location: [facility];  Service: Endoscopy;  Laterality: N/A;  . Esophagogastroduodenoscopy N/A 06/26/2013    Procedure: ESOPHAGOGASTRODUODENOSCOPY (EGD);  Surgeon: Vertell Novak., MD;  Location: [facility];  Service: Endoscopy;  Laterality: N/A;  . Balloon dilation N/A 06/26/2013    Procedure: BALLOON DILATION;  Surgeon: Vertell Novak., MD;  Location: [facility];  Service: Endoscopy;  Laterality: N/A;  . Colonoscopy N/A 06/26/2013    Procedure: COLONOSCOPY;  Surgeon: Vertell Novak., MD;  Location: [facility];  Service: Endoscopy;  Laterality: N/A;  . Insert / replace / remove pacemaker      insertion 2007  . Elbow surgery Bilateral   . Endarterectomy Left 10/31/2014    Procedure: LEFT CAROTID ENDARTERECTOMY WITH BOVINE PERICARDIAL PATCH ANGIOPLASTY;  Surgeon: Nada Libman, MD;  Location: [facility] OR;  Service: Vascular;  Laterality: Left;    Current Outpatient Prescriptions  Medication Sig Dispense Refill  . aspirin 81 MG tablet Take 81 mg by mouth daily.      Marland Kitchen atorvastatin (LIPITOR) 20 MG tablet Take 20 mg by mouth daily.      . BD PEN NEEDLE NANO  U/F 32G X 4 MM MISC Use as directed for Insulin Injections    . chlorhexidine (PERIDEX) 0.12 % solution Use as directed 5 mLs in the mouth or throat 2 (two) times daily as needed. AS DIRECTED ON BOTTLE  1  . cycloSPORINE (RESTASIS) 0.05 % ophthalmic emulsion Place 1 drop into both eyes 2 (two) times daily.    Marland Kitchen diltiazem (CARDIZEM CD) 120 MG 24 hr capsule Take 120 mg by mouth daily.    . DULoxetine (CYMBALTA) 30 MG capsule Take 90 mg by mouth daily.     Marland Kitchen EPINEPHrine 0.3 mg/0.3 mL IJ SOAJ injection Inject 0.3 mg into the muscle as needed (FOR ALLERGIES).    . hydrochlorothiazide (HYDRODIURIL) 25 MG tablet Take 12.5 mg by mouth daily.    . Insulin Aspart (NOVOLOG FLEXPEN [place]) Inject 12-30 Units into the skin 3 (three) times daily. Per Sliding Scale    70-100; 12 units    100-150; 18 units   151-200; 22 units   201-250; 26 units    251-300; 30 units    . insulin detemir (LEVEMIR) 100 UNIT/ML injection Inject 70 Units into the skin 2 (two) times daily. 70 units twice daily    . loratadine (CLARITIN) 10 MG tablet Take 10 mg by mouth daily.    . meclizine (ANTIVERT) 25 MG tablet Take 25 mg by mouth 3 (three) times daily as needed for dizziness or nausea.    . nebivolol (BYSTOLIC) 10 MG tablet Take 10 mg by mouth 2 (two) times daily. 30 tablet 6  . NONFORMULARY OR COMPOUNDED ITEM Allergy Vaccine 1:50 Given at Home    . Omega-3 Fatty Acids (FISH OIL) 1200 MG CAPS Take 2 capsules by mouth daily.      Marland Kitchen omeprazole-sodium bicarbonate (ZEGERID) 40-1100 MG per capsule Take 1 capsule by mouth at bedtime.      . traZODone (DESYREL) 50 MG tablet Take 25 mg by mouth at bedtime.    . [DISCONTINUED] amLODipine (NORVASC) 5 MG tablet Take 1 tablet by mouth daily.     No current facility-administered medications for this visit.    Allergies  Allergen Reactions  . Cephalexin Swelling    Tongue and lip swelling  . Penicillins Nausea And Vomiting and Swelling    Oral Cephalosporins cause Tongue and Lip Swelling    . Atenolol     Light headed/hair loss  . Chromic Sulfate     Chromic sutures  . Codeine Nausea Only  . Desmopressin   . Dexilant [Dexlansoprazole] Diarrhea  . Doxycycline Nausea And Vomiting  . Entex     REACTION: unknown  . Farxiga [Dapagliflozin]   . Gabapentin     REACTION: unknown  . Levaquin [Levofloxacin] Other (See Comments)    GI upset  . Lexapro [Escitalopram]     Weight gain  . Lisinopril   .  Losartan Potassium   . Metformin     REACTION: unknown  . Other     Bellaryl  . Pregabalin     REACTION: unknown  . Rosiglitazone Maleate     REACTION: unknown  . Simvastatin     REACTION: unknown  . Sitagliptin Phosphate     REACTION: unknown  . Tradjenta [Linagliptin] Other (See Comments)    Joint pain    Review of Systems negative except from HPI and PMH  Physical Exam BP 122/69 mmHg  Pulse 92  Ht  (1.6 m) Alert and oriented in no acute distress HENT- normal except for ongoing sniffling Eyes- EOMI, without scleral icterus Skin- warm and dry; without rashes LN-neg Neck- supple without thyromegaly, JVP-flat, carotids brisk and full without bruits Back-without CVAT or kyphosis Device pocket well healed; without hematoma or erythema.  There is no tethering Lungs-clear to auscultation CV-Regular rate and rhythm, nl S1 and S2, no murmurs gallops or rubs, S4-absent Abd-soft with active bowel sounds; no midline pulsation or hepatomegaly Pulses-intact femoral and distal Swelling and limited motion of her left arm. There is rubric discoloration Neuro- Ax O, CN3-12 intact, grossly normal motor and sensory function Affect engaging   Assessment and  Plan  Sinus node dysfunction the patient has sinus node dysfunction reasonable heart rate excursion.   Pacemaker-Medtronic  The patient's device was interrogated.  The information was reviewed. No changes were made in the programming.     Hypertension   blood pressure is reasonably controlled.  I've encouraged  her to follow up with Dr. Ophelia Charter again about her shoulder to support her major complaints.

## 2015-04-29 DIAGNOSIS — E1165 Type 2 diabetes mellitus with hyperglycemia: Secondary | ICD-10-CM | POA: Diagnosis not present

## 2015-04-29 DIAGNOSIS — I2510 Atherosclerotic heart disease of native coronary artery without angina pectoris: Secondary | ICD-10-CM | POA: Diagnosis not present

## 2015-04-29 DIAGNOSIS — I10 Essential (primary) hypertension: Secondary | ICD-10-CM | POA: Diagnosis not present

## 2015-04-29 DIAGNOSIS — R635 Abnormal weight gain: Secondary | ICD-10-CM | POA: Diagnosis not present

## 2015-04-29 DIAGNOSIS — E7800 Pure hypercholesterolemia, unspecified: Secondary | ICD-10-CM | POA: Diagnosis not present

## 2015-04-30 ENCOUNTER — Encounter: Payer: Self-pay | Admitting: Internal Medicine

## 2015-05-18 DIAGNOSIS — E7800 Pure hypercholesterolemia, unspecified: Secondary | ICD-10-CM | POA: Diagnosis not present

## 2015-05-18 DIAGNOSIS — J309 Allergic rhinitis, unspecified: Secondary | ICD-10-CM | POA: Diagnosis not present

## 2015-05-18 DIAGNOSIS — K219 Gastro-esophageal reflux disease without esophagitis: Secondary | ICD-10-CM | POA: Diagnosis not present

## 2015-05-18 DIAGNOSIS — F329 Major depressive disorder, single episode, unspecified: Secondary | ICD-10-CM | POA: Diagnosis not present

## 2015-05-18 DIAGNOSIS — I10 Essential (primary) hypertension: Secondary | ICD-10-CM | POA: Diagnosis not present

## 2015-05-18 DIAGNOSIS — M150 Primary generalized (osteo)arthritis: Secondary | ICD-10-CM | POA: Diagnosis not present

## 2015-06-15 DIAGNOSIS — M542 Cervicalgia: Secondary | ICD-10-CM | POA: Diagnosis not present

## 2015-06-15 DIAGNOSIS — M1990 Unspecified osteoarthritis, unspecified site: Secondary | ICD-10-CM | POA: Diagnosis not present

## 2015-06-15 DIAGNOSIS — M19042 Primary osteoarthritis, left hand: Secondary | ICD-10-CM | POA: Diagnosis not present

## 2015-06-15 DIAGNOSIS — M064 Inflammatory polyarthropathy: Secondary | ICD-10-CM | POA: Diagnosis not present

## 2015-06-15 DIAGNOSIS — M19041 Primary osteoarthritis, right hand: Secondary | ICD-10-CM | POA: Diagnosis not present

## 2015-06-15 DIAGNOSIS — M797 Fibromyalgia: Secondary | ICD-10-CM | POA: Diagnosis not present

## 2015-06-15 DIAGNOSIS — M47812 Spondylosis without myelopathy or radiculopathy, cervical region: Secondary | ICD-10-CM | POA: Diagnosis not present

## 2015-06-30 DIAGNOSIS — M542 Cervicalgia: Secondary | ICD-10-CM | POA: Diagnosis not present

## 2015-06-30 DIAGNOSIS — M1990 Unspecified osteoarthritis, unspecified site: Secondary | ICD-10-CM | POA: Diagnosis not present

## 2015-06-30 DIAGNOSIS — M064 Inflammatory polyarthropathy: Secondary | ICD-10-CM | POA: Diagnosis not present

## 2015-06-30 DIAGNOSIS — M797 Fibromyalgia: Secondary | ICD-10-CM | POA: Diagnosis not present

## 2015-07-27 ENCOUNTER — Ambulatory Visit (INDEPENDENT_AMBULATORY_CARE_PROVIDER_SITE_OTHER): Payer: Medicare Other | Admitting: *Deleted

## 2015-07-27 ENCOUNTER — Telehealth: Payer: Self-pay | Admitting: Cardiology

## 2015-07-27 DIAGNOSIS — I495 Sick sinus syndrome: Secondary | ICD-10-CM | POA: Diagnosis not present

## 2015-07-27 NOTE — Telephone Encounter (Signed)
Spoke with pt and reminded pt of remote transmission that is due today. Pt verbalized understanding.   

## 2015-07-30 NOTE — Progress Notes (Signed)
Remote pacemaker transmission.   

## 2015-08-07 ENCOUNTER — Encounter: Payer: Self-pay | Admitting: Surgery

## 2015-08-14 LAB — CUP PACEART REMOTE DEVICE CHECK
Battery Impedance: 1332 Ohm
Battery Remaining Longevity: 35 mo
Battery Voltage: 2.73 V
Implantable Lead Location: 753860
Implantable Lead Model: 5076
Lead Channel Impedance Value: 487 Ohm
Lead Channel Setting Pacing Amplitude: 2 V
Lead Channel Setting Pacing Amplitude: 2.5 V
Lead Channel Setting Pacing Pulse Width: 0.4 ms
MDC IDC LEAD IMPLANT DT: 20081113
MDC IDC LEAD IMPLANT DT: 20081113
MDC IDC LEAD LOCATION: 753859
MDC IDC MSMT LEADCHNL RA PACING THRESHOLD AMPLITUDE: 0.5 V
MDC IDC MSMT LEADCHNL RA PACING THRESHOLD PULSEWIDTH: 0.4 ms
MDC IDC MSMT LEADCHNL RA SENSING INTR AMPL: 2.8 mV
MDC IDC MSMT LEADCHNL RV IMPEDANCE VALUE: 754 Ohm
MDC IDC MSMT LEADCHNL RV PACING THRESHOLD AMPLITUDE: 0.625 V
MDC IDC MSMT LEADCHNL RV PACING THRESHOLD PULSEWIDTH: 0.4 ms
MDC IDC SESS DTM: 20170524164723
MDC IDC SET LEADCHNL RV SENSING SENSITIVITY: 4 mV
MDC IDC STAT BRADY AP VP PERCENT: 44 %
MDC IDC STAT BRADY AP VS PERCENT: 0 %
MDC IDC STAT BRADY AS VP PERCENT: 56 %
MDC IDC STAT BRADY AS VS PERCENT: 0 %

## 2015-08-17 ENCOUNTER — Other Ambulatory Visit: Payer: Self-pay | Admitting: Interventional Cardiology

## 2015-08-17 ENCOUNTER — Encounter: Payer: Self-pay | Admitting: Family

## 2015-08-17 ENCOUNTER — Ambulatory Visit (INDEPENDENT_AMBULATORY_CARE_PROVIDER_SITE_OTHER): Payer: Medicare Other | Admitting: Family

## 2015-08-17 ENCOUNTER — Ambulatory Visit (HOSPITAL_COMMUNITY)
Admission: RE | Admit: 2015-08-17 | Discharge: 2015-08-17 | Disposition: A | Discharge status: Home / Self Care | Payer: Medicare Other | Source: Ambulatory Visit | Attending: Surgery | Admitting: Surgery

## 2015-08-17 VITALS — BP 158/78 | HR 75 | Temp 98.3°F | Resp 18 | Ht 63.0 in | Wt 159.0 lb

## 2015-08-17 DIAGNOSIS — I6523 Occlusion and stenosis of bilateral carotid arteries: Secondary | ICD-10-CM

## 2015-08-17 DIAGNOSIS — K219 Gastro-esophageal reflux disease without esophagitis: Secondary | ICD-10-CM | POA: Diagnosis not present

## 2015-08-17 DIAGNOSIS — I447 Left bundle-branch block, unspecified: Secondary | ICD-10-CM | POA: Insufficient documentation

## 2015-08-17 DIAGNOSIS — F329 Major depressive disorder, single episode, unspecified: Secondary | ICD-10-CM | POA: Insufficient documentation

## 2015-08-17 DIAGNOSIS — Z9889 Other specified postprocedural states: Secondary | ICD-10-CM | POA: Diagnosis not present

## 2015-08-17 DIAGNOSIS — I10 Essential (primary) hypertension: Secondary | ICD-10-CM | POA: Insufficient documentation

## 2015-08-17 DIAGNOSIS — G4733 Obstructive sleep apnea (adult) (pediatric): Secondary | ICD-10-CM | POA: Insufficient documentation

## 2015-08-17 DIAGNOSIS — Z48812 Encounter for surgical aftercare following surgery on the circulatory system: Secondary | ICD-10-CM | POA: Diagnosis not present

## 2015-08-17 DIAGNOSIS — E119 Type 2 diabetes mellitus without complications: Secondary | ICD-10-CM | POA: Insufficient documentation

## 2015-08-17 DIAGNOSIS — F419 Anxiety disorder, unspecified: Secondary | ICD-10-CM | POA: Diagnosis not present

## 2015-08-17 NOTE — Patient Instructions (Signed)
Stroke Prevention Some medical conditions and behaviors are associated with an increased chance of having a stroke. You may prevent a stroke by making healthy choices and managing medical conditions. HOW CAN I REDUCE MY RISK OF HAVING A STROKE?   Stay physically active. Get at least 30 minutes of activity on most or all days.  Do not smoke. It may also be helpful to avoid exposure to secondhand smoke.  Limit alcohol use. Moderate alcohol use is considered to be:  No more than 2 drinks per day for men.  No more than 1 drink per day for nonpregnant women.  Eat healthy foods. This involves:  Eating 5 or more servings of fruits and vegetables a day.  Making dietary changes that address high blood pressure (hypertension), high cholesterol, diabetes, or obesity.  Manage your cholesterol levels.  Making food choices that are high in fiber and low in saturated fat, trans fat, and cholesterol may control cholesterol levels.  Take any prescribed medicines to control cholesterol as directed by your health care provider.  Manage your diabetes.  Controlling your carbohydrate and sugar intake is recommended to manage diabetes.  Take any prescribed medicines to control diabetes as directed by your health care provider.  Control your hypertension.  Making food choices that are low in salt (sodium), saturated fat, trans fat, and cholesterol is recommended to manage hypertension.  Ask your health care provider if you need treatment to lower your blood pressure. Take any prescribed medicines to control hypertension as directed by your health care provider.  If you are 18-39 years of age, have your blood pressure checked every 3-5 years. If you are 40 years of age or older, have your blood pressure checked every year.  Maintain a healthy weight.  Reducing calorie intake and making food choices that are low in sodium, saturated fat, trans fat, and cholesterol are recommended to manage  weight.  Stop drug abuse.  Avoid taking birth control pills.  Talk to your health care provider about the risks of taking birth control pills if you are over 35 years old, smoke, get migraines, or have ever had a blood clot.  Get evaluated for sleep disorders (sleep apnea).  Talk to your health care provider about getting a sleep evaluation if you snore a lot or have excessive sleepiness.  Take medicines only as directed by your health care provider.  For some people, aspirin or blood thinners (anticoagulants) are helpful in reducing the risk of forming abnormal blood clots that can lead to stroke. If you have the irregular heart rhythm of atrial fibrillation, you should be on a blood thinner unless there is a good reason you cannot take them.  Understand all your medicine instructions.  Make sure that other conditions (such as anemia or atherosclerosis) are addressed. SEEK IMMEDIATE MEDICAL CARE IF:   You have sudden weakness or numbness of the face, arm, or leg, especially on one side of the body.  Your face or eyelid droops to one side.  You have sudden confusion.  You have trouble speaking (aphasia) or understanding.  You have sudden trouble seeing in one or both eyes.  You have sudden trouble walking.  You have dizziness.  You have a loss of balance or coordination.  You have a sudden, severe headache with no known cause.  You have new chest pain or an irregular heartbeat. Any of these symptoms may represent a serious problem that is an emergency. Do not wait to see if the symptoms will   go away. Get medical help at once. Call your local emergency services (911 in [place] Do not drive yourself to the hospital.   This information is not intended to replace advice given to you by your health care provider. Make sure you discuss any questions you have with your health care provider.   Document Released: 03/31/2004 Document Revised: 03/14/2014 Document Reviewed:  08/24/2012 Elsevier Interactive Patient Education 2016 [employer]  

## 2015-08-17 NOTE — Progress Notes (Signed)
Chief Complaint: Follow up Extracranial Carotid Artery Stenosis   History of Present Illness  [name] is a 70 y.o. female patient of Dr. Lear Ng who is back for follow-up. She is status post left carotid endarterectomy with patch angioplasty on 10/31/2014. This was done for asymptomatic left carotid stenosis. Intraoperative findings included a isolated focal ulcerated lesion at the carotid bifurcation. She did have the internal and external carotid artery rotated 180 apart. Her postoperative course was uncomplicated.  She reports a history of "brain bleed" about 2002 as manifested by dizziness, left eye became "droopy", does not think she had vision changes or speech difficulties at that time.  She denies any stroke or TIA sx's since then.  The patient reports New Medical or Surgical History: pruritic rash on right forearm x 2 weeks; was recently diagnosed with fibromyalgia  Pt Diabetic: yes, states her last A1C was 6.4 Pt smoker: former smoker, quit in 2006, started smoking at age 56  Pt meds include: Statin : yes ASA: yes Other anticoagulants/antiplatelets: no   Past Medical History  Diagnosis Date  . Total knee replacement status   . Disorders of sacrum   . Depressive disorder, not elsewhere classified   . Pain in joint, lower leg     bilateral  . Unspecified asthma(493.90)   . Cardiac pacemaker medtronic   . 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 Obstructive sleep apnea (adult) (pediatric)   . Anxiety state, unspecified   . Other voice and resonance disorders   . Bronchitis, not specified as acute or chronic   . Allergic rhinitis, cause unspecified   . Pulmonary embolism (HCC)   . Hypertension   . Atrial fibrillation (HCC)   . Arthritis   . Unspecified cerebral artery occlusion with cerebral infarction     high-grade left ICA stenosis  . LBBB (left bundle branch block)   . Type II or unspecified type diabetes mellitus without  mention of complication, not stated as uncontrolled     Type II  . GERD (gastroesophageal reflux disease)   . Headache(784.0)     history of  . Myalgia and myositis, unspecified   . Vertigo     Meclizine (Antivert)  . Presence of permanent cardiac pacemaker   . Heart murmur   . Rheumatic fever in pediatric patient   . History of hiatal hernia   . Rash, skin 2 weeks per patient    right arm, inner aspect      Social History Social History  Substance Use Topics  . Smoking status: Former Smoker -- 0.30 packs/day for 20 years    Types: Cigarettes    Quit date: 09/12/2001  . Smokeless tobacco: Never Used     Comment: pt smoked less than 1/4 ppd  . Alcohol Use: No    Family History Family History  Problem Relation Age of Onset  . Cancer Father     LUNG  . Alcohol abuse Father   . Diabetes Mother   . Heart disease Mother   . Stroke Mother   . Heart attack Mother   . Hypertension Mother   . Deep vein thrombosis Mother   . Hyperlipidemia Mother   . Varicose Veins Mother   . Peripheral vascular disease Mother     amputation  . Lupus Sister   . Diabetes Sister   . Aneurysm Sister   . Deep vein thrombosis Sister   . Hyperlipidemia Sister   . Hypertension Sister   .  Varicose Veins Sister   . Heart disease Sister   . Deep vein thrombosis Sister   . Diabetes Sister   . Hyperlipidemia Sister   . Hypertension Sister   . Varicose Veins Sister     Surgical History Past Surgical History  Procedure Laterality Date  . Total knee arthroplasty  2001, 2010, 2012    right  . Total knee repair  2010    right  . Vesicovaginal fistula closure w/ tah  1974  . Bilateral salpingoophorectomy  1991  . Cholecystectomy    . Nasal fracture surgery    . Temporomandibular joint arthroplasty  1982  . Esophagogastroduodenoscopy  11/02/2011    Procedure: ESOPHAGOGASTRODUODENOSCOPY (EGD);  Surgeon: Vertell Novak., MD;  Location: [facility];  Service: Endoscopy;  Laterality: N/A;   with xray  . Savory dilation  11/02/2011    Procedure: SAVORY DILATION;  Surgeon: Vertell Novak., MD;  Location: [facility];  Service: Endoscopy;  Laterality: N/A;  . Esophagogastroduodenoscopy N/A 06/26/2013    Procedure: ESOPHAGOGASTRODUODENOSCOPY (EGD);  Surgeon: Vertell Novak., MD;  Location: [facility];  Service: Endoscopy;  Laterality: N/A;  . Balloon dilation N/A 06/26/2013    Procedure: BALLOON DILATION;  Surgeon: Vertell Novak., MD;  Location: [facility];  Service: Endoscopy;  Laterality: N/A;  . Colonoscopy N/A 06/26/2013    Procedure: COLONOSCOPY;  Surgeon: Vertell Novak., MD;  Location: [facility];  Service: Endoscopy;  Laterality: N/A;  . Insert / replace / remove pacemaker      insertion 2007  . Elbow surgery Bilateral   . Endarterectomy Left 10/31/2014    Procedure: LEFT CAROTID ENDARTERECTOMY WITH BOVINE PERICARDIAL PATCH ANGIOPLASTY;  Surgeon: Nada Libman, MD;  Location: [facility] OR;  Service: Vascular;  Laterality: Left;    Allergies  Allergen Reactions  . Cephalexin Swelling    Tongue and lip swelling  . Penicillins Nausea And Vomiting and Swelling    Oral Cephalosporins cause Tongue and Lip Swelling  . Atenolol     Light headed/hair loss  . Chromic Sulfate     Chromic sutures  . Codeine Nausea Only  . Desmopressin   . Dexilant [Dexlansoprazole] Diarrhea  . Doxycycline Nausea And Vomiting  . Entex     REACTION: unknown  . Farxiga [Dapagliflozin]   . Gabapentin     REACTION: unknown  . Levaquin [Levofloxacin] Other (See Comments)    GI upset  . Lexapro [Escitalopram]     Weight gain  . Lisinopril   . Losartan Potassium   . Metformin     REACTION: unknown  . Other     Bellaryl  . Pregabalin     REACTION: unknown  . Rosiglitazone Maleate     REACTION: unknown  . Simvastatin     REACTION: unknown  . Sitagliptin Phosphate     REACTION: unknown  . Tradjenta [Linagliptin] Other (See Comments)    Joint pain    Current Outpatient  Prescriptions  Medication Sig Dispense Refill  . aspirin 81 MG tablet Take 81 mg by mouth daily.      Marland Kitchen atorvastatin (LIPITOR) 20 MG tablet Take 20 mg by mouth daily.      . BD PEN NEEDLE NANO U/F 32G X 4 MM MISC Use as directed for Insulin Injections    . chlorhexidine (PERIDEX) 0.12 % solution Use as directed 5 mLs in the mouth or throat 2 (two) times daily as needed. AS DIRECTED ON BOTTLE  1  .  cycloSPORINE (RESTASIS) 0.05 % ophthalmic emulsion Place 1 drop into both eyes 2 (two) times daily.    Marland Kitchen. diltiazem (CARDIZEM CD) 120 MG 24 hr capsule Take 120 mg by mouth daily.    . DULoxetine (CYMBALTA) 30 MG capsule Take 90 mg by mouth daily.     Marland Kitchen. EPINEPHrine 0.3 mg/0.3 mL IJ SOAJ injection Inject 0.3 mg into the muscle as needed (FOR ALLERGIES).    . hydrochlorothiazide (HYDRODIURIL) 25 MG tablet Take 0.5 tablets (12.5 mg total) by mouth daily. 45 tablet 11  . Insulin Aspart (NOVOLOG FLEXPEN [place]) Inject 12-30 Units into the skin 3 (three) times daily. Per Sliding Scale    70-100; 12 units    100-150; 18 units   151-200; 22 units   201-250; 26 units    251-300; 30 units    . loratadine (CLARITIN) 10 MG tablet Take 10 mg by mouth daily.    . nebivolol (BYSTOLIC) 10 MG tablet Take 10 mg by mouth 2 (two) times daily. 30 tablet 6  . Omega-3 Fatty Acids (FISH OIL) 1200 MG CAPS Take 2 capsules by mouth daily.      Marland Kitchen. omeprazole-sodium bicarbonate (ZEGERID) 40-1100 MG per capsule Take 1 capsule by mouth at bedtime.      . traZODone (DESYREL) 50 MG tablet Take 25 mg by mouth at bedtime.    . insulin detemir (LEVEMIR) 100 UNIT/ML injection Inject 70 Units into the skin 2 (two) times daily. Reported on 08/17/2015    . meclizine (ANTIVERT) 25 MG tablet Take 25 mg by mouth 3 (three) times daily as needed for dizziness or nausea. Reported on 08/17/2015    . NONFORMULARY OR COMPOUNDED ITEM Reported on 08/17/2015    . [DISCONTINUED] amLODipine (NORVASC) 5 MG tablet Take 1 tablet by mouth daily.     No current  facility-administered medications for this visit.    Review of Systems : See HPI for pertinent positives and negatives.  Physical Examination  Filed Vitals:   08/17/15 1055 08/17/15 1101  BP: 156/70 158/78  Pulse: 75   Temp: 98.3 F (36.8 C)   TempSrc: Oral   Resp: 18   Height: 5\' 3"  (1.6 m)   Weight: 159 lb (72.122 kg)   SpO2: 94%    Body mass index is 28.17 kg/(m^2).  General: WDWN female in NAD GAIT: normal Eyes: PERRLA Pulmonary:  Respirations are non-labored, CTAB  Cardiac: regular rhythm, no detected murmur, pacemaker left upper chest (subcutaneous).  VASCULAR EXAM Carotid Bruits Right Left   Negative Negative    Aorta is not palpable. Radial pulses are 2+ palpable and equal.                                                                                                                            LE Pulses Right Left       POPLITEAL  not palpable   not palpable       POSTERIOR TIBIAL  not palpable  not palpable        DORSALIS PEDIS      ANTERIOR TIBIAL  palpable   palpable     Gastrointestinal: soft, nontender, BS WNL, no r/g,  no palpable masses.  Musculoskeletal: no muscle atrophy/wasting. M/S 5/5 throughout except 4/5 in lower extremities, extremities without ischemic changes.  Skin: Pruritic macular rash at inner aspect right forearm measuring about 5 cm x 10 cm.  Neurologic: A&O X 3; Appropriate Affect, Speech is normal CN 2-12 intact except left facial droop with smile, tongue protrudes to the right, pain and light touch intact in extremities, Motor exam as listed above.   Non-Invasive Vascular Imaging CAROTID DUPLEX 08/17/2015   Right ICA: 40 - 59 % stenosis. Left ICA: CEA site with no restenosis.   Assessment: [name] is a 70 y.o. female who is status post left carotid endarterectomy with patch angioplasty on 10/31/2014. This was done for asymptomatic left carotid stenosis. Intraoperative findings included a isolated focal  ulcerated lesion at the carotid bifurcation. She reports a history of "brain bleed" about 2002 as manifested by dizziness, left eye became "droopy", does not think she had vision changes or speech difficulties at that time.  She denies any stroke or TIA sx's since then.  Today's carotid duplex suggests right ICA stenosis remains stable since exam of 10/20/14; left CEA since that exam with no restenosis.   I advised pt to see her PCP re her 2 week hx of right forearm pruritic rash, and that she reports her blood sugar has been a little higher in the last 2 weeks.  Her atherosclerotic risk factors include well controlled DM currently and former smoker.  Plan: Follow-up in 6 months with Carotid Duplex scan.   I discussed in depth with the patient the nature of atherosclerosis, and emphasized the importance of maximal medical management including strict control of blood pressure, blood glucose, and lipid levels, obtaining regular exercise, and continued cessation of smoking.  The patient is aware that without maximal medical management the underlying atherosclerotic disease process will progress, limiting the benefit of any interventions. The patient was given information about stroke prevention and what symptoms should prompt the patient to seek immediate medical care. Thank you for allowing [place] to participate in this patient's care.  Charisse March, RN, MSN, FNP-C [facility] Office: [phone]  Clinic Physician: Myra Gianotti  08/17/2015 11:12 AM

## 2015-08-17 NOTE — Progress Notes (Signed)
Filed Vitals:   08/17/15 1055 08/17/15 1101  BP: 156/70 158/78  Pulse: 75   Temp: 98.3 F (36.8 C)   TempSrc: Oral   Resp: 18   Height: 5\' 3"  (1.6 m)   Weight: 159 lb (72.122 kg)   SpO2: 94%

## 2015-08-18 DIAGNOSIS — L309 Dermatitis, unspecified: Secondary | ICD-10-CM | POA: Diagnosis not present

## 2015-08-18 DIAGNOSIS — E7800 Pure hypercholesterolemia, unspecified: Secondary | ICD-10-CM | POA: Diagnosis not present

## 2015-08-18 DIAGNOSIS — I10 Essential (primary) hypertension: Secondary | ICD-10-CM | POA: Diagnosis not present

## 2015-08-20 ENCOUNTER — Encounter: Payer: Self-pay | Admitting: Cardiology

## 2015-09-07 DIAGNOSIS — E1165 Type 2 diabetes mellitus with hyperglycemia: Secondary | ICD-10-CM | POA: Diagnosis not present

## 2015-09-07 DIAGNOSIS — I10 Essential (primary) hypertension: Secondary | ICD-10-CM | POA: Diagnosis not present

## 2015-09-23 DIAGNOSIS — I2510 Atherosclerotic heart disease of native coronary artery without angina pectoris: Secondary | ICD-10-CM | POA: Diagnosis not present

## 2015-09-23 DIAGNOSIS — R32 Unspecified urinary incontinence: Secondary | ICD-10-CM | POA: Diagnosis not present

## 2015-09-23 DIAGNOSIS — E7800 Pure hypercholesterolemia, unspecified: Secondary | ICD-10-CM | POA: Diagnosis not present

## 2015-09-23 DIAGNOSIS — E1165 Type 2 diabetes mellitus with hyperglycemia: Secondary | ICD-10-CM | POA: Diagnosis not present

## 2015-09-23 DIAGNOSIS — I10 Essential (primary) hypertension: Secondary | ICD-10-CM | POA: Diagnosis not present

## 2015-09-24 DIAGNOSIS — I872 Venous insufficiency (chronic) (peripheral): Secondary | ICD-10-CM | POA: Diagnosis not present

## 2015-09-24 DIAGNOSIS — L281 Prurigo nodularis: Secondary | ICD-10-CM | POA: Diagnosis not present

## 2015-10-01 NOTE — Addendum Note (Signed)
Addended by: Melodye Ped C on: 10/01/2015 10:40 AM   Modules accepted: Orders

## 2015-10-07 DIAGNOSIS — I10 Essential (primary) hypertension: Secondary | ICD-10-CM | POA: Diagnosis not present

## 2015-10-07 DIAGNOSIS — L0390 Cellulitis, unspecified: Secondary | ICD-10-CM | POA: Diagnosis not present

## 2015-10-07 DIAGNOSIS — F419 Anxiety disorder, unspecified: Secondary | ICD-10-CM | POA: Diagnosis not present

## 2015-10-07 DIAGNOSIS — I2510 Atherosclerotic heart disease of native coronary artery without angina pectoris: Secondary | ICD-10-CM | POA: Diagnosis not present

## 2015-10-15 DIAGNOSIS — M1990 Unspecified osteoarthritis, unspecified site: Secondary | ICD-10-CM | POA: Diagnosis not present

## 2015-10-15 DIAGNOSIS — M064 Inflammatory polyarthropathy: Secondary | ICD-10-CM | POA: Diagnosis not present

## 2015-10-15 DIAGNOSIS — M797 Fibromyalgia: Secondary | ICD-10-CM | POA: Diagnosis not present

## 2015-10-15 DIAGNOSIS — M542 Cervicalgia: Secondary | ICD-10-CM | POA: Diagnosis not present

## 2015-10-21 ENCOUNTER — Encounter (HOSPITAL_COMMUNITY): Payer: Self-pay | Admitting: *Deleted

## 2015-10-21 ENCOUNTER — Inpatient Hospital Stay (HOSPITAL_COMMUNITY)
Admission: EM | Admit: 2015-10-21 | Discharge: 2015-10-23 | DRG: 071 | Disposition: A | Discharge status: Home / Self Care | Payer: Medicare Other | Attending: Family Medicine | Admitting: Family Medicine

## 2015-10-21 ENCOUNTER — Emergency Department (HOSPITAL_COMMUNITY): Payer: Medicare Other

## 2015-10-21 DIAGNOSIS — R410 Disorientation, unspecified: Secondary | ICD-10-CM | POA: Diagnosis not present

## 2015-10-21 DIAGNOSIS — Z794 Long term (current) use of insulin: Secondary | ICD-10-CM

## 2015-10-21 DIAGNOSIS — Z881 Allergy status to other antibiotic agents status: Secondary | ICD-10-CM

## 2015-10-21 DIAGNOSIS — N390 Urinary tract infection, site not specified: Secondary | ICD-10-CM | POA: Diagnosis not present

## 2015-10-21 DIAGNOSIS — G9340 Encephalopathy, unspecified: Principal | ICD-10-CM | POA: Diagnosis present

## 2015-10-21 DIAGNOSIS — Z86711 Personal history of pulmonary embolism: Secondary | ICD-10-CM

## 2015-10-21 DIAGNOSIS — Z950 Presence of cardiac pacemaker: Secondary | ICD-10-CM

## 2015-10-21 DIAGNOSIS — E1165 Type 2 diabetes mellitus with hyperglycemia: Secondary | ICD-10-CM | POA: Diagnosis not present

## 2015-10-21 DIAGNOSIS — F329 Major depressive disorder, single episode, unspecified: Secondary | ICD-10-CM | POA: Diagnosis present

## 2015-10-21 DIAGNOSIS — Z90710 Acquired absence of both cervix and uterus: Secondary | ICD-10-CM

## 2015-10-21 DIAGNOSIS — Z87891 Personal history of nicotine dependence: Secondary | ICD-10-CM

## 2015-10-21 DIAGNOSIS — Z96659 Presence of unspecified artificial knee joint: Secondary | ICD-10-CM | POA: Diagnosis present

## 2015-10-21 DIAGNOSIS — Z7982 Long term (current) use of aspirin: Secondary | ICD-10-CM

## 2015-10-21 DIAGNOSIS — Z885 Allergy status to narcotic agent status: Secondary | ICD-10-CM

## 2015-10-21 DIAGNOSIS — E871 Hypo-osmolality and hyponatremia: Secondary | ICD-10-CM | POA: Diagnosis not present

## 2015-10-21 DIAGNOSIS — Z888 Allergy status to other drugs, medicaments and biological substances status: Secondary | ICD-10-CM

## 2015-10-21 DIAGNOSIS — Z8673 Personal history of transient ischemic attack (TIA), and cerebral infarction without residual deficits: Secondary | ICD-10-CM

## 2015-10-21 DIAGNOSIS — W06XXXA Fall from bed, initial encounter: Secondary | ICD-10-CM | POA: Diagnosis present

## 2015-10-21 DIAGNOSIS — R443 Hallucinations, unspecified: Secondary | ICD-10-CM | POA: Diagnosis present

## 2015-10-21 DIAGNOSIS — S299XXA Unspecified injury of thorax, initial encounter: Secondary | ICD-10-CM | POA: Diagnosis not present

## 2015-10-21 DIAGNOSIS — N179 Acute kidney failure, unspecified: Secondary | ICD-10-CM | POA: Diagnosis not present

## 2015-10-21 DIAGNOSIS — R4182 Altered mental status, unspecified: Secondary | ICD-10-CM | POA: Diagnosis not present

## 2015-10-21 DIAGNOSIS — Z79899 Other long term (current) drug therapy: Secondary | ICD-10-CM

## 2015-10-21 DIAGNOSIS — Z86718 Personal history of other venous thrombosis and embolism: Secondary | ICD-10-CM

## 2015-10-21 DIAGNOSIS — I10 Essential (primary) hypertension: Secondary | ICD-10-CM | POA: Diagnosis present

## 2015-10-21 DIAGNOSIS — Z880 Allergy status to penicillin: Secondary | ICD-10-CM

## 2015-10-21 DIAGNOSIS — E876 Hypokalemia: Secondary | ICD-10-CM | POA: Diagnosis present

## 2015-10-21 DIAGNOSIS — Z823 Family history of stroke: Secondary | ICD-10-CM

## 2015-10-21 DIAGNOSIS — R739 Hyperglycemia, unspecified: Secondary | ICD-10-CM

## 2015-10-21 DIAGNOSIS — E119 Type 2 diabetes mellitus without complications: Secondary | ICD-10-CM

## 2015-10-21 DIAGNOSIS — K219 Gastro-esophageal reflux disease without esophagitis: Secondary | ICD-10-CM | POA: Diagnosis present

## 2015-10-21 LAB — CBC WITH DIFFERENTIAL/PLATELET
BASOS PCT: 0 %
Basophils Absolute: 0 10*3/uL (ref 0.0–0.1)
Eosinophils Absolute: 0.4 10*3/uL (ref 0.0–0.7)
Eosinophils Relative: 5 %
HEMATOCRIT: 40.5 % (ref 36.0–46.0)
HEMOGLOBIN: 14 g/dL (ref 12.0–15.0)
LYMPHS PCT: 43 %
Lymphs Abs: 3.5 10*3/uL (ref 0.7–4.0)
MCH: 28.2 pg (ref 26.0–34.0)
MCHC: 34.6 g/dL (ref 30.0–36.0)
MCV: 81.7 fL (ref 78.0–100.0)
MONO ABS: 0.4 10*3/uL (ref 0.1–1.0)
MONOS PCT: 5 %
NEUTROS ABS: 3.7 10*3/uL (ref 1.7–7.7)
NEUTROS PCT: 47 %
Platelets: 203 10*3/uL (ref 150–400)
RBC: 4.96 MIL/uL (ref 3.87–5.11)
RDW: 13.8 % (ref 11.5–15.5)
WBC: 8 10*3/uL (ref 4.0–10.5)

## 2015-10-21 LAB — URINALYSIS, ROUTINE W REFLEX MICROSCOPIC
Bilirubin Urine: NEGATIVE
HGB URINE DIPSTICK: NEGATIVE
KETONES UR: NEGATIVE mg/dL
Nitrite: NEGATIVE
PROTEIN: NEGATIVE mg/dL
Specific Gravity, Urine: 1.035 — ABNORMAL HIGH (ref 1.005–1.030)
pH: 5.5 (ref 5.0–8.0)

## 2015-10-21 LAB — URINE MICROSCOPIC-ADD ON

## 2015-10-21 LAB — GLUCOSE, CAPILLARY
GLUCOSE-CAPILLARY: 397 mg/dL — AB (ref 65–99)
Glucose-Capillary: 446 mg/dL — ABNORMAL HIGH (ref 65–99)

## 2015-10-21 LAB — COMPREHENSIVE METABOLIC PANEL
ALBUMIN: 3.7 g/dL (ref 3.5–5.0)
ALK PHOS: 96 U/L (ref 38–126)
ALT: 26 U/L (ref 14–54)
ANION GAP: 12 (ref 5–15)
AST: 29 U/L (ref 15–41)
BILIRUBIN TOTAL: 0.7 mg/dL (ref 0.3–1.2)
BUN: 20 mg/dL (ref 6–20)
CALCIUM: 9.4 mg/dL (ref 8.9–10.3)
CO2: 27 mmol/L (ref 22–32)
Chloride: 90 mmol/L — ABNORMAL LOW (ref 101–111)
Creatinine, Ser: 1.21 mg/dL — ABNORMAL HIGH (ref 0.44–1.00)
GFR calc Af Amer: 51 mL/min — ABNORMAL LOW (ref >60)
GFR, EST NON AFRICAN AMERICAN: 44 mL/min — AB (ref >60)
GLUCOSE: 554 mg/dL — AB (ref 65–99)
Potassium: 3.2 mmol/L — ABNORMAL LOW (ref 3.5–5.1)
Sodium: 129 mmol/L — ABNORMAL LOW (ref 135–145)
TOTAL PROTEIN: 6.6 g/dL (ref 6.5–8.1)

## 2015-10-21 LAB — CBG MONITORING, ED: Glucose-Capillary: 502 mg/dL (ref 65–99)

## 2015-10-21 LAB — TSH: TSH: 1.727 u[IU]/mL (ref 0.350–4.500)

## 2015-10-21 MED ORDER — CIPROFLOXACIN HCL 500 MG PO TABS
500.0000 mg | ORAL_TABLET | Freq: Two times a day (BID) | ORAL | Status: DC
  Administered 2015-10-22 – 2015-10-23 (×3): 500 mg via ORAL
  Filled 2015-10-21 (×3): qty 1

## 2015-10-21 MED ORDER — HYDROCHLOROTHIAZIDE 25 MG PO TABS: 12.5000 mg | ORAL_TABLET | Freq: Every day | ORAL | Status: DC

## 2015-10-21 MED ORDER — INSULIN ASPART PROT & ASPART (70-30 MIX) 100 UNIT/ML ~~LOC~~ SUSP
75.0000 [IU] | Freq: Two times a day (BID) | SUBCUTANEOUS | Status: DC
  Administered 2015-10-22 – 2015-10-23 (×3): 75 [IU] via SUBCUTANEOUS
  Filled 2015-10-21: qty 10

## 2015-10-21 MED ORDER — DILTIAZEM HCL ER COATED BEADS 120 MG PO CP24
120.0000 mg | ORAL_CAPSULE | Freq: Every day | ORAL | Status: DC
  Administered 2015-10-22 – 2015-10-23 (×2): 120 mg via ORAL
  Filled 2015-10-21 (×2): qty 1

## 2015-10-21 MED ORDER — NEBIVOLOL HCL 10 MG PO TABS
10.0000 mg | ORAL_TABLET | Freq: Two times a day (BID) | ORAL | Status: DC
  Administered 2015-10-22 – 2015-10-23 (×4): 10 mg via ORAL
  Filled 2015-10-21 (×4): qty 1

## 2015-10-21 MED ORDER — ONDANSETRON HCL 4 MG/2ML IJ SOLN: 4.0000 mg | Freq: Four times a day (QID) | INTRAMUSCULAR | Status: DC | PRN

## 2015-10-21 MED ORDER — INSULIN ASPART 100 UNIT/ML ~~LOC~~ SOLN
0.0000 [IU] | Freq: Three times a day (TID) | SUBCUTANEOUS | Status: DC
Start: 2015-10-22 — End: 2015-10-23
  Administered 2015-10-22: 5 [IU] via SUBCUTANEOUS
  Administered 2015-10-22: 7 [IU] via SUBCUTANEOUS
  Administered 2015-10-22: 3 [IU] via SUBCUTANEOUS
  Administered 2015-10-23: 1 [IU] via SUBCUTANEOUS

## 2015-10-21 MED ORDER — SODIUM CHLORIDE 0.9 % IV BOLUS (SEPSIS)
1000.0000 mL | Freq: Once | INTRAVENOUS | Status: AC
  Administered 2015-10-21: 1000 mL via INTRAVENOUS

## 2015-10-21 MED ORDER — ACETAMINOPHEN 650 MG RE SUPP: 650.0000 mg | Freq: Four times a day (QID) | RECTAL | Status: DC | PRN

## 2015-10-21 MED ORDER — ONDANSETRON HCL 4 MG PO TABS: 4.0000 mg | ORAL_TABLET | Freq: Four times a day (QID) | ORAL | Status: DC | PRN

## 2015-10-21 MED ORDER — DULOXETINE HCL 60 MG PO CPEP
90.0000 mg | ORAL_CAPSULE | Freq: Every day | ORAL | Status: DC
  Administered 2015-10-22 – 2015-10-23 (×2): 90 mg via ORAL
  Filled 2015-10-21 (×2): qty 1

## 2015-10-21 MED ORDER — PANTOPRAZOLE SODIUM 40 MG PO TBEC
40.0000 mg | DELAYED_RELEASE_TABLET | Freq: Every day | ORAL | Status: DC
  Administered 2015-10-22 – 2015-10-23 (×2): 40 mg via ORAL
  Filled 2015-10-21 (×2): qty 1

## 2015-10-21 MED ORDER — CYCLOSPORINE 0.05 % OP EMUL
1.0000 [drp] | Freq: Two times a day (BID) | OPHTHALMIC | Status: DC
  Administered 2015-10-22 – 2015-10-23 (×4): 1 [drp] via OPHTHALMIC
  Filled 2015-10-21 (×4): qty 1

## 2015-10-21 MED ORDER — ATORVASTATIN CALCIUM 20 MG PO TABS
20.0000 mg | ORAL_TABLET | Freq: Every day | ORAL | Status: DC
  Administered 2015-10-22 – 2015-10-23 (×2): 20 mg via ORAL
  Filled 2015-10-21 (×2): qty 1

## 2015-10-21 MED ORDER — CIPROFLOXACIN IN D5W 400 MG/200ML IV SOLN
400.0000 mg | Freq: Once | INTRAVENOUS | Status: AC
  Administered 2015-10-21: 400 mg via INTRAVENOUS
  Filled 2015-10-21: qty 200

## 2015-10-21 MED ORDER — ACETAMINOPHEN 325 MG PO TABS: 650.0000 mg | ORAL_TABLET | Freq: Four times a day (QID) | ORAL | Status: DC | PRN

## 2015-10-21 MED ORDER — ENOXAPARIN SODIUM 40 MG/0.4ML ~~LOC~~ SOLN
40.0000 mg | Freq: Every day | SUBCUTANEOUS | Status: DC
  Administered 2015-10-22 – 2015-10-23 (×2): 40 mg via SUBCUTANEOUS
  Filled 2015-10-21 (×2): qty 0.4

## 2015-10-21 MED ORDER — ASPIRIN EC 81 MG PO TBEC
81.0000 mg | DELAYED_RELEASE_TABLET | Freq: Every day | ORAL | Status: DC
  Administered 2015-10-22 – 2015-10-23 (×2): 81 mg via ORAL
  Filled 2015-10-21 (×2): qty 1

## 2015-10-21 MED ORDER — INSULIN ASPART 100 UNIT/ML ~~LOC~~ SOLN
0.0000 [IU] | Freq: Every day | SUBCUTANEOUS | Status: DC
  Administered 2015-10-22: 5 [IU] via SUBCUTANEOUS

## 2015-10-21 NOTE — ED Notes (Signed)
Recheck ms.Glade o2 sat 97% heart rate 60

## 2015-10-21 NOTE — ED Notes (Signed)
Phlebotomy at bedside.

## 2015-10-21 NOTE — ED Notes (Signed)
MD aware of critical Glucose 554

## 2015-10-21 NOTE — ED Notes (Signed)
Pt transported to CT ?

## 2015-10-21 NOTE — ED Notes (Signed)
Patient transported to CT 

## 2015-10-21 NOTE — ED Notes (Signed)
Pt reports labs have been drawn. Attempting to find the labs.

## 2015-10-21 NOTE — ED Triage Notes (Signed)
Pt in from home, per husband the pt has had "delusions" for 1 wk with thinking that she is in her deceased families house, pt reports having auditory hallucinations of spiders, mice & strangers in the house, pt has urinary incontinence, pt Alert to person, pt confused, pt follows commands, A&O x4

## 2015-10-21 NOTE — ED Provider Notes (Signed)
[facility]-EMERGENCY DEPT Provider Note   CSN: [id number] Arrival date & time: 10/21/15  1311     History   Chief Complaint Chief Complaint  Patient presents with  . Altered Mental Status    HPI [name] is a 70 y.o. female.  HPI  Presenting with AMS. History supplemented by her husband.  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 Husband states that she does not make sense. States that the man behind the mirror she thought was bringing her an anniversary gift.  She discontinued her anxiety medication (vistaril) three days ago but did not resolve hallucinations and confusion. Spoke to her physician, Dr. Jonah Blue and sent to the ED.  No other new medications. Did take one antibiotics for yeast infection and ? UTI recently. Had chills but no fever. Has noted mild cough. Also having dysuria. No abdominal pain, n/v/d.     Past Medical History:  Diagnosis Date  . Allergic rhinitis, cause unspecified   . Anxiety state, unspecified   . Arthritis   . Atrial fibrillation (HCC)   . Bronchitis, not specified as acute or chronic   . Cardiac pacemaker medtronic   . 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 Depressive disorder, not elsewhere classified   . Disorders of sacrum   . GERD (gastroesophageal reflux disease)   . Headache(784.0)    history of  . Heart murmur   . History of hiatal hernia   . Hypertension   . LBBB (left bundle branch block)   . Myalgia and myositis, unspecified   . Obstructive sleep apnea (adult) (pediatric)   . Other voice and resonance disorders   . Pain in joint, lower leg    bilateral  . Presence of permanent cardiac pacemaker   . Pulmonary embolism (HCC)   . Rash, skin 2 weeks per patient   right arm, inner aspect    .  Rheumatic fever in pediatric patient   . Total knee replacement status   . Type II or unspecified type diabetes mellitus without mention of complication, not stated as uncontrolled    Type II  . Unspecified asthma(493.90)   . 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 Vertigo    Meclizine (Antivert)    Patient Active Problem List   Diagnosis Date Noted  . Delirium 10/21/2015  . Asymptomatic carotid artery stenosis 10/31/2014  . Left carotid bruit 08/26/2013  . Palpitations 06/24/2013  . 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 Hypertension   . Itching 01/03/2013  . GERD (gastroesophageal reflux disease) 11/23/2011  . PULMONARY EMBOLISM, hx of 12/22/2008  . RHINOCONJUNCTIVITIS, ALLERGIC 12/20/2007  . SACROILIAC JOINT DYSFUNCTION 09/28/2007  . Depressive disorder, not elsewhere classified 08/28/2007  . KNEE PAIN, RIGHT 08/28/2007  . PAIN IN JOINT OTHER SPECIFIED SITES 08/28/2007  . 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 CARDIAC PACEMAKER IN SITU 06/06/2007  . DIABETES MELLITUS 05/16/2007  . ANXIETY 05/16/2007  . Obstructive sleep apnea 05/16/2007  . CVA 05/16/2007  . BRONCHITIS 05/16/2007  . FIBROMYALGIA 05/16/2007  . FACIAL PAIN 05/16/2007  . DYSPHONIA 05/16/2007  . OTHER DYSPHAGIA 05/16/2007    Past Surgical History:  Procedure Laterality Date  . BALLOON DILATION N/A 06/26/2013   Procedure: BALLOON DILATION;  Surgeon: Llana Aliment  Malon KindleEdwards Jr., MD;  Location: [facility];  Service: Endoscopy;  Laterality: N/A;  . BILATERAL SALPINGOOPHORECTOMY  1991  . CHOLECYSTECTOMY    . COLONOSCOPY N/A 06/26/2013   Procedure: COLONOSCOPY;  Surgeon: Vertell NovakJames L Edwards Jr., MD;  Location: [facility];  Service: Endoscopy;  Laterality: N/A;  . ELBOW SURGERY Bilateral   . ENDARTERECTOMY Left 10/31/2014   Procedure: LEFT CAROTID ENDARTERECTOMY WITH BOVINE PERICARDIAL PATCH ANGIOPLASTY;  Surgeon: Nada LibmanVance W Brabham, MD;   Location: [facility] OR;  Service: Vascular;  Laterality: Left;  . ESOPHAGOGASTRODUODENOSCOPY  11/02/2011   Procedure: ESOPHAGOGASTRODUODENOSCOPY (EGD);  Surgeon: Vertell NovakJames L Edwards Jr., MD;  Location: [facility];  Service: Endoscopy;  Laterality: N/A;  with xray  . ESOPHAGOGASTRODUODENOSCOPY N/A 06/26/2013   Procedure: ESOPHAGOGASTRODUODENOSCOPY (EGD);  Surgeon: Vertell NovakJames L Edwards Jr., MD;  Location: [facility];  Service: Endoscopy;  Laterality: N/A;  . INSERT / REPLACE / REMOVE PACEMAKER     insertion 2007  . NASAL FRACTURE SURGERY    . SAVORY DILATION  11/02/2011   Procedure: SAVORY DILATION;  Surgeon: Vertell NovakJames L Edwards Jr., MD;  Location: [facility];  Service: Endoscopy;  Laterality: N/A;  . TEMPOROMANDIBULAR JOINT ARTHROPLASTY  1982  . TOTAL KNEE ARTHROPLASTY  2001, 2010, 2012   right  . total knee repair  2010   right  . VESICOVAGINAL FISTULA CLOSURE W/ TAH  1974    OB History    No data available       Home Medications    Prior to Admission medications   Medication Sig Start Date End Date Taking? Authorizing Provider  aspirin 81 MG tablet Take 81 mg by mouth daily.     Yes Historical Provider, MD  atorvastatin (LIPITOR) 20 MG tablet Take 20 mg by mouth daily.     Yes Historical Provider, MD  BD PEN NEEDLE NANO U/F 32G X 4 MM MISC Use as directed for Insulin Injections 07/01/11  Yes Historical Provider, MD  chlorhexidine (PERIDEX) 0.12 % solution Use as directed 5 mLs in the mouth or throat 2 (two) times daily as needed. AS DIRECTED ON BOTTLE 08/12/14  Yes Historical Provider, MD  cycloSPORINE (RESTASIS) 0.05 % ophthalmic emulsion Place 1 drop into both eyes 2 (two) times daily.   Yes Historical Provider, MD  diltiazem (CARDIZEM CD) 120 MG 24 hr capsule Take 120 mg by mouth daily.   Yes Historical Provider, MD  DULoxetine (CYMBALTA) 30 MG capsule Take 90 mg by mouth daily.    Yes Historical Provider, MD  EPINEPHrine 0.3 mg/0.3 mL IJ SOAJ injection Inject 0.3 mg into the muscle as needed (FOR  ALLERGIES).   Yes Historical Provider, MD  hydrochlorothiazide (HYDRODIURIL) 25 MG tablet Take 0.5 tablets (12.5 mg total) by mouth daily. 04/27/15  Yes Duke SalviaSteven C Klein, MD  hydrOXYzine (VISTARIL) 25 MG capsule Take 1 capsule by mouth 3 (three) times daily. 10/07/15  Yes Historical Provider, MD  loratadine (CLARITIN) 10 MG tablet Take 10 mg by mouth daily.   Yes Historical Provider, MD  nebivolol (BYSTOLIC) 10 MG tablet Take 1 tablet (10 mg total) by mouth 2 (two) times daily. Please call and schedule an appointment with Dr Katrinka BlazingSmith 08/18/15  Yes Lyn RecordsHenry W Smith, MD  NOVOLOG MIX 70/30 FLEXPEN (70-30) 100 UNIT/ML FlexPen Inject 75 Units into the skin 2 (two) times daily. 09/07/15  Yes Historical Provider, MD  Omega-3 Fatty Acids (FISH OIL) 1200 MG CAPS Take 2 capsules by mouth daily.     Yes Historical Provider, MD  omeprazole-sodium bicarbonate (ZEGERID) 40-1100  MG per capsule Take 1 capsule by mouth at bedtime.     Yes Historical Provider, MD  traZODone (DESYREL) 50 MG tablet Take 25 mg by mouth at bedtime.   Yes Historical Provider, MD    Family History Family History  Problem Relation Age of Onset  . Cancer Father     LUNG  . Alcohol abuse Father   . Diabetes Mother   . Heart disease Mother   . Stroke Mother   . Heart attack Mother   . Hypertension Mother   . Deep vein thrombosis Mother   . Hyperlipidemia Mother   . Varicose Veins Mother   . Peripheral vascular disease Mother     amputation  . Lupus Sister   . Diabetes Sister   . Aneurysm Sister   . Deep vein thrombosis Sister   . Hyperlipidemia Sister   . Hypertension Sister   . Varicose Veins Sister   . Heart disease Sister   . Deep vein thrombosis Sister   . Diabetes Sister   . Hyperlipidemia Sister   . Hypertension Sister   . Varicose Veins Sister     Social History Social History  Substance Use Topics  . Smoking status: Former Smoker    Packs/day: 0.30    Years: 20.00    Types: Cigarettes    Quit date: 09/12/2001  .  Smokeless tobacco: Never Used     Comment: pt smoked less than 1/4 ppd  . Alcohol use No     Allergies   Cephalexin; Penicillins; Atenolol; Chromic sulfate; Codeine; Desmopressin; Dexilant [dexlansoprazole]; Doxycycline; Entex; Farxiga [dapagliflozin]; Gabapentin; Levaquin [levofloxacin]; Lexapro [escitalopram]; Lisinopril; Losartan potassium; Metformin; Other; Pregabalin; Rosiglitazone maleate; Simvastatin; Sitagliptin phosphate; and Tradjenta [linagliptin]   Review of Systems Review of Systems 10/14 systems reviewed and are negative other than those stated in the HPI   Physical Exam Updated Vital Signs BP 133/64   Pulse 71   Temp 97.9 F (36.6 C) (Oral)   Resp 15   Ht 5\' 3"  (1.6 m)   Wt 150 lb (68 kg)   SpO2 95%   BMI 26.57 kg/m   Physical Exam Physical Exam  Nursing note and vitals reviewed. Constitutional: Well developed, well nourished, non-toxic, and in no acute distress Head: Normocephalic and atraumatic.  Mouth/Throat: Oropharynx is clear and moist.  Neck: Normal range of motion. Neck supple.  Cardiovascular: Normal rate and regular rhythm.   Pulmonary/Chest: Effort normal and breath sounds normal.  Abdominal: Soft. There is no tenderness. There is no rebound and no guarding.  Musculoskeletal: Normal range of motion.  Neurological: Alert, oriented, sensation to light touch in tact throughout,  no facial droop, fluent speech, moves all extremities symmetrically Skin: Skin is warm and dry.  Psychiatric: Cooperative   ED Treatments / Results  Labs (all labs ordered are listed, but only abnormal results are displayed) Labs Reviewed  COMPREHENSIVE METABOLIC PANEL - Abnormal; Notable for the following:       Result Value   Sodium 129 (*)    Potassium 3.2 (*)    Chloride 90 (*)    Glucose, Bld 554 (*)    Creatinine, Ser 1.21 (*)    GFR calc non Af Amer 44 (*)    GFR calc Af Amer 51 (*)    All other components within normal limits  URINALYSIS, ROUTINE W  REFLEX MICROSCOPIC (NOT AT [facility]) - Abnormal; Notable for the following:    APPearance CLOUDY (*)    Specific Gravity, Urine 1.035 (*)  Glucose, UA >1000 (*)    Leukocytes, UA MODERATE (*)    All other components within normal limits  URINE MICROSCOPIC-ADD ON - Abnormal; Notable for the following:    Squamous Epithelial / LPF 0-5 (*)    Bacteria, UA FEW (*)    All other components within normal limits  CBG MONITORING, ED - Abnormal; Notable for the following:    Glucose-Capillary 502 (*)    All other components within normal limits  URINE CULTURE  CBC WITH DIFFERENTIAL/PLATELET  TSH    EKG  EKG Interpretation None       Radiology Dg Chest 2 View  Result Date: 10/21/2015 CLINICAL DATA:  Larey Seat out of bed and unable to use arms or legs. EXAM: CHEST  2 VIEW COMPARISON:  10/20/2010 FINDINGS: The lungs are clear wiithout focal pneumonia, edema, pneumothorax or pleural effusion. The cardiopericardial silhouette is within normal limits for size. The visualized bony structures of the thorax are intact. Left pacer remains in place. IMPRESSION: No active cardiopulmonary disease. Electronically Signed   By: Kennith Center M.D.   On: 10/21/2015 18:11   Ct Head Wo Contrast  Result Date: 10/21/2015 CLINICAL DATA:  Initial evaluation for acute altered mental status. Delusions, memory loss. EXAM: CT HEAD WITHOUT CONTRAST TECHNIQUE: Contiguous axial images were obtained from the base of the skull through the vertex without intravenous contrast. COMPARISON:  Prior CT from 01/19/2010. FINDINGS: Scalp soft tissues demonstrate no acute abnormality. No acute abnormality about the globes and orbits. Paranasal sinuses are clear.  No mastoid effusion. Calvarium intact. Diffuse prominence of the CSF containing spaces is compatible with generalized cerebral atrophy. Chronic microvascular ischemic changes present within the periventricular and deep white matter both cerebral hemispheres. No acute intracranial  hemorrhage or evidence for large vessel territory infarct. No mass lesion, midline shift, or mass effect. Ventricular prominence related to global parenchymal volume loss without hydrocephalus. No extra-axial fluid collection. IMPRESSION: 1. No acute intracranial process identified. 2. Generalized cerebral atrophy with mild chronic microvascular ischemic disease. Electronically Signed   By: Rise Mu M.D.   On: 10/21/2015 17:56    Procedures Procedures (including critical care time)  Medications Ordered in ED Medications  ciprofloxacin (CIPRO) IVPB 400 mg (400 mg Intravenous New Bag/Given 10/21/15 2129)  sodium chloride 0.9 % bolus 1,000 mL (0 mLs Intravenous Stopped 10/21/15 2127)  sodium chloride 0.9 % bolus 1,000 mL (0 mLs Intravenous Stopped 10/21/15 2127)     Initial Impression / Assessment and Plan / ED Course  I have reviewed the triage vital signs and the nursing notes.  Pertinent labs & imaging results that were available during my care of the patient were reviewed by me and considered in my medical decision making (see chart for details).  Clinical Course    Presenting with 1-2 weeks of hallucinations and confusion. Vital signs non-concerning. She is grossly neurologically intact other does occasionally answer questions inappropriately. Exam otherwise unremarkable. Concern for delirium she does have evidence of likely a urinary tract infection especially given she does have symptoms of dysuria. Also with significant hyperglycemia and glucose over 500. No evidence of DKA. Given 2 L of IV fluids. Blood work also notable for mild acute kidney injury. Started on ciprofloxacin. I suspect that her hallucinations and confusions are likely due to infection and metabolic derangement. I have discussed this patient with Dr. Kevan Ny who will admit for observation for ongoing treatment.  Final Clinical Impressions(s) / ED Diagnoses   Final diagnoses:  Delirium  Hyperglycemia  UTI  (lower urinary tract infection)    New Prescriptions New Prescriptions   No medications on file     Lavera Guise, MD 10/21/15 2148

## 2015-10-21 NOTE — ED Notes (Signed)
Staff wheeled pt back in wheelchair. Assisted into gown and into bed.

## 2015-10-21 NOTE — H&P (Signed)
History and Physical    [name]:[id number] DOB: 08-01-1945 DOA: 10/21/2015  PCP: Jonah Blue Consultants:  Katrinka Blazing - cardiology; Young - pulmonary Patient coming from: home - lives with husband  Chief Complaint: AMS  HPI: [name] is a 70 y.o. female with medical history significant of afib (not on anticoagulation), CVA.  Patient is unaccompanied at this time.  She reports that she had fallen, doesn't remember when - difficulty moving arms and legs and thought maybe she had hurt herself. "Had a bad memory spell and my memory is still fuzzy."  Was forgetting to check her sugars and take her insulin.  Unsure how long this has been going on.  "IIt's coming back and it's good."  Husband reported hallucinations - she says "now, that's my husband talking."  Strange noises make her uncomfortable and she has been anxious that someone is in the garage or at the door; husband couldn't hear it and when he would check no one would be there.    Does c/o significant dysuria.  Note from Dr. Verdie Mosher indicates that symptoms started 2 days after starting hydroxyzine, but stopping the medication 3 days ago did not resolve the symptoms.   ED Course: Per Dr. Verdie Mosher:  Presenting with 1-2 weeks of hallucinations and confusion. Vital signs non-concerning. She is grossly neurologically intact other does occasionally answer questions inappropriately. Exam otherwise unremarkable. Concern for delirium she does have evidence of likely a urinary tract infection especially given she does have symptoms of dysuria. Also with significant hyperglycemia and glucose over 500. No evidence of DKA. Given 2 L of IV fluids. Blood work also notable for mild acute kidney injury. Started on ciprofloxacin. I suspect that her hallucinations and confusions are likely due to infection and metabolic derangement. I have discussed this patient with Dr. Kevan Ny who will admit for observation for ongoing treatment.  Review of Systems: As per HPI;  otherwise 10 point review of systems reviewed and negative.   Ambulatory Status:  Walks with cane or walker  Past Medical History:  Diagnosis Date  . Allergic rhinitis, cause unspecified   . Anxiety state, unspecified   . Arthritis   . Atrial fibrillation (HCC)   . Bronchitis, not specified as acute or chronic   . Cardiac pacemaker medtronic   . 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 Depressive disorder, not elsewhere classified   . Disorders of sacrum   . GERD (gastroesophageal reflux disease)   . Headache(784.0)    history of  . Heart murmur   . History of hiatal hernia   . Hypertension   . LBBB (left bundle branch block)   . Myalgia and myositis, unspecified   . Obstructive sleep apnea (adult) (pediatric)   . Other voice and resonance disorders   . Pain in joint, lower leg    bilateral  . Presence of permanent cardiac pacemaker   . Pulmonary embolism (HCC)   . Rash, skin 2 weeks per patient   right arm, inner aspect    . Rheumatic fever in pediatric patient   . Total knee replacement status   . Type II or unspecified type diabetes mellitus without mention of complication, not stated as uncontrolled    Type II  . Unspecified asthma(493.90)   . 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 Vertigo    Meclizine (Antivert)    Past Surgical History:  Procedure Laterality Date  . BALLOON  DILATION N/A 06/26/2013   Procedure: BALLOON DILATION;  Surgeon: Vertell Novak., MD;  Location: [facility];  Service: Endoscopy;  Laterality: N/A;  . BILATERAL SALPINGOOPHORECTOMY  1991  . CHOLECYSTECTOMY    . COLONOSCOPY N/A 06/26/2013   Procedure: COLONOSCOPY;  Surgeon: Vertell Novak., MD;  Location: [facility];  Service: Endoscopy;  Laterality: N/A;  . ELBOW SURGERY Bilateral   . ENDARTERECTOMY Left 10/31/2014   Procedure: LEFT CAROTID ENDARTERECTOMY WITH BOVINE PERICARDIAL PATCH ANGIOPLASTY;   Surgeon: Nada Libman, MD;  Location: [facility];  Service: Vascular;  Laterality: Left;  . ESOPHAGOGASTRODUODENOSCOPY  11/02/2011   Procedure: ESOPHAGOGASTRODUODENOSCOPY (EGD);  Surgeon: Vertell Novak., MD;  Location: [facility];  Service: Endoscopy;  Laterality: N/A;  with xray  . ESOPHAGOGASTRODUODENOSCOPY N/A 06/26/2013   Procedure: ESOPHAGOGASTRODUODENOSCOPY (EGD);  Surgeon: Vertell Novak., MD;  Location: [facility];  Service: Endoscopy;  Laterality: N/A;  . INSERT / REPLACE / REMOVE PACEMAKER     insertion 2007  . NASAL FRACTURE SURGERY    . SAVORY DILATION  11/02/2011   Procedure: SAVORY DILATION;  Surgeon: Vertell Novak., MD;  Location: [facility];  Service: Endoscopy;  Laterality: N/A;  . TEMPOROMANDIBULAR JOINT ARTHROPLASTY  1982  . TOTAL KNEE ARTHROPLASTY  2001, 2010, 2012   right  . total knee repair  2010   right  . VESICOVAGINAL FISTULA CLOSURE W/ TAH  1974    Social History   Social History  . Marital status: Married    Spouse name: N/A  . Number of children: N/A  . Years of education: N/A   Occupational History  . Not on file.   Social History Main Topics  . Smoking status: Former Smoker    Packs/day: 0.30    Years: 20.00    Types: Cigarettes    Quit date: 2007  . Smokeless tobacco: Never Used     Comment: pt smoked less than 1/4 ppd  . Alcohol use No  . Drug use: No     Comment: "now, marijuana I would do..." - but doesn't by report  . Sexual activity: Not on file   Other Topics Concern  . Not on file   Social History Narrative  . No narrative on file    Allergies  Allergen Reactions  . Cephalexin Swelling    Tongue and lip swelling  . Penicillins Nausea And Vomiting and Swelling    Oral Cephalosporins cause Tongue and Lip Swelling  . Atenolol     Light headed/hair loss  . Chromic Sulfate     Chromic sutures  . Codeine Nausea Only  . Desmopressin   . Dexilant [Dexlansoprazole] Diarrhea  . Doxycycline Nausea And Vomiting    . Entex     REACTION: unknown  . Farxiga [Dapagliflozin]   . Gabapentin     REACTION: unknown  . Levaquin [Levofloxacin] Other (See Comments)    GI upset  . Lexapro [Escitalopram]     Weight gain  . Lisinopril   . Losartan Potassium   . Metformin     REACTION: unknown  . Other     Bellaryl  . Pregabalin     REACTION: unknown  . Rosiglitazone Maleate     REACTION: unknown  . Simvastatin     REACTION: unknown  . Sitagliptin Phosphate     REACTION: unknown  . Tradjenta [Linagliptin] Other (See Comments)    Joint pain    Family History  Problem Relation Age of Onset  .  Cancer Father     LUNG  . Alcohol abuse Father   . Diabetes Mother   . Heart disease Mother   . Stroke Mother   . Heart attack Mother   . Hypertension Mother   . Deep vein thrombosis Mother   . Hyperlipidemia Mother   . Varicose Veins Mother   . Peripheral vascular disease Mother     amputation  . Lupus Sister   . Diabetes Sister   . Aneurysm Sister   . Deep vein thrombosis Sister   . Hyperlipidemia Sister   . Hypertension Sister   . Varicose Veins Sister   . Heart disease Sister   . Deep vein thrombosis Sister   . Diabetes Sister   . Hyperlipidemia Sister   . Hypertension Sister   . Varicose Veins Sister     Prior to Admission medications   Medication Sig Start Date End Date Taking? Authorizing Provider  aspirin 81 MG tablet Take 81 mg by mouth daily.     Yes Historical Provider, MD  atorvastatin (LIPITOR) 20 MG tablet Take 20 mg by mouth daily.     Yes Historical Provider, MD  BD PEN NEEDLE NANO U/F 32G X 4 MM MISC Use as directed for Insulin Injections 07/01/11  Yes Historical Provider, MD  chlorhexidine (PERIDEX) 0.12 % solution Use as directed 5 mLs in the mouth or throat 2 (two) times daily as needed. AS DIRECTED ON BOTTLE 08/12/14  Yes Historical Provider, MD  cycloSPORINE (RESTASIS) 0.05 % ophthalmic emulsion Place 1 drop into both eyes 2 (two) times daily.   Yes Historical Provider, MD   diltiazem (CARDIZEM CD) 120 MG 24 hr capsule Take 120 mg by mouth daily.   Yes Historical Provider, MD  DULoxetine (CYMBALTA) 30 MG capsule Take 90 mg by mouth daily.    Yes Historical Provider, MD  EPINEPHrine 0.3 mg/0.3 mL IJ SOAJ injection Inject 0.3 mg into the muscle as needed (FOR ALLERGIES).   Yes Historical Provider, MD  hydrochlorothiazide (HYDRODIURIL) 25 MG tablet Take 0.5 tablets (12.5 mg total) by mouth daily. 04/27/15  Yes Duke SalviaSteven C Klein, MD  hydrOXYzine (VISTARIL) 25 MG capsule Take 1 capsule by mouth 3 (three) times daily. 10/07/15  Yes Historical Provider, MD  loratadine (CLARITIN) 10 MG tablet Take 10 mg by mouth daily.   Yes Historical Provider, MD  nebivolol (BYSTOLIC) 10 MG tablet Take 1 tablet (10 mg total) by mouth 2 (two) times daily. Please call and schedule an appointment with Dr Katrinka BlazingSmith 08/18/15  Yes Lyn RecordsHenry W Smith, MD  NOVOLOG MIX 70/30 FLEXPEN (70-30) 100 UNIT/ML FlexPen Inject 75 Units into the skin 2 (two) times daily. 09/07/15  Yes Historical Provider, MD  Omega-3 Fatty Acids (FISH OIL) 1200 MG CAPS Take 2 capsules by mouth daily.     Yes Historical Provider, MD  omeprazole-sodium bicarbonate (ZEGERID) 40-1100 MG per capsule Take 1 capsule by mouth at bedtime.     Yes Historical Provider, MD  traZODone (DESYREL) 50 MG tablet Take 25 mg by mouth at bedtime.   Yes Historical Provider, MD    Physical Exam: Vitals:   10/21/15 2130 10/21/15 2145 10/21/15 2200 10/21/15 2223  BP: 133/64 137/64 123/60 (!) 148/57  Pulse: 71 70 71 69  Resp: 15 17 19 20   Temp:    97.7 F (36.5 C)  TempSrc:    Oral  SpO2: 95% 97% 93% 98%  Weight:    69.7 kg (153 lb 10.6 oz)  Height:    5\' 3"  (  1.6 m)     General: Appears calm and comfortable and is NAD Eyes:  PERRL, EOMI, normal lids, iris ENT:  grossly normal hearing, lips & tongue, mmm Neck:  no LAD, masses or thyromegaly Cardiovascular:  RRR, no m/r/g. No LE edema.  Respiratory: CTA bilaterally, no w/r/r. Normal respiratory  effort. Abdomen:  soft, ntnd, NABS Skin:  no rash or induration seen on limited exam Musculoskeletal:  grossly normal tone BUE/BLE, good ROM, no bony abnormality Psychiatric:  grossly normal mood and affect, speech fluent but somewhat inappropriate - tangential occasionally nonsensical and at other times able to have a full conversation Neurologic:  CN 2-12 grossly intact, moves all extremities in coordinated fashion, sensation intact  Labs on Admission: I have personally reviewed following labs and imaging studies  CBC:  Recent Labs Lab 10/21/15 1822  WBC 8.0  NEUTROABS 3.7  HGB 14.0  HCT 40.5  MCV 81.7  PLT 203   Basic Metabolic Panel:  Recent Labs Lab 10/21/15 1822  NA 129*  K 3.2*  CL 90*  CO2 27  GLUCOSE 554*  BUN 20  CREATININE 1.21*  CALCIUM 9.4   GFR: Estimated Creatinine Clearance: 39.3 mL/min (by C-G formula based on SCr of 1.21 mg/dL). Liver Function Tests:  Recent Labs Lab 10/21/15 1822  AST 29  ALT 26  ALKPHOS 96  BILITOT 0.7  PROT 6.6  ALBUMIN 3.7   No results for input(s): LIPASE, AMYLASE in the last 168 hours. No results for input(s): AMMONIA in the last 168 hours. Coagulation Profile: No results for input(s): INR, PROTIME in the last 168 hours. Cardiac Enzymes: No results for input(s): CKTOTAL, CKMB, CKMBINDEX, TROPONINI in the last 168 hours. BNP (last 3 results) No results for input(s): PROBNP in the last 8760 hours. HbA1C: No results for input(s): HGBA1C in the last 72 hours. CBG:  Recent Labs Lab 10/21/15 1938 10/21/15 2241 10/22/15 0001  GLUCAP 502* 446* 397*   Lipid Profile: No results for input(s): CHOL, HDL, LDLCALC, TRIG, CHOLHDL, LDLDIRECT in the last 72 hours. Thyroid Function Tests:  Recent Labs  10/21/15 1822  TSH 1.727   Anemia Panel: No results for input(s): VITAMINB12, FOLATE, FERRITIN, TIBC, IRON, RETICCTPCT in the last 72 hours. Urine analysis:    Component Value Date/Time   COLORURINE YELLOW  10/21/2015 1925   APPEARANCEUR CLOUDY (A) 10/21/2015 1925   LABSPEC 1.035 (H) 10/21/2015 1925   PHURINE 5.5 10/21/2015 1925   GLUCOSEU >1000 (A) 10/21/2015 1925   HGBUR NEGATIVE 10/21/2015 1925   BILIRUBINUR NEGATIVE 10/21/2015 1925   KETONESUR NEGATIVE 10/21/2015 1925   PROTEINUR NEGATIVE 10/21/2015 1925   UROBILINOGEN 0.2 10/28/2014 1321   NITRITE NEGATIVE 10/21/2015 1925   LEUKOCYTESUR MODERATE (A) 10/21/2015 1925    Creatinine Clearance: Estimated Creatinine Clearance: 39.3 mL/min (by C-G formula based on SCr of 1.21 mg/dL).  Sepsis Labs: @LABRCNTIP (procalcitonin:4,lacticidven:4) )No results found for this or any previous visit (from the past 240 hour(s)).   Radiological Exams on Admission: Dg Chest 2 View  Result Date: 10/21/2015 CLINICAL DATA:  Larey SeatFell out of bed and unable to use arms or legs. EXAM: CHEST  2 VIEW COMPARISON:  10/20/2010 FINDINGS: The lungs are clear wiithout focal pneumonia, edema, pneumothorax or pleural effusion. The cardiopericardial silhouette is within normal limits for size. The visualized bony structures of the thorax are intact. Left pacer remains in place. IMPRESSION: No active cardiopulmonary disease. Electronically Signed   By: Kennith CenterEric  Mansell M.D.   On: 10/21/2015 18:11   Ct Head Wo Contrast  Result Date: 10/21/2015 CLINICAL DATA:  Initial evaluation for acute altered mental status. Delusions, memory loss. EXAM: CT HEAD WITHOUT CONTRAST TECHNIQUE: Contiguous axial images were obtained from the base of the skull through the vertex without intravenous contrast. COMPARISON:  Prior CT from 01/19/2010. FINDINGS: Scalp soft tissues demonstrate no acute abnormality. No acute abnormality about the globes and orbits. Paranasal sinuses are clear.  No mastoid effusion. Calvarium intact. Diffuse prominence of the CSF containing spaces is compatible with generalized cerebral atrophy. Chronic microvascular ischemic changes present within the periventricular and deep white  matter both cerebral hemispheres. No acute intracranial hemorrhage or evidence for large vessel territory infarct. No mass lesion, midline shift, or mass effect. Ventricular prominence related to global parenchymal volume loss without hydrocephalus. No extra-axial fluid collection. IMPRESSION: 1. No acute intracranial process identified. 2. Generalized cerebral atrophy with mild chronic microvascular ischemic disease. Electronically Signed   By: Rise Mu M.D.   On: 10/21/2015 17:56    EKG: Independently reviewed.  AV dual paced with rate 73   Assessment/Plan Principal Problem:   Delirium Active Problems:   Diabetes mellitus, type 2 (HCC)   H/O: CVA (cerebrovascular accident)   Hypertension   UTI (urinary tract infection)   Hyponatremia   Hypokalemia   AKI (acute kidney injury) (HCC)   Delirium -Patient presenting with subacute encephalopathy that is likely related to both hyperglycemia and UTI.   -She has shown some improvement since presentation -Will attempt to address underlying conditions and monitor for improvement -Will place in observation status on Med Surg unit -If ongoing hallucinations once medical conditions are improved, may need psych evaluation  DM -Patient with prior good control, A1c 7.0 in 8/16 -She reports having trouble remembering whether she took her insulin and forgetting to take it -Will resume 70/30 and cover with SSI  UTI -Patient reports dysuria and does have moderate LE on U/A -Urine culture pending -No current concerns for sepsis -Will treat with Cipro  AKI and electrolyte abnormalities -Suspect mild volume deficiency related to poor PO intake in the setting of delirium -Will rehydrate/replete and follow  HTN -Continue home meds: Cardizem, Bystolic -Hold HCTZ in setting of AKI  H/o CVA -Patient may have some underlying vascular dementia making her more susceptible to delirium -Family not present at the time of admission to explore  this  -Continue ASA and statin therapy -No current concern for acute CVA   DVT prophylaxis: Lovenox  Code Status:  Full - confirmed with patient Family Communication: None present   Disposition Plan:  Home once clinically improved Consults called: None  Admission status: Observation - Med Surg    Jonah Blue MD [facility]  If 7PM-7AM, please contact night-coverage [email or web address] Password [email or web address]  10/22/2015, 3:24 AM

## 2015-10-22 DIAGNOSIS — I10 Essential (primary) hypertension: Secondary | ICD-10-CM | POA: Diagnosis present

## 2015-10-22 DIAGNOSIS — R410 Disorientation, unspecified: Secondary | ICD-10-CM | POA: Diagnosis not present

## 2015-10-22 DIAGNOSIS — Z79899 Other long term (current) drug therapy: Secondary | ICD-10-CM | POA: Diagnosis not present

## 2015-10-22 DIAGNOSIS — Z86711 Personal history of pulmonary embolism: Secondary | ICD-10-CM | POA: Diagnosis not present

## 2015-10-22 DIAGNOSIS — N179 Acute kidney failure, unspecified: Secondary | ICD-10-CM | POA: Diagnosis present

## 2015-10-22 DIAGNOSIS — Z950 Presence of cardiac pacemaker: Secondary | ICD-10-CM | POA: Diagnosis not present

## 2015-10-22 DIAGNOSIS — E871 Hypo-osmolality and hyponatremia: Secondary | ICD-10-CM | POA: Diagnosis present

## 2015-10-22 DIAGNOSIS — Z90710 Acquired absence of both cervix and uterus: Secondary | ICD-10-CM | POA: Diagnosis not present

## 2015-10-22 DIAGNOSIS — Z86718 Personal history of other venous thrombosis and embolism: Secondary | ICD-10-CM | POA: Diagnosis not present

## 2015-10-22 DIAGNOSIS — N390 Urinary tract infection, site not specified: Secondary | ICD-10-CM | POA: Diagnosis present

## 2015-10-22 DIAGNOSIS — Z96659 Presence of unspecified artificial knee joint: Secondary | ICD-10-CM | POA: Diagnosis present

## 2015-10-22 DIAGNOSIS — E1165 Type 2 diabetes mellitus with hyperglycemia: Secondary | ICD-10-CM | POA: Diagnosis present

## 2015-10-22 DIAGNOSIS — Z823 Family history of stroke: Secondary | ICD-10-CM | POA: Diagnosis not present

## 2015-10-22 DIAGNOSIS — Z7982 Long term (current) use of aspirin: Secondary | ICD-10-CM | POA: Diagnosis not present

## 2015-10-22 DIAGNOSIS — Z888 Allergy status to other drugs, medicaments and biological substances status: Secondary | ICD-10-CM | POA: Diagnosis not present

## 2015-10-22 DIAGNOSIS — Z794 Long term (current) use of insulin: Secondary | ICD-10-CM | POA: Diagnosis not present

## 2015-10-22 DIAGNOSIS — Z8673 Personal history of transient ischemic attack (TIA), and cerebral infarction without residual deficits: Secondary | ICD-10-CM | POA: Diagnosis not present

## 2015-10-22 DIAGNOSIS — E876 Hypokalemia: Secondary | ICD-10-CM | POA: Diagnosis present

## 2015-10-22 DIAGNOSIS — Z885 Allergy status to narcotic agent status: Secondary | ICD-10-CM | POA: Diagnosis not present

## 2015-10-22 DIAGNOSIS — R443 Hallucinations, unspecified: Secondary | ICD-10-CM | POA: Diagnosis present

## 2015-10-22 DIAGNOSIS — K219 Gastro-esophageal reflux disease without esophagitis: Secondary | ICD-10-CM | POA: Diagnosis present

## 2015-10-22 DIAGNOSIS — W06XXXA Fall from bed, initial encounter: Secondary | ICD-10-CM | POA: Diagnosis present

## 2015-10-22 DIAGNOSIS — Z87891 Personal history of nicotine dependence: Secondary | ICD-10-CM | POA: Diagnosis not present

## 2015-10-22 DIAGNOSIS — G9340 Encephalopathy, unspecified: Secondary | ICD-10-CM | POA: Diagnosis present

## 2015-10-22 DIAGNOSIS — Z880 Allergy status to penicillin: Secondary | ICD-10-CM | POA: Diagnosis not present

## 2015-10-22 DIAGNOSIS — Z881 Allergy status to other antibiotic agents status: Secondary | ICD-10-CM | POA: Diagnosis not present

## 2015-10-22 LAB — CBC
HEMATOCRIT: 34.8 % — AB (ref 36.0–46.0)
HEMOGLOBIN: 12 g/dL (ref 12.0–15.0)
MCH: 28.2 pg (ref 26.0–34.0)
MCHC: 34.5 g/dL (ref 30.0–36.0)
MCV: 81.7 fL (ref 78.0–100.0)
PLATELETS: 146 10*3/uL — AB (ref 150–400)
RBC: 4.26 MIL/uL (ref 3.87–5.11)
RDW: 13.9 % (ref 11.5–15.5)
WBC: 5.7 10*3/uL (ref 4.0–10.5)

## 2015-10-22 LAB — MAGNESIUM: Magnesium: 1.4 mg/dL — ABNORMAL LOW (ref 1.7–2.4)

## 2015-10-22 LAB — BASIC METABOLIC PANEL
ANION GAP: 9 (ref 5–15)
BUN: 13 mg/dL (ref 6–20)
CHLORIDE: 98 mmol/L — AB (ref 101–111)
CO2: 25 mmol/L (ref 22–32)
CREATININE: 0.87 mg/dL (ref 0.44–1.00)
Calcium: 8.7 mg/dL — ABNORMAL LOW (ref 8.9–10.3)
GFR calc non Af Amer: >60 mL/min (ref >60)
Glucose, Bld: 359 mg/dL — ABNORMAL HIGH (ref 65–99)
POTASSIUM: 3 mmol/L — AB (ref 3.5–5.1)
SODIUM: 132 mmol/L — AB (ref 135–145)

## 2015-10-22 LAB — GLUCOSE, CAPILLARY
GLUCOSE-CAPILLARY: 188 mg/dL — AB (ref 65–99)
Glucose-Capillary: 342 mg/dL — ABNORMAL HIGH (ref 65–99)
Glucose-Capillary: 267 mg/dL — ABNORMAL HIGH (ref 65–99)
Glucose-Capillary: 209 mg/dL — ABNORMAL HIGH (ref 65–99)

## 2015-10-22 MED ORDER — POTASSIUM CHLORIDE CRYS ER 20 MEQ PO TBCR
40.0000 meq | EXTENDED_RELEASE_TABLET | Freq: Two times a day (BID) | ORAL | Status: AC
  Administered 2015-10-22 (×2): 40 meq via ORAL
  Filled 2015-10-22 (×2): qty 2

## 2015-10-22 MED ORDER — MAGNESIUM OXIDE 400 (241.3 MG) MG PO TABS
800.0000 mg | ORAL_TABLET | Freq: Once | ORAL | Status: AC
  Administered 2015-10-22: 800 mg via ORAL
  Filled 2015-10-22: qty 2

## 2015-10-22 NOTE — Progress Notes (Signed)
[facility] PROGRESS NOTE  Stacie GlazeLinda J Morin WNU:[id number] DOB: 1945/11/06 DOA: 10/21/2015 PCP: Lupe Carneyean Mitchell, MD   Today: Cont abx. Replace K. Check Mg. Encourage meal completion.   Assessment/Plan: Delirium -Patient presenting with subacute encephalopathy that is likely related to both hyperglycemia and UTI.   -She has shown some improvement since presentation per admitting provider -Cont attempt to address underlying conditions and monitor for improvement -WCont observation status on Med Surg unit -If ongoing hallucinations once medical conditions are improved, may need psych evaluation  DM -Patient with prior good control, A1c 7.0 in 8/16 -She reports having trouble remembering whether she took her insulin and forgetting to take it, husband to assist on d/c -Cont 70/30 and cover with SSI  UTI -Patient reports dysuria and does have moderate LE on U/A -Urine culture pending -No current concerns for sepsis -Will treat with Cipro  AKI (resolved) and electrolyte abnormalities -Suspect mild volume deficiency related to poor PO intake in the setting of delirium -Will rehydrate/replete and follow  HTN -Continue home meds: Cardizem, Bystolic -Hold HCTZ in setting of AKI, poss resume tomorroe  H/o CVA -Patient may have some underlying vascular dementia making her more susceptible to delirium -Family not present at the time of admission to explore this  -Continue ASA and statin therapy -No current concern for acute CVA   DVT prophylaxis: Lovenox  Code Status:  Full - confirmed with patient Family Communication: None present                    Disposition Plan:  Home once clinically improved Consults called: None  Admission status: Observation - Med Surg    Consultants:  none  Procedures:  none  Antibiotics:  Cont cipro (indicate start date, and stop date if known)  HPI/Subjective: Still with hallucinations. No complaints.  Husband at  bedside.  Objective: Vitals:   10/21/15 2223 10/22/15 0410  BP: (!) 148/57 (!) 141/50  Pulse: 69 66  Resp: 20 18  Temp: 97.7 F (36.5 C) 98.3 F (36.8 C)    Intake/Output Summary (Last 24 hours) at 10/22/15 1147 Last data filed at 10/22/15 1018  Gross per 24 hour  Intake             2240 ml  Output              550 ml  Net             1690 ml   Filed Weights   10/21/15 1327 10/21/15 2223  Weight: 68 kg (150 lb) 69.7 kg (153 lb 10.6 oz)    Exam:  General:  No diaphoresis, anxious, no acute distress Cardiovascular: Regular rate and rhythm no murmurs rubs or gallops Respiratory: Clear to auscultation bilaterally no more breathing Abdomen: Nondistended bowel sounds normal nontender palpation Musculoskeletal: Moving all extremities, no deformity, 5 out of 5 strength  Neuro: A&Ox2, not time, hallucinating   Data Reviewed: Basic Metabolic Panel:  Recent Labs Lab 10/21/15 1822 10/22/15 0217  NA 129* 132*  K 3.2* 3.0*  CL 90* 98*  CO2 27 25  GLUCOSE 554* 359*  BUN 20 13  CREATININE 1.21* 0.87  CALCIUM 9.4 8.7*   Liver Function Tests:  Recent Labs Lab 10/21/15 1822  AST 29  ALT 26  ALKPHOS 96  BILITOT 0.7  PROT 6.6  ALBUMIN 3.7   No results for input(s): LIPASE, AMYLASE in the last 168 hours. No results for input(s): AMMONIA in the last 168 hours. CBC:  Recent  Labs Lab 10/21/15 1822 10/22/15 0217  WBC 8.0 5.7  NEUTROABS 3.7  --   HGB 14.0 12.0  HCT 40.5 34.8*  MCV 81.7 81.7  PLT 203 146*   Cardiac Enzymes: No results for input(s): CKTOTAL, CKMB, CKMBINDEX, TROPONINI in the last 168 hours. BNP (last 3 results) No results for input(s): BNP in the last 8760 hours.  ProBNP (last 3 results) No results for input(s): PROBNP in the last 8760 hours.  CBG:  Recent Labs Lab 10/21/15 1938 10/21/15 2241 10/22/15 0001 10/22/15 0740 10/22/15 1118  GLUCAP 502* 446* 397* 342* 267*    No results found for this or any previous visit (from the past  240 hour(s)).   Studies: Dg Chest 2 View  Result Date: 10/21/2015 CLINICAL DATA:  Larey SeatFell out of bed and unable to use arms or legs. EXAM: CHEST  2 VIEW COMPARISON:  10/20/2010 FINDINGS: The lungs are clear wiithout focal pneumonia, edema, pneumothorax or pleural effusion. The cardiopericardial silhouette is within normal limits for size. The visualized bony structures of the thorax are intact. Left pacer remains in place. IMPRESSION: No active cardiopulmonary disease. Electronically Signed   By: Kennith CenterEric  Mansell M.D.   On: 10/21/2015 18:11   Ct Head Wo Contrast  Result Date: 10/21/2015 CLINICAL DATA:  Initial evaluation for acute altered mental status. Delusions, memory loss. EXAM: CT HEAD WITHOUT CONTRAST TECHNIQUE: Contiguous axial images were obtained from the base of the skull through the vertex without intravenous contrast. COMPARISON:  Prior CT from 01/19/2010. FINDINGS: Scalp soft tissues demonstrate no acute abnormality. No acute abnormality about the globes and orbits. Paranasal sinuses are clear.  No mastoid effusion. Calvarium intact. Diffuse prominence of the CSF containing spaces is compatible with generalized cerebral atrophy. Chronic microvascular ischemic changes present within the periventricular and deep white matter both cerebral hemispheres. No acute intracranial hemorrhage or evidence for large vessel territory infarct. No mass lesion, midline shift, or mass effect. Ventricular prominence related to global parenchymal volume loss without hydrocephalus. No extra-axial fluid collection. IMPRESSION: 1. No acute intracranial process identified. 2. Generalized cerebral atrophy with mild chronic microvascular ischemic disease. Electronically Signed   By: Rise MuBenjamin  McClintock M.D.   On: 10/21/2015 17:56    Scheduled Meds: . aspirin EC  81 mg Oral Daily  . atorvastatin  20 mg Oral Daily  . ciprofloxacin  500 mg Oral BID  . cycloSPORINE  1 drop Both Eyes BID  . diltiazem  120 mg Oral Daily   . DULoxetine  90 mg Oral Daily  . enoxaparin (LOVENOX) injection  40 mg Subcutaneous Daily  . insulin aspart  0-5 Units Subcutaneous QHS  . insulin aspart  0-9 Units Subcutaneous TID WC  . insulin aspart protamine- aspart  75 Units Subcutaneous BID WC  . nebivolol  10 mg Oral BID  . pantoprazole  40 mg Oral Daily   Continuous Infusions:   Principal Problem:   Delirium Active Problems:   Diabetes mellitus, type 2 (HCC)   H/O: CVA (cerebrovascular accident)   Hypertension   UTI (urinary tract infection)   Hyponatremia   Hypokalemia   AKI (acute kidney injury) (HCC)    Time spent: 25    Haydee SalterPhillip M Hobbs  [facility] Pager Amion. If 7PM-7AM, please contact night-coverage at [email or web address], password [email or web address] 10/22/2015, 11:47 AM  LOS: 0 days

## 2015-10-22 NOTE — Care Management Obs Status (Signed)
MEDICARE OBSERVATION STATUS NOTIFICATION   Patient Details  Name: [name] GlazeLinda J Krzyzanowski MRN: [id number] Date of Birth: [birth year]   Medicare Observation Status Notification Given:  Yes    CrutchfieldDerrill Memo, Cliffton Spradley M, RN 10/22/2015, 4:08 PM

## 2015-10-23 LAB — BASIC METABOLIC PANEL
ANION GAP: 9 (ref 5–15)
BUN: 10 mg/dL (ref 6–20)
CO2: 26 mmol/L (ref 22–32)
Calcium: 9.4 mg/dL (ref 8.9–10.3)
Chloride: 103 mmol/L (ref 101–111)
Creatinine, Ser: 0.78 mg/dL (ref 0.44–1.00)
GLUCOSE: 76 mg/dL (ref 65–99)
POTASSIUM: 4.3 mmol/L (ref 3.5–5.1)
Sodium: 138 mmol/L (ref 135–145)

## 2015-10-23 LAB — CBC WITH DIFFERENTIAL/PLATELET
BASOS ABS: 0 10*3/uL (ref 0.0–0.1)
Basophils Relative: 0 %
Eosinophils Absolute: 0.3 10*3/uL (ref 0.0–0.7)
Eosinophils Relative: 4 %
HEMATOCRIT: 39.3 % (ref 36.0–46.0)
Hemoglobin: 13.4 g/dL (ref 12.0–15.0)
LYMPHS PCT: 59 %
Lymphs Abs: 3.9 10*3/uL (ref 0.7–4.0)
MCH: 27.9 pg (ref 26.0–34.0)
MCHC: 34.1 g/dL (ref 30.0–36.0)
MCV: 81.9 fL (ref 78.0–100.0)
Monocytes Absolute: 0.5 10*3/uL (ref 0.1–1.0)
Monocytes Relative: 7 %
NEUTROS ABS: 2 10*3/uL (ref 1.7–7.7)
Neutrophils Relative %: 30 %
PLATELETS: 177 10*3/uL (ref 150–400)
RBC: 4.8 MIL/uL (ref 3.87–5.11)
RDW: 13.8 % (ref 11.5–15.5)
WBC: 6.8 10*3/uL (ref 4.0–10.5)

## 2015-10-23 LAB — URINE CULTURE

## 2015-10-23 LAB — MAGNESIUM: Magnesium: 1.8 mg/dL (ref 1.7–2.4)

## 2015-10-23 LAB — HEMOGLOBIN A1C
Hgb A1c MFr Bld: 9.9 % — ABNORMAL HIGH (ref 4.8–5.6)
MEAN PLASMA GLUCOSE: 237 mg/dL

## 2015-10-23 LAB — GLUCOSE, CAPILLARY
GLUCOSE-CAPILLARY: 126 mg/dL — AB (ref 65–99)
GLUCOSE-CAPILLARY: 118 mg/dL — AB (ref 65–99)

## 2015-10-23 MED ORDER — ONDANSETRON HCL 4 MG PO TABS: 4.0000 mg | ORAL_TABLET | Freq: Four times a day (QID) | ORAL | 0 refills | Status: DC | PRN

## 2015-10-23 MED ORDER — CIPROFLOXACIN HCL 500 MG PO TABS: 500.0000 mg | ORAL_TABLET | Freq: Two times a day (BID) | ORAL | 0 refills | Status: DC

## 2015-10-23 MED ORDER — ACETAMINOPHEN 325 MG PO TABS: 650.0000 mg | ORAL_TABLET | Freq: Four times a day (QID) | ORAL | 0 refills | Status: DC | PRN

## 2015-10-23 MED ORDER — CIPROFLOXACIN HCL 500 MG PO TABS: 500.0000 mg | ORAL_TABLET | Freq: Two times a day (BID) | ORAL | 0 refills | Status: AC

## 2015-10-23 NOTE — Progress Notes (Signed)
Inpatient Diabetes Program Recommendations  AACE/ADA: New Consensus Statement on Inpatient Glycemic Control (2015)  Target Ranges:  Prepandial:   less than 140 mg/dL      Peak postprandial:   less than 180 mg/dL (1-2 hours)      Critically ill patients:  140 - 180 mg/dL   Results for [name] GlazeVALLE, [name] (MRN [id number]) as of 10/23/2015 09:57  Ref. Range 10/22/2015 07:40 10/22/2015 11:18 10/22/2015 17:13 10/22/2015 21:08 10/23/2015 07:21  Glucose-Capillary Latest Ref Range: 65 - 99 mg/dL 409342 (H) 811267 (H) 914209 (H) 188 (H) 126 (H)   Review of Glycemic Control  Outpatient Diabetes medications: 70/30 75 units BID Current orders for Inpatient glycemic control: 70/30 75 units BID, Novolog 0-9 units TID with meals, Novolog 0-5 units QHS  Inpatient Diabetes Program Recommendations:  Insulin - Basal: Glucose ranged from 188-342 mg/ld on 10/22/15 and fasting glucose is 126 mg/dl this morning. Please consider increasing morning 70/30 to 80 units QAM with breakfast and continue 70/30 75 units QPM with supper.  Thanks, Orlando PennerMarie Brinton Brandel, RN, MSN, CDE Diabetes Coordinator Inpatient Diabetes Program [phone] (Team Pager from 8am to 5pm) [phone] ([facility] office) [phone] [facility] office) [phone] [facility] office)

## 2015-10-23 NOTE — Discharge Summary (Signed)
Physician Discharge Summary  [name] ZOX:[id number] DOB: 1945/10/09 DOA: 10/21/2015  PCP: Lupe Carney, MD  Admit date: 10/21/2015 Discharge date: 10/23/2015  Time spent: 35 minutes  Recommendations for Outpatient Follow-up:  1. PCP Hospitalization follow-up Visit   Discharge Diagnoses:  Principal Problem:   Delirium Active Problems:   Diabetes mellitus, type 2 (HCC)   H/O: CVA (cerebrovascular accident)   Hypertension   UTI (urinary tract infection)   Hyponatremia   Hypokalemia   AKI (acute kidney injury) (HCC)   Discharge Condition: Stable  Diet recommendation: Diabetic heart healthy  Filed Weights   10/21/15 1327 10/21/15 2223  Weight: 68 kg (150 lb) 69.7 kg (153 lb 10.6 oz)    History of present illness:  KAILE BIXLER is a 70 y.o. female with medical history significant of afib (not on anticoagulation), CVA.  Patient is unaccompanied at this time.  She reports that she had fallen, doesn't remember when - difficulty moving arms and legs and thought maybe she had hurt herself. "Had a bad memory spell and my memory is still fuzzy."  Was forgetting to check her sugars and take her insulin.  Unsure how long this has been going on.  "IIt's coming back and it's good."  Husband reported hallucinations - she says "now, that's my husband talking."  Strange noises make her uncomfortable and she has been anxious that someone is in the garage or at the door; husband couldn't hear it and when he would check no one would be there.    Does c/o significant dysuria.  Note from Dr. Verdie Mosher indicates that symptoms started 2 days after starting hydroxyzine, but stopping the medication 3 days ago did not resolve the symptoms.  Hospital Course:  Patient brought in for monitoring due to altered mental status. Started on Cipro for UTI. Close monitoring of blood sugar due to diabetes. Medications that are histamine blockers were stop. Overtime patients mental status improved. Patient  discharged home with her husband. Urine cultures grew back multiple species.  Procedures:  n/a  Consultations:  n/a  Discharge Exam: Vitals:   10/22/15 2110 10/23/15 0608  BP: (!) 158/56 (!) 141/62  Pulse: 73 86  Resp: 16 17  Temp: 98 F (36.7 C) 97.8 F (36.6 C)     General:  No diaphoresis, anxious, no acute distress  Cardiovascular: Regular rate and rhythm no murmurs rubs or gallops  Respiratory: Clear to auscultation bilaterally no more breathing  Abdomen: Nondistended bowel sounds normal nontender palpation  Musculoskeletal: Moving all extremities, no deformity, 5 out of 5 strength   Neuro: A&Ox2, not time, hallucinating   Discharge Instructions    Discharge Medication List as of 10/23/2015  1:35 PM    CONTINUE these medications which have CHANGED   Details  acetaminophen (TYLENOL) 325 MG tablet Take 2 tablets (650 mg total) by mouth every 6 (six) hours as needed for mild pain (or Fever >/= 101)., Starting Fri 10/23/2015, Normal    ciprofloxacin (CIPRO) 500 MG tablet Take 1 tablet (500 mg total) by mouth 2 (two) times daily., Starting Fri 10/23/2015, Until Mon 11/02/2015, Normal    ondansetron (ZOFRAN) 4 MG tablet Take 1 tablet (4 mg total) by mouth every 6 (six) hours as needed for nausea., Starting Fri 10/23/2015, Normal      CONTINUE these medications which have NOT CHANGED   Details  aspirin 81 MG tablet Take 81 mg by mouth daily.  , Historical Med    atorvastatin (LIPITOR) 20 MG tablet Take  20 mg by mouth daily.  , Historical Med    BD PEN NEEDLE NANO U/F 32G X 4 MM MISC Use as directed for Insulin Injections, Starting Fri 07/01/2011, Historical Med    chlorhexidine (PERIDEX) 0.12 % solution Use as directed 5 mLs in the mouth or throat 2 (two) times daily as needed. AS DIRECTED ON BOTTLE, Starting Tue 08/12/2014, Historical Med    cycloSPORINE (RESTASIS) 0.05 % ophthalmic emulsion Place 1 drop into both eyes 2 (two) times daily., Historical Med     diltiazem (CARDIZEM CD) 120 MG 24 hr capsule Take 120 mg by mouth daily., Historical Med    DULoxetine (CYMBALTA) 30 MG capsule Take 90 mg by mouth daily. , Historical Med    EPINEPHrine 0.3 mg/0.3 mL IJ SOAJ injection Inject 0.3 mg into the muscle as needed (FOR ALLERGIES)., Historical Med    hydrochlorothiazide (HYDRODIURIL) 25 MG tablet Take 0.5 tablets (12.5 mg total) by mouth daily., Starting Mon 04/27/2015, Normal    NOVOLOG MIX 70/30 FLEXPEN (70-30) 100 UNIT/ML FlexPen Inject 75 Units into the skin 2 (two) times daily., Starting Mon 09/07/2015, Historical Med    Omega-3 Fatty Acids (FISH OIL) 1200 MG CAPS Take 2 capsules by mouth daily.  , Historical Med    omeprazole-sodium bicarbonate (ZEGERID) 40-1100 MG per capsule Take 1 capsule by mouth at bedtime.  , Historical Med    traZODone (DESYREL) 50 MG tablet Take 25 mg by mouth at bedtime., Historical Med    nebivolol (BYSTOLIC) 10 MG tablet Take 1 tablet (10 mg total) by mouth 2 (two) times daily. Please call and schedule an appointment with Dr Katrinka BlazingSmith, Starting Tue 08/18/2015, Normal      STOP taking these medications     hydrOXYzine (VISTARIL) 25 MG capsule      loratadine (CLARITIN) 10 MG tablet        Allergies  Allergen Reactions  . Cephalexin Swelling    Tongue and lip swelling  . Penicillins Nausea And Vomiting and Swelling    Oral Cephalosporins cause Tongue and Lip Swelling  . Atenolol     Light headed/hair loss  . Chromic Sulfate     Chromic sutures  . Codeine Nausea Only  . Desmopressin   . Dexilant [Dexlansoprazole] Diarrhea  . Doxycycline Nausea And Vomiting  . Entex     REACTION: unknown  . Farxiga [Dapagliflozin]   . Gabapentin     REACTION: unknown  . Levaquin [Levofloxacin] Other (See Comments)    GI upset  . Lexapro [Escitalopram]     Weight gain  . Lisinopril   . Losartan Potassium   . Metformin     REACTION: unknown  . Other     Bellaryl  . Pregabalin     REACTION: unknown  .  Rosiglitazone Maleate     REACTION: unknown  . Simvastatin     REACTION: unknown  . Sitagliptin Phosphate     REACTION: unknown  . Tradjenta [Linagliptin] Other (See Comments)    Joint pain   Follow-up Information    Lupe Carneyean Mitchell, MD Follow up in 1 week(s).   Specialty:  Family Medicine Why:  See for hospital follow-up Discusse UTI, confusion, acute kidney injury Discuss restarting vistaril and claritin  Contact information: [address] [place] [address] [phone]            The results of significant diagnostics from this hospitalization (including imaging, microbiology, ancillary and laboratory) are listed below for reference.    Significant  Diagnostic Studies: Dg Chest 2 View  Result Date: 10/21/2015 CLINICAL DATA:  Larey SeatFell out of bed and unable to use arms or legs. EXAM: CHEST  2 VIEW COMPARISON:  10/20/2010 FINDINGS: The lungs are clear wiithout focal pneumonia, edema, pneumothorax or pleural effusion. The cardiopericardial silhouette is within normal limits for size. The visualized bony structures of the thorax are intact. Left pacer remains in place. IMPRESSION: No active cardiopulmonary disease. Electronically Signed   By: Kennith CenterEric  Mansell M.D.   On: 10/21/2015 18:11   Ct Head Wo Contrast  Result Date: 10/21/2015 CLINICAL DATA:  Initial evaluation for acute altered mental status. Delusions, memory loss. EXAM: CT HEAD WITHOUT CONTRAST TECHNIQUE: Contiguous axial images were obtained from the base of the skull through the vertex without intravenous contrast. COMPARISON:  Prior CT from 01/19/2010. FINDINGS: Scalp soft tissues demonstrate no acute abnormality. No acute abnormality about the globes and orbits. Paranasal sinuses are clear.  No mastoid effusion. Calvarium intact. Diffuse prominence of the CSF containing spaces is compatible with generalized cerebral atrophy. Chronic microvascular ischemic changes present within the periventricular and deep  white matter both cerebral hemispheres. No acute intracranial hemorrhage or evidence for large vessel territory infarct. No mass lesion, midline shift, or mass effect. Ventricular prominence related to global parenchymal volume loss without hydrocephalus. No extra-axial fluid collection. IMPRESSION: 1. No acute intracranial process identified. 2. Generalized cerebral atrophy with mild chronic microvascular ischemic disease. Electronically Signed   By: Rise MuBenjamin  McClintock M.D.   On: 10/21/2015 17:56    Microbiology: Recent Results (from the past 240 hour(s))  Urine culture     Status: Abnormal   Collection Time: 10/21/15  9:22 PM  Result Value Ref Range Status   Specimen Description URINE, RANDOM  Final   Special Requests NONE  Final   Culture MULTIPLE SPECIES PRESENT, SUGGEST RECOLLECTION (A)  Final   Report Status 10/23/2015 FINAL  Final     Labs: Basic Metabolic Panel:  Recent Labs Lab 10/21/15 1822 10/22/15 0217 10/23/15 0509  NA 129* 132* 138  K 3.2* 3.0* 4.3  CL 90* 98* 103  CO2 27 25 26   GLUCOSE 554* 359* 76  BUN 20 13 10   CREATININE 1.21* 0.87 0.78  CALCIUM 9.4 8.7* 9.4  MG  --  1.4* 1.8   Liver Function Tests:  Recent Labs Lab 10/21/15 1822  AST 29  ALT 26  ALKPHOS 96  BILITOT 0.7  PROT 6.6  ALBUMIN 3.7   No results for input(s): LIPASE, AMYLASE in the last 168 hours. No results for input(s): AMMONIA in the last 168 hours. CBC:  Recent Labs Lab 10/21/15 1822 10/22/15 0217 10/23/15 0509  WBC 8.0 5.7 6.8  NEUTROABS 3.7  --  2.0  HGB 14.0 12.0 13.4  HCT 40.5 34.8* 39.3  MCV 81.7 81.7 81.9  PLT 203 146* 177   Cardiac Enzymes: No results for input(s): CKTOTAL, CKMB, CKMBINDEX, TROPONINI in the last 168 hours. BNP: BNP (last 3 results) No results for input(s): BNP in the last 8760 hours.  ProBNP (last 3 results) No results for input(s): PROBNP in the last 8760 hours.  CBG:  Recent Labs Lab 10/22/15 1118 10/22/15 1713 10/22/15 2108  10/23/15 0721 10/23/15 1107  GLUCAP 267* 209* 188* 126* 118*       Signed:  Haydee SalterPhillip M Zyanna Leisinger MD  FACP  [facility] 10/23/2015, 1:02 PM

## 2015-10-23 NOTE — Discharge Instructions (Signed)
Delirium Delirium is a state of mental confusion. It comes on quickly and causes significant changes in a person's thinking and behavior. People with delirium usually have trouble paying attention to what is going on or knowing where they are. They may become very withdrawn or very emotional and unable to sit still. They may even see or feel things that are not there (hallucinations). Delirium is a sign of a serious underlying medical condition. CAUSES Delirium occurs when something suddenly affects the signals that the brain sends out. Brain signals can be affected by anything that puts severe stress on the body and brain and causes brain chemicals to be out of balance. The most common causes of delirium include:  Infections. These may be bacterial, viral, fungal, or protozoal.  Medicines. These include many over-the-counter and prescription medicines.  Recreational drugs.  Substance withdrawal. This occurs with sudden discontinuation of alcohol, certain medicines, or recreational drugs.  Surgery.  Sudden vascular events, such as stroke, brain hemorrhage, and severe migraine.  Other brain disorders, such as tumors, seizures, and physical head trauma.  Metabolic disorders, such as kidney or liver failure.  Low blood oxygen (anoxia). This may occur with lung disease, cardiac arrest, or carbon monoxide poisoning.  Hormone imbalances (endocrinopathies), such as an overactive thyroid (hyperthyroidism) or underactive thyroid (hypothyroidism).  Vitamin deficiencies. RISK FACTORS This condition is more likely to develop in:  Children.  Older people.  People who live alone.  People who have vision loss or hearing loss.  People who have existing brain disease, such as dementia.  People who have long-lasting (chronic) medical conditions, such as heart disease.  People who are hospitalized for long periods of time. SYMPTOMS Delirium starts with a sudden change in a person's thinking  or behavior. Symptoms come and go (fluctuate) over time, and they are often worse at the end of the day. Symptoms include:  Not being able to stay awake (drowsiness) or pay attention.  Being confused about places, time, and people.  Forgetfulness.  Having extreme energy levels. These may be low or high.  Changes in sleep patterns.  Extreme mood swings, such as anger or anxiety.  Focusing on things or ideas that are not important.  Rambling and senseless talking.  Difficulty speaking, understanding speech, or both.  Hallucinations.  Tremor or unsteady gait. DIAGNOSIS People with delirium may not realize that they have the condition. Often, a family member or health care provider is the first person to notice the changes. The health care provider will obtain a detailed history of current symptoms, medical issues, medicines, and recreational drug use. The health care provider will perform a mental status examination by:  Asking questions to check for confusion.  Watching for abnormal behavior. The health care provider may perform a physical exam and order lab tests or additional studies to determine the cause of the delirium. TREATMENT Treatment of delirium depends on the cause and severity. Delirium usually goes away within days or weeks of treating the underlying cause. In the meantime, the person should not be left alone because he or she may accidentally cause self-harm. Treatment includes supportive care, such as:  Increased light during the day and decreased light at night.  Low noise level.  Uninterrupted sleep.  A regular daily schedule.  Clocks and calendars to help with orientation.  Familiar objects, including the person's pictures and clothing.  Frequent visits from familiar family and friends.  Healthy diet.  Exercise. In more severe cases of delirium, medicine may be prescribed  to help the person to keep calm and think more clearly. HOME CARE  INSTRUCTIONS  Any supportive care should be continued as told by the health care provider.  All medicines should be used as told by the health care provider. This is important.  The health care provider should be consulted before over-the-counter medicines, herbs, or supplements are used.  All follow-up visits should be kept as told by the health care provider. This is important.  Alcohol and recreational drugs should be avoided as told by the health care provider. SEEK MEDICAL CARE IF:  Symptoms do not get better or they become worse.  New symptoms of delirium develop.  Caring for the person at home does not seem safe.  Eating, drinking, or communicating stops.  There are side effects of medicines, such as changes in sleep patterns, dizziness, weight gain, restlessness, movement changes, or tremors. SEEK IMMEDIATE MEDICAL CARE IF:  Serious thoughts occur about self-harm or about hurting others.  There are serious side effects of medicine, such as:  Swelling of the face, lips, tongue, or throat.  Fever, confusion, muscle spasms, or seizures.   This information is not intended to replace advice given to you by your health care provider. Make sure you discuss any questions you have with your health care provider.   Document Released: 11/16/2011 Document Revised: 07/08/2014 Document Reviewed: 04/16/2014 Elsevier Interactive Patient Education 2016 Elsevier Inc.   Urinary Tract Infection Urinary tract infections (UTIs) can develop anywhere along your urinary tract. Your urinary tract is your body's drainage system for removing wastes and extra water. Your urinary tract includes two kidneys, two ureters, a bladder, and a urethra. Your kidneys are a pair of bean-shaped organs. Each kidney is about the size of your fist. They are located below your ribs, one on each side of your spine. CAUSES Infections are caused by microbes, which are microscopic organisms, including fungi,  viruses, and bacteria. These organisms are so small that they can only be seen through a microscope. Bacteria are the microbes that most commonly cause UTIs. SYMPTOMS  Symptoms of UTIs may vary by age and gender of the patient and by the location of the infection. Symptoms in young women typically include a frequent and intense urge to urinate and a painful, burning feeling in the bladder or urethra during urination. Older women and men are more likely to be tired, shaky, and weak and have muscle aches and abdominal pain. A fever may mean the infection is in your kidneys. Other symptoms of a kidney infection include pain in your back or sides below the ribs, nausea, and vomiting. DIAGNOSIS To diagnose a UTI, your caregiver will ask you about your symptoms. Your caregiver will also ask you to provide a urine sample. The urine sample will be tested for bacteria and white blood cells. White blood cells are made by your body to help fight infection. TREATMENT  Typically, UTIs can be treated with medication. Because most UTIs are caused by a bacterial infection, they usually can be treated with the use of antibiotics. The choice of antibiotic and length of treatment depend on your symptoms and the type of bacteria causing your infection. HOME CARE INSTRUCTIONS  If you were prescribed antibiotics, take them exactly as your caregiver instructs you. Finish the medication even if you feel better after you have only taken some of the medication.  Drink enough water and fluids to keep your urine clear or pale yellow.  Avoid caffeine, tea, and carbonated beverages. They  tend to irritate your bladder.  Empty your bladder often. Avoid holding urine for long periods of time.  Empty your bladder before and after sexual intercourse.  After a bowel movement, women should cleanse from front to back. Use each tissue only once. SEEK MEDICAL CARE IF:   You have back pain.  You develop a fever.  Your symptoms do  not begin to resolve within 3 days. SEEK IMMEDIATE MEDICAL CARE IF:   You have severe back pain or lower abdominal pain.  You develop chills.  You have nausea or vomiting.  You have continued burning or discomfort with urination. MAKE SURE YOU:   Understand these instructions.  Will watch your condition.  Will get help right away if you are not doing well or get worse.   This information is not intended to replace advice given to you by your health care provider. Make sure you discuss any questions you have with your health care provider.   Document Released: 12/01/2004 Document Revised: 11/12/2014 Document Reviewed: 04/01/2011 Elsevier Interactive Patient Education [employer] Elsevier Inc.

## 2015-10-23 NOTE — Progress Notes (Signed)
Patient discharged to home with instructions. 

## 2015-10-26 DIAGNOSIS — R351 Nocturia: Secondary | ICD-10-CM | POA: Diagnosis not present

## 2015-10-26 DIAGNOSIS — N3944 Nocturnal enuresis: Secondary | ICD-10-CM | POA: Diagnosis not present

## 2015-10-26 DIAGNOSIS — R350 Frequency of micturition: Secondary | ICD-10-CM | POA: Diagnosis not present

## 2015-10-26 DIAGNOSIS — N3010 Interstitial cystitis (chronic) without hematuria: Secondary | ICD-10-CM | POA: Diagnosis not present

## 2015-10-26 DIAGNOSIS — N3946 Mixed incontinence: Secondary | ICD-10-CM | POA: Diagnosis not present

## 2015-10-29 ENCOUNTER — Telehealth: Payer: Self-pay | Admitting: Cardiology

## 2015-10-29 ENCOUNTER — Ambulatory Visit (INDEPENDENT_AMBULATORY_CARE_PROVIDER_SITE_OTHER): Payer: Medicare Other | Admitting: *Deleted

## 2015-10-29 DIAGNOSIS — I495 Sick sinus syndrome: Secondary | ICD-10-CM | POA: Diagnosis not present

## 2015-10-29 NOTE — Progress Notes (Signed)
Remote pacemaker transmission.   

## 2015-10-29 NOTE — Telephone Encounter (Signed)
Spoke with pt and reminded pt of remote transmission that is due today. Pt verbalized understanding.   

## 2015-11-01 ENCOUNTER — Other Ambulatory Visit: Payer: Self-pay | Admitting: Interventional Cardiology

## 2015-11-05 ENCOUNTER — Encounter: Payer: Self-pay | Admitting: Cardiology

## 2015-11-11 LAB — CUP PACEART REMOTE DEVICE CHECK
Battery Impedance: 1415 Ohm
Battery Remaining Longevity: 34 mo
Brady Statistic AP VS Percent: 0 %
Brady Statistic AS VS Percent: 0 %
Implantable Lead Implant Date: 20081113
Implantable Lead Implant Date: 20081113
Implantable Lead Location: 753860
Lead Channel Impedance Value: 557 Ohm
Lead Channel Impedance Value: 729 Ohm
Lead Channel Pacing Threshold Amplitude: 0.625 V
Lead Channel Pacing Threshold Pulse Width: 0.4 ms
Lead Channel Pacing Threshold Pulse Width: 0.4 ms
Lead Channel Setting Pacing Amplitude: 2 V
Lead Channel Setting Pacing Pulse Width: 0.4 ms
Lead Channel Setting Sensing Sensitivity: 4 mV
MDC IDC LEAD LOCATION: 753859
MDC IDC MSMT BATTERY VOLTAGE: 2.73 V
MDC IDC MSMT LEADCHNL RA PACING THRESHOLD AMPLITUDE: 0.5 V
MDC IDC MSMT LEADCHNL RA SENSING INTR AMPL: 2.8 mV
MDC IDC SESS DTM: 20170824121745
MDC IDC SET LEADCHNL RV PACING AMPLITUDE: 2.5 V
MDC IDC STAT BRADY AP VP PERCENT: 41 %
MDC IDC STAT BRADY AS VP PERCENT: 59 %

## 2015-11-12 DIAGNOSIS — M064 Inflammatory polyarthropathy: Secondary | ICD-10-CM | POA: Diagnosis not present

## 2015-11-12 DIAGNOSIS — M797 Fibromyalgia: Secondary | ICD-10-CM | POA: Diagnosis not present

## 2015-11-12 DIAGNOSIS — M1990 Unspecified osteoarthritis, unspecified site: Secondary | ICD-10-CM | POA: Diagnosis not present

## 2015-11-12 DIAGNOSIS — M542 Cervicalgia: Secondary | ICD-10-CM | POA: Diagnosis not present

## 2015-12-02 DIAGNOSIS — M3500 Sicca syndrome, unspecified: Secondary | ICD-10-CM | POA: Diagnosis not present

## 2015-12-04 DIAGNOSIS — K117 Disturbances of salivary secretion: Secondary | ICD-10-CM | POA: Diagnosis not present

## 2015-12-04 DIAGNOSIS — M3500 Sicca syndrome, unspecified: Secondary | ICD-10-CM | POA: Diagnosis not present

## 2015-12-09 DIAGNOSIS — E7800 Pure hypercholesterolemia, unspecified: Secondary | ICD-10-CM | POA: Diagnosis not present

## 2015-12-09 DIAGNOSIS — E1165 Type 2 diabetes mellitus with hyperglycemia: Secondary | ICD-10-CM | POA: Diagnosis not present

## 2015-12-09 DIAGNOSIS — Z23 Encounter for immunization: Secondary | ICD-10-CM | POA: Diagnosis not present

## 2015-12-09 DIAGNOSIS — I10 Essential (primary) hypertension: Secondary | ICD-10-CM | POA: Diagnosis not present

## 2015-12-28 DIAGNOSIS — R21 Rash and other nonspecific skin eruption: Secondary | ICD-10-CM | POA: Diagnosis not present

## 2015-12-28 DIAGNOSIS — M797 Fibromyalgia: Secondary | ICD-10-CM | POA: Diagnosis not present

## 2015-12-28 DIAGNOSIS — M1990 Unspecified osteoarthritis, unspecified site: Secondary | ICD-10-CM | POA: Diagnosis not present

## 2015-12-28 DIAGNOSIS — R768 Other specified abnormal immunological findings in serum: Secondary | ICD-10-CM | POA: Diagnosis not present

## 2015-12-30 DIAGNOSIS — M47812 Spondylosis without myelopathy or radiculopathy, cervical region: Secondary | ICD-10-CM | POA: Diagnosis not present

## 2015-12-30 DIAGNOSIS — M79621 Pain in right upper arm: Secondary | ICD-10-CM | POA: Diagnosis not present

## 2016-01-18 ENCOUNTER — Encounter: Payer: Self-pay | Admitting: Physician Assistant

## 2016-01-20 ENCOUNTER — Ambulatory Visit (INDEPENDENT_AMBULATORY_CARE_PROVIDER_SITE_OTHER): Payer: Medicare Other

## 2016-01-20 ENCOUNTER — Ambulatory Visit (INDEPENDENT_AMBULATORY_CARE_PROVIDER_SITE_OTHER): Payer: Medicare Other | Admitting: Orthopaedic Surgery

## 2016-01-20 ENCOUNTER — Encounter (INDEPENDENT_AMBULATORY_CARE_PROVIDER_SITE_OTHER): Payer: Self-pay | Admitting: Orthopaedic Surgery

## 2016-01-20 ENCOUNTER — Encounter: Payer: Self-pay | Admitting: Surgery

## 2016-01-20 VITALS — BP 178/70 | HR 65 | Ht 63.0 in | Wt 154.0 lb

## 2016-01-20 DIAGNOSIS — M25512 Pain in left shoulder: Secondary | ICD-10-CM

## 2016-01-20 DIAGNOSIS — M25511 Pain in right shoulder: Secondary | ICD-10-CM

## 2016-01-20 DIAGNOSIS — M4722 Other spondylosis with radiculopathy, cervical region: Secondary | ICD-10-CM | POA: Diagnosis not present

## 2016-01-20 DIAGNOSIS — I6523 Occlusion and stenosis of bilateral carotid arteries: Secondary | ICD-10-CM

## 2016-01-20 DIAGNOSIS — G8929 Other chronic pain: Secondary | ICD-10-CM

## 2016-01-20 NOTE — Progress Notes (Signed)
Office Visit Note   Patient: [name]           Date of Birth: 1945/08/06           MRN: [id number] Visit Date: 01/20/2016              Requested by: [name] SaltGary Kuzma, MD [address] [place], [place] [id number] PCP: [name] Carneyean Mitchell, MD   Assessment & Plan: Visit Diagnoses:  1. Chronic left shoulder pain   2. Chronic right shoulder pain   3. Other spondylosis with radiculopathy, cervical region   Headaches with balance issues. History of carotid artery disease. Status post  left carotid endarterectomy by [name] . Plan: We'll schedule patient for CT cervical spine to rule out HNP/stenosis due to her ongoing neck pain and bilateral upper extremity radiculopathy. She'll follow up with [name] after completion to discuss results and further treatment options. At follow-up visit we may consider injecting patient shoulders today upon what is found on her CT scan. With her complaints of balance issues and headaches that she has recommend that she follow-up with vascular surgery to see if any workup is needed there. She has also seen a neurologist and she may end up needing referral back to them as well.  Follow-Up Instructions: No Follow-up on file.   Orders:  Orders Placed This Encounter  Procedures  . XR Shoulder Right  . XR Shoulder Left   No orders of the defined types were placed in this encounter.     Procedures: No procedures performed   Clinical Data: No additional findings.   Subjective: Chief Complaint  Patient presents with  . Neck - Pain    Patient is here as a referral by Dr. Merlyn [name] for cervical spondylosis. She has been having pain in her neck, down into her shoulders, and she is now having headaches.  She states that she feels pain in the tops of her arms when reaching into her cabinet.  She is currently on steroids and Placquenel. She states that is really for her leg. She had recent surgery on her left carotid in August  2016.  She states that it was  twisted and an abscess, it was not blocked.  She states that she does have a history of arthritis and fibromyalgia.  Patient states that she's had symptoms for a while. Bilateral shoulder pain aggravated with overhead activity and reaching behind her back. [name] has seen her for left shoulder problems in the past. She also has known history of cervical spondylosis.  Patient admits to getting these episodes of sharp pain also in her head. Has history of bilateral carotid artery stenosis and had left carotid endarterectomy 2016. Patient last seen by vascular surgery June 2017.  Review of Systems  Constitutional: Positive for activity change.  HENT: Negative.   Respiratory: Negative.   Cardiovascular: Positive for palpitations.  Gastrointestinal: Negative.   Musculoskeletal: Positive for gait problem and neck pain.  Neurological: Positive for dizziness, numbness and headaches.  Psychiatric/Behavioral: Negative.      Objective: Vital Signs: BP (!) 178/70   Pulse 65   Ht 5\' 3"  (1.6 m)   Wt 154 lb (69.9 kg)   BMI 27.28 kg/m   Physical Exam  Constitutional: No distress.  HENT:  Head: Normocephalic and atraumatic.  Eyes: Pupils are equal, round, and reactive to light.  Pulmonary/Chest: No respiratory distress.  Abdominal: She exhibits no distension.  Musculoskeletal:  Bilateral shoulders show positive impingement test. Negative drop arm. Bilateral  trapezius and scapular tenderness. No focal motor deficits.  Neurological: She is alert.  Skin: Skin is warm and dry.    Ortho Exam  Specialty Comments:  No specialty comments available.  Imaging: No results found.   PMFS History: Patient Active Problem List   Diagnosis Date Noted  . UTI (urinary tract infection) 10/22/2015  . Hyponatremia 10/22/2015  . Hypokalemia 10/22/2015  . AKI (acute kidney injury) (HCC) 10/22/2015  . Delirium 10/21/2015  . Asymptomatic carotid artery stenosis 10/31/2014  . Left carotid bruit  08/26/2013  . Palpitations 06/24/2013  . 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 Hypertension   . Itching 01/03/2013  . GERD (gastroesophageal reflux disease) 11/23/2011  . PULMONARY EMBOLISM, hx of 12/22/2008  . RHINOCONJUNCTIVITIS, ALLERGIC 12/20/2007  . SACROILIAC JOINT DYSFUNCTION 09/28/2007  . Depressive disorder, not elsewhere classified 08/28/2007  . KNEE PAIN, RIGHT 08/28/2007  . PAIN IN JOINT OTHER SPECIFIED SITES 08/28/2007  . 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 CARDIAC PACEMAKER IN SITU 06/06/2007  . Diabetes mellitus, type 2 (HCC) 05/16/2007  . ANXIETY 05/16/2007  . Obstructive sleep apnea 05/16/2007  . H/O: CVA (cerebrovascular accident) 05/16/2007  . BRONCHITIS 05/16/2007  . FIBROMYALGIA 05/16/2007  . FACIAL PAIN 05/16/2007  . DYSPHONIA 05/16/2007  . OTHER DYSPHAGIA 05/16/2007   Past Medical History:  Diagnosis Date  . Allergic rhinitis, cause unspecified   . Anxiety state, unspecified   . Arthritis   . Atrial fibrillation (HCC)   . Bronchitis, not specified as acute or chronic   . Cardiac pacemaker medtronic   . 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 Depressive disorder, not elsewhere classified   . Disorders of sacrum   . GERD (gastroesophageal reflux disease)   . Headache(784.0)    history of  . Heart murmur   . History of hiatal hernia   . Hypertension   . LBBB (left bundle branch block)   . Myalgia and myositis, unspecified   . Obstructive sleep apnea (adult) (pediatric)   . Other voice and resonance disorders   . Pain in joint, lower leg    bilateral  . Presence of permanent cardiac pacemaker   . Pulmonary embolism (HCC)   . Rash, skin 2 weeks per patient   right arm, inner aspect    . Rheumatic fever in pediatric patient   . Total knee replacement status   . Type II or unspecified type diabetes mellitus without mention of complication, not stated as uncontrolled    Type II  . Unspecified  asthma(493.90)   . 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 Vertigo    Meclizine (Antivert)    Family History  Problem Relation Age of Onset  . Cancer Father     LUNG  . Alcohol abuse Father   . Diabetes Mother   . Heart disease Mother   . Stroke Mother   . Heart attack Mother   . Hypertension Mother   . Deep vein thrombosis Mother   . Hyperlipidemia Mother   . Varicose Veins Mother   . Peripheral vascular disease Mother     amputation  . Lupus Sister   . Diabetes Sister   . Aneurysm Sister   . Deep vein thrombosis Sister   . Hyperlipidemia Sister   . Hypertension Sister   . Varicose Veins Sister   . Heart disease Sister   . Deep vein thrombosis Sister   . Diabetes Sister   .  Hyperlipidemia Sister   . Hypertension Sister   . Varicose Veins Sister     Past Surgical History:  Procedure Laterality Date  . BALLOON DILATION N/A 06/26/2013   Procedure: BALLOON DILATION;  Surgeon: Vertell Novak., MD;  Location: [facility];  Service: Endoscopy;  Laterality: N/A;  . BILATERAL SALPINGOOPHORECTOMY  1991  . CHOLECYSTECTOMY    . COLONOSCOPY N/A 06/26/2013   Procedure: COLONOSCOPY;  Surgeon: Vertell Novak., MD;  Location: [facility];  Service: Endoscopy;  Laterality: N/A;  . ELBOW SURGERY Bilateral   . ENDARTERECTOMY Left 10/31/2014   Procedure: LEFT CAROTID ENDARTERECTOMY WITH BOVINE PERICARDIAL PATCH ANGIOPLASTY;  Surgeon: Nada Libman, MD;  Location: [facility];  Service: Vascular;  Laterality: Left;  . ESOPHAGOGASTRODUODENOSCOPY  11/02/2011   Procedure: ESOPHAGOGASTRODUODENOSCOPY (EGD);  Surgeon: Vertell Novak., MD;  Location: [facility];  Service: Endoscopy;  Laterality: N/A;  with xray  . ESOPHAGOGASTRODUODENOSCOPY N/A 06/26/2013   Procedure: ESOPHAGOGASTRODUODENOSCOPY (EGD);  Surgeon: Vertell Novak., MD;  Location: [facility];  Service: Endoscopy;  Laterality: N/A;  . INSERT / REPLACE / REMOVE  PACEMAKER     insertion 2007  . NASAL FRACTURE SURGERY    . SAVORY DILATION  11/02/2011   Procedure: SAVORY DILATION;  Surgeon: Vertell Novak., MD;  Location: [facility];  Service: Endoscopy;  Laterality: N/A;  . TEMPOROMANDIBULAR JOINT ARTHROPLASTY  1982  . TOTAL KNEE ARTHROPLASTY  2001, 2010, 2012   right  . total knee repair  2010   right  . VESICOVAGINAL FISTULA CLOSURE W/ TAH  1974   Social History   Occupational History  . Not on file.   Social History Main Topics  . Smoking status: Former Smoker    Packs/day: 0.30    Years: 20.00    Types: Cigarettes    Quit date: 2007  . Smokeless tobacco: Never Used     Comment: pt smoked less than 1/4 ppd  . Alcohol use No  . Drug use: No     Comment: "now, marijuana I would do..." - but doesn't by report  . Sexual activity: Not on file

## 2016-01-27 ENCOUNTER — Encounter: Payer: Self-pay | Admitting: Surgery

## 2016-02-01 ENCOUNTER — Ambulatory Visit (INDEPENDENT_AMBULATORY_CARE_PROVIDER_SITE_OTHER): Payer: Medicare Other | Admitting: Surgery

## 2016-02-01 ENCOUNTER — Encounter: Payer: Self-pay | Admitting: Surgery

## 2016-02-01 ENCOUNTER — Encounter: Payer: Medicare Other | Admitting: Physician Assistant

## 2016-02-01 VITALS — BP 177/80 | HR 68 | Temp 98.3°F | Resp 18 | Ht 63.0 in | Wt 158.1 lb

## 2016-02-01 DIAGNOSIS — I6523 Occlusion and stenosis of bilateral carotid arteries: Secondary | ICD-10-CM | POA: Diagnosis not present

## 2016-02-01 DIAGNOSIS — I6521 Occlusion and stenosis of right carotid artery: Secondary | ICD-10-CM | POA: Diagnosis not present

## 2016-02-01 NOTE — Progress Notes (Signed)
Cardiology Office Note Date:  02/02/2016  Patient ID:  [name], DOB 1946/02/16, MRN [id number] PCP:  Lupe Carneyean Mitchell, MD  Cardiologist:  Dr. Katrinka BlazingSmith Electrophysiologist: Dr. Graciela HusbandsKlein   Chief Complaint:  planned visit  History of Present Illness: [name] is a 70 y.o. 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  She comes in today to be seen for Dr. Graciela HusbandsKlein.  Last seen by him Feb 2017, at that time doing well without changes from EP standpoint, no changes were made. More recently hospitalized in Aug with acute delirium felt secondary to UTI and hypoglycemia.  She saw vascular yesterday with c/o months of dizziness with imaging planned.  She is feeling "OK".  She denies any kind of symptoms, though when asked about the c/o dizziness to vascular, she states this is a longstanding and ongoing issue. With a feeling of being off balance, and not new.  She reports numerous falls without injury, States she has known C-spine spurs/issues and her orthopedic doctor thinks this is the issue though is pending tests via Dr. Dahlia ByesBrabam tomorrow.  She denies any near syncope or syncope, no CP, palpitations or SOB.  She recalls some years ago a diagnosis of AFib, was taken off warfarin she thought treatment had been completed and was secondary to the PE.  Historical cardiac testing: Dr. Odessa FlemingKlein's note reported: moderate-mild diffuse disease of her LAD; this was identified a catheterization 2005. Myoview scan 2008 demonstrated no ischemia.   Device information: MDT dual chamber PPM, implanted 01/18/07,  Dr. Graciela HusbandsKlein   Past Medical History:  Diagnosis Date  . Allergic rhinitis, cause unspecified   . Anxiety state, unspecified   . Arthritis   . Atrial fibrillation (HCC)   . Bronchitis, not specified as acute or chronic   . Cardiac pacemaker medtronic   . 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  Depressive disorder, not elsewhere classified   . Disorders of sacrum   . GERD (gastroesophageal reflux disease)   . Headache(784.0)    history of  . Heart murmur   . History of hiatal hernia   . Hypertension   . LBBB (left bundle branch block)   . Myalgia and myositis, unspecified   . Obstructive sleep apnea (adult) (pediatric)   . Other voice and resonance disorders   . Pain in joint, lower leg    bilateral  . Presence of permanent cardiac pacemaker   . Pulmonary embolism (HCC)   . Rash, skin 2 weeks per patient   right arm, inner aspect    . Rheumatic fever in pediatric patient   . Total knee replacement status   . Type II or unspecified type diabetes mellitus without mention of complication, not stated as uncontrolled    Type II  . Unspecified asthma(493.90)   . 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 Vertigo    Meclizine (Antivert)    Past Surgical History:  Procedure Laterality Date  . BALLOON DILATION N/A 06/26/2013   Procedure: BALLOON DILATION;  Surgeon: Vertell NovakJames L Edwards Jr., MD;  [facility];  Service: Endoscopy;  Laterality: N/A;  . BILATERAL SALPINGOOPHORECTOMY  1991  . CHOLECYSTECTOMY    . COLONOSCOPY N/A 06/26/2013   Procedure: COLONOSCOPY;  Surgeon: Vertell NovakJames L Edwards Jr., MD;  [facility];  Service: Endoscopy;  Laterality: N/A;  . ELBOW  SURGERY Bilateral   . ENDARTERECTOMY Left 10/31/2014   Procedure: LEFT CAROTID ENDARTERECTOMY WITH BOVINE PERICARDIAL PATCH ANGIOPLASTY;  Surgeon: Nada Libman, MD;  Location: [facility];  Service: Vascular;  Laterality: Left;  . ESOPHAGOGASTRODUODENOSCOPY  11/02/2011   Procedure: ESOPHAGOGASTRODUODENOSCOPY (EGD);  Surgeon: Vertell Novak., MD;  Location: [facility];  Service: Endoscopy;  Laterality: N/A;  with xray  . ESOPHAGOGASTRODUODENOSCOPY N/A 06/26/2013   Procedure: ESOPHAGOGASTRODUODENOSCOPY (EGD);  Surgeon: Vertell Novak., MD;  Location:  [facility];  Service: Endoscopy;  Laterality: N/A;  . INSERT / REPLACE / REMOVE PACEMAKER     insertion 2007  . NASAL FRACTURE SURGERY    . SAVORY DILATION  11/02/2011   Procedure: SAVORY DILATION;  Surgeon: Vertell Novak., MD;  Location: [facility];  Service: Endoscopy;  Laterality: N/A;  . TEMPOROMANDIBULAR JOINT ARTHROPLASTY  1982  . TOTAL KNEE ARTHROPLASTY  2001, 2010, 2012   right  . total knee repair  2010   right  . VESICOVAGINAL FISTULA CLOSURE W/ TAH  1974    Current Outpatient Prescriptions  Medication Sig Dispense Refill  . acetaminophen (TYLENOL) 325 MG tablet Take 2 tablets (650 mg total) by mouth every 6 (six) hours as needed for mild pain (or Fever >/= 101). 30 tablet 0  . acetaminophen (TYLENOL) 325 MG tablet Take by mouth.    Marland Kitchen aspirin 81 MG tablet Take 81 mg by mouth daily.      Marland Kitchen atorvastatin (LIPITOR) 20 MG tablet Take 20 mg by mouth daily.      . BD PEN NEEDLE NANO U/F 32G X 4 MM MISC Use as directed for Insulin Injections    . BYSTOLIC 10 MG tablet TAKE 1 TABLET TWICE A DAY, PLEASE MAKE AN APPOINTMENT WITH YOUR DOCTOR 120 tablet 0  . chlorhexidine (PERIDEX) 0.12 % solution Use as directed 5 mLs in the mouth or throat 2 (two) times daily as needed. AS DIRECTED ON BOTTLE  1  . cycloSPORINE (RESTASIS) 0.05 % ophthalmic emulsion Place 1 drop into both eyes 2 (two) times daily.    Marland Kitchen diltiazem (CARDIZEM CD) 120 MG 24 hr capsule Take 120 mg by mouth daily.    . DULoxetine (CYMBALTA) 30 MG capsule Take 90 mg by mouth daily.     Marland Kitchen EPINEPHrine 0.3 mg/0.3 mL IJ SOAJ injection Inject 0.3 mg into the muscle as needed (FOR ALLERGIES).    . fluconazole (DIFLUCAN) 150 MG tablet     . hydrochlorothiazide (HYDRODIURIL) 25 MG tablet Take 0.5 tablets (12.5 mg total) by mouth daily. 45 tablet 11  . hydroxychloroquine (PLAQUENIL) 200 MG tablet     . NOVOLOG FLEXPEN 100 UNIT/ML FlexPen     . NOVOLOG MIX 70/30 FLEXPEN (70-30) 100 UNIT/ML FlexPen Inject 75 Units into the skin 2  (two) times daily.    . Omega-3 Fatty Acids (FISH OIL) 1200 MG CAPS Take 2 capsules by mouth daily.      Marland Kitchen omeprazole-sodium bicarbonate (ZEGERID) 40-1100 MG per capsule Take 1 capsule by mouth at bedtime.      . ondansetron (ZOFRAN) 4 MG tablet Take 1 tablet (4 mg total) by mouth every 6 (six) hours as needed for nausea. 20 tablet 0  . ONETOUCH VERIO test strip     . predniSONE (DELTASONE) 10 MG tablet     . traZODone (DESYREL) 50 MG tablet Take 25 mg by mouth at bedtime.     No current facility-administered medications for this visit.     Allergies:  Cephalexin; Penicillins; Atenolol; Chromic sulfate; Codeine; Desmopressin; Dexilant [dexlansoprazole]; Doxycycline; Entex; Farxiga [dapagliflozin]; Gabapentin; Levaquin [levofloxacin]; Lexapro [escitalopram]; Lisinopril; Losartan potassium; Losartan potassium; Metformin; Other; Pregabalin; Rosiglitazone; Rosiglitazone maleate; Simvastatin; Sitagliptin; Sitagliptin phosphate; and Tradjenta [linagliptin]   Social History:  The patient  reports that she quit smoking about 10 years ago. Her smoking use included Cigarettes. She has a 6.00 pack-year smoking history. She has never used smokeless tobacco. She reports that she does not drink alcohol or use drugs.   Family History:  The patient's family history includes Alcohol abuse in her father; Aneurysm in her sister; Cancer in her father; Deep vein thrombosis in her mother, sister, and sister; Diabetes in her mother, sister, and sister; Heart attack in her mother; Heart disease in her mother and sister; Hyperlipidemia in her mother, sister, and sister; Hypertension in her mother, sister, and sister; Lupus in her sister; Peripheral vascular disease in her mother; Stroke in her mother; Varicose Veins in her mother, sister, and sister.  ROS:  Please see the history of present illness.    All other systems are reviewed and otherwise negative.   PHYSICAL EXAM:  VS:  Ht 5\' 3"  (1.6 m)   Wt 157 lb (71.2 kg)    BMI 27.81 kg/m  BMI: Body mass index is 27.81 kg/m. Well nourished, well developed, in no acute distress  HEENT: normocephalic, atraumatic  Neck: no JVD, carotid bruits or masses Cardiac:  RRR; no significant murmurs, no rubs, or gallops Lungs:  clear to auscultation bilaterally, no wheezing, rhonchi or rales  Abd: soft, nontender MS: no deformity or atrophy Ext: no edema  Skin: warm and dry, no rash, she evidence of excoriation on her neck (she tells me she scratches secondary to "nerves and anxiety" Neuro:  No gross deficits appreciated Psych: euthymic mood, full affect  PPM site is stable, no tethering or discomfort   EKG:  Done 10/22/15, reviewed today by myself showed AV paced PPM interrogation done today and reviewed by myself: stable device function, lead and battery measurements stable, no AF PACER DEPENDEDNT  Recent Labs: 10/21/2015: ALT 26; TSH 1.727 10/23/2015: BUN 10; Creatinine, Ser 0.78; Hemoglobin 13.4; Magnesium 1.8; Platelets 177; Potassium 4.3; Sodium 138  No results found for requested labs within last 8760 hours.   CrCl cannot be calculated (Patient's most recent lab result is older than the maximum 21 days allowed.).   Wt Readings from Last 3 Encounters:  02/02/16 157 lb (71.2 kg)  02/01/16 158 lb 1.6 oz (71.7 kg)  01/20/16 154 lb (69.9 kg)     Other studies reviewed: Additional studies/records reviewed today include: summarized above  ASSESSMENT AND PLAN:  1. CHB, sinus node dysfunction, PPM      stable device function, no changes made  2. PAFib     CHA2DS2Vasc is at least 5, not on a/c (7 with CVA, ?secondary to carotid?)     No AF noted on today's pacer check, she reports years of fall episodes secondary to balance/equilbrium     Notes reviewed there is mention of reported "brain bleed" by the patient in 2002     Unclear exactly how long ago her AF was observed, continue to monitor via her device  3. HTN       stable  4. CAD     stable  without symptoms     on BB, statin, ASA     C/w Dr. Katrinka Blazing  5. PVD, hx of CVA     L CEA in 2016  C/w Vascular, saw yesterday with w/u for dizziness in progress   Disposition: F/u with remote device check in 3 months, Dr. Graciela Husbands in 1 year, sooner if needed.  F/U with Dr. Katrinka Blazing as recommended by him.  Current medicines are reviewed at length with the patient today.  The patient did not have any concerns regarding medicines.  Judith Blonder, PA-C 02/02/2016 1:24 PM     [facility] [address] [place] [address] [phone] (office)  [phone] (fax)

## 2016-02-01 NOTE — Progress Notes (Signed)
[facility] [place]  Patient name: [name] MRN: [id number] DOB: 31-Dec-1945 Sex: female  REASON FOR VISIT: dizziness  HPI: The patient is back for follow-up.  She is status post left carotid endarterectomy with patch angioplasty on 10/31/2014.  This was done for asymptomatic left carotid stenosis.  Intraoperative findings included a isolated focal ulcerated lesion at the carotid bifurcation. She did have the internal and external carotid artery rotated 180 apart.  Her postoperative course was uncomplicated.  For the past 5 months, she has been having difficulty with dizziness.  She also has a long-time history of trouble with her equilibrium.  She has been complaining of sharp left-sided headaches which last just a few seconds.  She also states that she has some weakness in her eyes.  She reports a history of "brain bleed" about 2002 as manifested by dizziness, left eye became "droopy", does not think she had vision changes or speech difficulties at that time.  She denies any stroke or TIA sx's since then.  Past Medical History:  Diagnosis Date  . Allergic rhinitis, cause unspecified   . Anxiety state, unspecified   . Arthritis   . Atrial fibrillation (HCC)   . Bronchitis, not specified as acute or chronic   . Cardiac pacemaker medtronic   . 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 Depressive disorder, not elsewhere classified   . Disorders of sacrum   . GERD (gastroesophageal reflux disease)   . Headache(784.0)    history of  . Heart murmur   . History of hiatal hernia   . Hypertension   . LBBB (left bundle branch block)   . Myalgia and myositis, unspecified   . Obstructive sleep apnea (adult) (pediatric)   . Other voice and resonance disorders   . Pain in joint, lower leg    bilateral  . Presence of permanent cardiac pacemaker   . Pulmonary embolism (HCC)   . Rash, skin 2 weeks per  patient   right arm, inner aspect    . Rheumatic fever in pediatric patient   . Total knee replacement status   . Type II or unspecified type diabetes mellitus without mention of complication, not stated as uncontrolled    Type II  . Unspecified asthma(493.90)   . 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 Vertigo    Meclizine (Antivert)    Family History  Problem Relation Age of Onset  . Cancer Father     LUNG  . Alcohol abuse Father   . Diabetes Mother   . Heart disease Mother   . Stroke Mother   . Heart attack Mother   . Hypertension Mother   . Deep vein thrombosis Mother   . Hyperlipidemia Mother   . Varicose Veins Mother   . Peripheral vascular disease Mother     amputation  . Lupus Sister   . Diabetes Sister   . Aneurysm Sister   . Deep vein thrombosis Sister   . Hyperlipidemia Sister   . Hypertension Sister   . Varicose Veins Sister   . Heart disease Sister   . Deep vein thrombosis Sister   . Diabetes Sister   . Hyperlipidemia Sister   . Hypertension Sister   . Varicose Veins Sister     SOCIAL HISTORY: Social History  Substance Use Topics  . Smoking status: Former Smoker    Packs/day: 0.30    Years: 20.00  Types: Cigarettes    Quit date: 2007  . Smokeless tobacco: Never Used     Comment: pt smoked less than 1/4 ppd  . Alcohol use No    Allergies  Allergen Reactions  . Cephalexin Swelling    Tongue and lip swelling  . Penicillins Nausea And Vomiting and Swelling    Oral Cephalosporins cause Tongue and Lip Swelling  . Atenolol     Light headed/hair loss  . Chromic Sulfate     Chromic sutures  . Codeine Nausea Only  . Desmopressin   . Dexilant [Dexlansoprazole] Diarrhea  . Doxycycline Nausea And Vomiting  . Entex     REACTION: unknown  . Farxiga [Dapagliflozin]   . Gabapentin     REACTION: unknown  . Levaquin [Levofloxacin] Other (See Comments)    GI upset  . Lexapro  [Escitalopram]     Weight gain  . Lisinopril   . Losartan Potassium   . Losartan Potassium Other (See Comments)  . Metformin     REACTION: unknown  . Other     Bellaryl  . Pregabalin     REACTION: unknown  . Rosiglitazone Other (See Comments)    REACTION: unknown  . Rosiglitazone Maleate     REACTION: unknown  . Simvastatin     REACTION: unknown  . Sitagliptin Other (See Comments)    REACTION: unknown  . Sitagliptin Phosphate     REACTION: unknown  . Tradjenta [Linagliptin] Other (See Comments)    Joint pain    Current Outpatient Prescriptions  Medication Sig Dispense Refill  . acetaminophen (TYLENOL) 325 MG tablet Take 2 tablets (650 mg total) by mouth every 6 (six) hours as needed for mild pain (or Fever >/= 101). 30 tablet 0  . acetaminophen (TYLENOL) 325 MG tablet Take by mouth.    Marland Kitchen. aspirin 81 MG tablet Take 81 mg by mouth daily.      Marland Kitchen. atorvastatin (LIPITOR) 20 MG tablet Take 20 mg by mouth daily.      Marland Kitchen. atorvastatin (LIPITOR) 20 MG tablet Take by mouth.    . BD PEN NEEDLE NANO U/F 32G X 4 MM MISC Use as directed for Insulin Injections    . BYSTOLIC 10 MG tablet TAKE 1 TABLET TWICE A DAY, PLEASE MAKE AN APPOINTMENT WITH YOUR DOCTOR 120 tablet 0  . chlorhexidine (PERIDEX) 0.12 % solution Use as directed 5 mLs in the mouth or throat 2 (two) times daily as needed. AS DIRECTED ON BOTTLE  1  . cycloSPORINE (RESTASIS) 0.05 % ophthalmic emulsion Place 1 drop into both eyes 2 (two) times daily.    Marland Kitchen. diltiazem (CARDIZEM CD) 120 MG 24 hr capsule Take 120 mg by mouth daily.    . DULoxetine (CYMBALTA) 30 MG capsule Take 90 mg by mouth daily.     . hydrochlorothiazide (HYDRODIURIL) 25 MG tablet Take 0.5 tablets (12.5 mg total) by mouth daily. 45 tablet 11  . hydroxychloroquine (PLAQUENIL) 200 MG tablet     . NOVOLOG MIX 70/30 FLEXPEN (70-30) 100 UNIT/ML FlexPen Inject 75 Units into the skin 2 (two) times daily.    . Omega-3 Fatty Acids (FISH OIL) 1200 MG CAPS Take 2 capsules by  mouth daily.      Marland Kitchen. omeprazole-sodium bicarbonate (ZEGERID) 40-1100 MG per capsule Take 1 capsule by mouth at bedtime.      Letta Pate. ONETOUCH VERIO test strip     . predniSONE (DELTASONE) 10 MG tablet     . traZODone (DESYREL) 50 MG tablet Take  25 mg by mouth at bedtime.    Marland Kitchen EPINEPHrine 0.3 mg/0.3 mL IJ SOAJ injection Inject 0.3 mg into the muscle as needed (FOR ALLERGIES).    . fluconazole (DIFLUCAN) 150 MG tablet     . NOVOLOG FLEXPEN 100 UNIT/ML FlexPen     . ondansetron (ZOFRAN) 4 MG tablet Take 1 tablet (4 mg total) by mouth every 6 (six) hours as needed for nausea. (Patient not taking: Reported on 02/01/2016) 20 tablet 0   No current facility-administered medications for this visit.     REVIEW OF SYSTEMS:  [X]  denotes positive finding, [ ]  denotes negative finding Cardiac  Comments:  Chest pain or chest pressure:    Shortness of breath upon exertion: x   Short of breath when lying flat:    Irregular heart rhythm: x       Vascular    Pain in calf, thigh, or hip brought on by ambulation:    Pain in feet at night that wakes you up from your sleep:     Blood clot in your veins:    Leg swelling:  x       Pulmonary    Oxygen at home:    Productive cough:     Wheezing:         Neurologic    Sudden weakness in arms or legs:  x   Sudden numbness in arms or legs:     Sudden onset of difficulty speaking or slurred speech:    Temporary loss of vision in one eye:     Problems with dizziness:  x       Gastrointestinal    Blood in stool:     Vomited blood:         Genitourinary    Burning when urinating:     Blood in urine:        Psychiatric    Major depression:         Hematologic    Bleeding problems:    Problems with blood clotting too easily:        Skin    Rashes or ulcers:        Constitutional    Fever or chills:      PHYSICAL EXAM: Vitals:   02/01/16 1008  BP: (!) 177/80  Pulse: 68  Resp: 18  Temp: 98.3 F (36.8 C)  TempSrc: Oral  SpO2: 97%  Weight:  158 lb 1.6 oz (71.7 kg)  Height: 5\' 3"  (1.6 m)    GENERAL: The patient is a well-nourished female, in no acute distress. The vital signs are documented above. CARDIAC: There is a regular rate and rhythm.  VASCULAR: no bruits PULMONARY: There is good air exchange bilaterally without wheezing or rales. ABDOMEN: Soft and non-tender with normal pitched bowel sounds.  MUSCULOSKELETAL: There are no major deformities or cyanosis. NEUROLOGIC: No focal weakness or paresthesias are detected. SKIN: There are no ulcers or rashes noted. PSYCHIATRIC: The patient has a normal affect.  DATA:  none  MEDICAL ISSUES: Based on the patient's most recent ultrasound in June 2017, she has bilateral patent carotid arteries and patent vertebral and subclavian arteries.  She's been having symptoms for approximately 5 months.  In order to definitively rule out a vascular etiology, I think additional imaging is warranted both of the neck and the intracranial vasculature.  Therefore I am ordering a CT angiogram of the head and neck.  I will try to coordinate this with the CT scan ordered by Dr.  Yates for this Wednesday.  I will call the patient with results.    Durene CalWells Mackey Varricchio, MD [facility] Tel [phone] Pager [phone]

## 2016-02-02 ENCOUNTER — Ambulatory Visit (INDEPENDENT_AMBULATORY_CARE_PROVIDER_SITE_OTHER): Payer: Medicare Other | Admitting: Physician Assistant

## 2016-02-02 ENCOUNTER — Other Ambulatory Visit: Payer: Self-pay

## 2016-02-02 VITALS — BP 134/64 | HR 68 | Ht 63.0 in | Wt 157.0 lb

## 2016-02-02 DIAGNOSIS — I2510 Atherosclerotic heart disease of native coronary artery without angina pectoris: Secondary | ICD-10-CM

## 2016-02-02 DIAGNOSIS — I10 Essential (primary) hypertension: Secondary | ICD-10-CM

## 2016-02-02 DIAGNOSIS — Z8673 Personal history of transient ischemic attack (TIA), and cerebral infarction without residual deficits: Secondary | ICD-10-CM

## 2016-02-02 DIAGNOSIS — I6529 Occlusion and stenosis of unspecified carotid artery: Secondary | ICD-10-CM

## 2016-02-02 DIAGNOSIS — I480 Paroxysmal atrial fibrillation: Secondary | ICD-10-CM

## 2016-02-02 DIAGNOSIS — I6523 Occlusion and stenosis of bilateral carotid arteries: Secondary | ICD-10-CM

## 2016-02-02 DIAGNOSIS — I442 Atrioventricular block, complete: Secondary | ICD-10-CM | POA: Diagnosis not present

## 2016-02-02 DIAGNOSIS — Z950 Presence of cardiac pacemaker: Secondary | ICD-10-CM

## 2016-02-02 MED ORDER — DILTIAZEM HCL ER COATED BEADS 120 MG PO CP24: 120.0000 mg | ORAL_CAPSULE | Freq: Every day | ORAL | 4 refills | Status: DC

## 2016-02-02 NOTE — Addendum Note (Signed)
Addended by: Burton ApleyPETTY, Chanice Brenton A on: 02/02/2016 09:25 AM   Modules accepted: Orders

## 2016-02-02 NOTE — Patient Instructions (Signed)
Medication Instructions:   Your physician recommends that you continue on your current medications as directed. Please refer to the Current Medication list given to you today.   If you need a refill on your cardiac medications before your next appointment, please call your pharmacy.  Labwork: NONE ORDERED  TODAY    Testing/Procedures: NONE ORDERED  TODAY    Follow-Up:  Your physician wants you to follow-up in: ONE YEAR WITH Graciela HusbandsKLEIN  You will receive a reminder letter in the mail two months in advance. If you don't receive a letter, please call our office to schedule the follow-up appointment.  Remote monitoring is used to monitor your Pacemaker of ICD from home. This monitoring reduces the number of office visits required to check your device to one time per year. It allows us to keep an eye on the functioning of your device to ensure it is working properly. You are scheduled for a device check from home on . 05/03/16..You may send your transmission at any time that day. If you have a wireless device, the transmission will be sent automatically. After your physician reviews your transmission, you will receive a postcard with your next transmission date.     Any Other Special Instructions Will Be Listed Below (If Applicable).

## 2016-02-03 ENCOUNTER — Other Ambulatory Visit: Payer: Self-pay | Admitting: Surgery

## 2016-02-03 ENCOUNTER — Ambulatory Visit (HOSPITAL_COMMUNITY)
Admission: RE | Admit: 2016-02-03 | Discharge: 2016-02-03 | Disposition: A | Discharge status: Home / Self Care | Payer: Medicare Other | Source: Ambulatory Visit | Attending: Surgery | Admitting: Surgery

## 2016-02-03 DIAGNOSIS — M50323 Other cervical disc degeneration at C6-C7 level: Secondary | ICD-10-CM | POA: Insufficient documentation

## 2016-02-03 DIAGNOSIS — I6521 Occlusion and stenosis of right carotid artery: Secondary | ICD-10-CM | POA: Diagnosis not present

## 2016-02-03 DIAGNOSIS — M4802 Spinal stenosis, cervical region: Secondary | ICD-10-CM | POA: Diagnosis not present

## 2016-02-03 DIAGNOSIS — M5021 Other cervical disc displacement,  high cervical region: Secondary | ICD-10-CM | POA: Diagnosis not present

## 2016-02-03 DIAGNOSIS — M50221 Other cervical disc displacement at C4-C5 level: Secondary | ICD-10-CM | POA: Diagnosis not present

## 2016-02-03 DIAGNOSIS — M4722 Other spondylosis with radiculopathy, cervical region: Secondary | ICD-10-CM | POA: Insufficient documentation

## 2016-02-03 LAB — POCT I-STAT CREATININE: CREATININE: 0.9 mg/dL (ref 0.44–1.00)

## 2016-02-03 MED ORDER — IOPAMIDOL (ISOVUE-370) INJECTION 76%
INTRAVENOUS | Status: AC
  Administered 2016-02-03: 50 mL
  Filled 2016-02-03: qty 50

## 2016-02-05 DIAGNOSIS — M064 Inflammatory polyarthropathy: Secondary | ICD-10-CM | POA: Diagnosis not present

## 2016-02-05 DIAGNOSIS — M797 Fibromyalgia: Secondary | ICD-10-CM | POA: Diagnosis not present

## 2016-02-05 DIAGNOSIS — M542 Cervicalgia: Secondary | ICD-10-CM | POA: Diagnosis not present

## 2016-02-05 DIAGNOSIS — M1990 Unspecified osteoarthritis, unspecified site: Secondary | ICD-10-CM | POA: Diagnosis not present

## 2016-02-10 ENCOUNTER — Ambulatory Visit (INDEPENDENT_AMBULATORY_CARE_PROVIDER_SITE_OTHER): Payer: Medicare Other | Admitting: Orthopaedic Surgery

## 2016-02-10 ENCOUNTER — Encounter (INDEPENDENT_AMBULATORY_CARE_PROVIDER_SITE_OTHER): Payer: Self-pay | Admitting: Orthopaedic Surgery

## 2016-02-10 VITALS — BP 124/55 | HR 63 | Ht 63.0 in | Wt 157.0 lb

## 2016-02-10 DIAGNOSIS — I6523 Occlusion and stenosis of bilateral carotid arteries: Secondary | ICD-10-CM | POA: Diagnosis not present

## 2016-02-10 DIAGNOSIS — Z950 Presence of cardiac pacemaker: Secondary | ICD-10-CM | POA: Diagnosis not present

## 2016-02-10 DIAGNOSIS — M47812 Spondylosis without myelopathy or radiculopathy, cervical region: Secondary | ICD-10-CM | POA: Diagnosis not present

## 2016-02-10 NOTE — Progress Notes (Signed)
Office Visit Note   Patient: [name] GlazeLinda J [name]           Date of Birth: 10/02/1945           MRN: [id number] Visit Date: 02/10/2016              Requested by: L.Lupe Carneyean Mitchell, MD [address] [place], [place] [address] PCP: Lupe Carneyean Mitchell, MD   Assessment & Plan: Visit Diagnoses:  1. Spondylosis of cervical region without myelopathy or radiculopathy   2. Cardiac pacemaker in situ   3.      Previous left carotid endarterectomy now with 60% narrowing on the opposite right side pending review by Dr.Brabham.  Plan: Patient cervical spondylosis with moderate narrowing primarily C5-6 and to a lesser degree C6-7. She needs to see the vascular surgeon to make a decision about whether something needs to be done for opposite right carotid. States her neck bothers her on a daily basis bothers her night when she tries to sleep. She could proceed with two-level cervical fusion allograft and plate if no vascular procedure needs to be performed the opposite right carotid. Chart he has a carotid incision on the left side in this same incision could be used for the cervical fusion procedure. Her scan shows progression with endplate sclerosis narrowing of the disc space and progressive foraminal narrowing. She can call after her vascular follow-up and let us know what should like to do.  Follow-Up Instructions: No Follow-up on file.   Orders:  No orders of the defined types were placed in this encounter.  No orders of the defined types were placed in this encounter.     Procedures: No procedures performed   Clinical Data: No additional findings.   Subjective: Chief Complaint  Patient presents with  . Neck - Follow-up    Patient returns to review CT Cervical Spine. She states there has been no change in her symptoms. She did develop rash, burning lips, itching, and swelling after CT scan. ? Allergy to IV dye.  Patient did some Benadryl with some improvement also tried Claritin.  Her itching and rash is improving.  Review of Systems 14 part review of systems updated unchanged from 01/20/2016 office visit   Objective: Vital Signs: BP (!) 124/55   Pulse 63   Ht 5\' 3"  (1.6 m)   Wt 157 lb (71.2 kg)   BMI 27.81 kg/m   Physical Exam  Constitutional: She is oriented to person, place, and time. She appears well-developed.  HENT:  Head: Normocephalic.  Right Ear: External ear normal.  Left Ear: External ear normal.  Eyes: Pupils are equal, round, and reactive to light.  Neck: No tracheal deviation present. No thyromegaly present.  Cardiovascular: Normal rate.   Patient has a pacemaker  Pulmonary/Chest: Effort normal.  Abdominal: Soft.  Musculoskeletal:  Patient has some  bilateral shoulder impingement. Some brachial plexus tenderness negative Spurling. Discomfort with cervical loading some improvement with distraction.  Neurological: She is alert and oriented to person, place, and time.  Skin: Skin is warm and dry.  Psychiatric: She has a normal mood and affect. Her behavior is normal.    Ortho Exam patient noticed some crepitus with neck range of motion. Upper extremity reflexes are 2+ and symmetrical the lower extremity hyperreflexia normal heel toe gait. Patient has pain with brachial plexus compression worse on the left than right.  Specialty Comments:  No specialty comments available.  Imaging: No results found. Status Exam Begun  Exam  Ended   Final [99] 02/03/2016 4:21 PM 02/03/2016 4:57 PM  PACS Images   Show images for CT CERVICAL SPINE WO CONTRAST  Study Result   CLINICAL DATA:  Carotid artery stenosis. Neck pain with bilateral upper extremity radiculopathy. Dizziness. Left parietal headache.  EXAM: CT ANGIOGRAPHY HEAD AND NECK  CT CERVICAL SPINE WITHOUT CONTRAST  TECHNIQUE: Multidetector CT imaging of the head and neck was performed using the standard protocol during bolus administration of intravenous contrast. Multiplanar CT  image reconstructions and MIPs were obtained to evaluate the vascular anatomy. Carotid stenosis measurements (when applicable) are obtained utilizing NASCET criteria, using the distal internal carotid diameter as the denominator.  Multidetector CT imaging of the cervical spine was performed following the standard protocol without intravenous contrast. Multiplanar CT image reconstructions of the cervical spine were also generated.  CONTRAST:  50 mL Isovue 370  COMPARISON:  Head CT 10/21/2015. Cervical spine radiographs 06/15/2015. Cervical spine CT myelogram 08/31/2006.  FINDINGS: CT HEAD FINDINGS  Brain: There is no evidence of acute cortical infarct, intracranial hemorrhage, mass, midline shift, or extra-axial fluid collection. Prominence of the lateral and third ventricles is unchanged and may reflect central predominant cerebral atrophy or less likely chronic hydrocephalus. Periventricular white matter hypodensities are unchanged and nonspecific but compatible with mild chronic small vessel ischemic disease.  Vascular: No hyperdense vessel or unexpected calcification.  Skull: No fracture or focal osseous lesion.  Sinuses: The visualized paranasal sinuses and mastoid air cells are clear.  Orbits: Prior bilateral cataract extraction.  Review of the MIP images confirms the above findings  CTA NECK FINDINGS  Aortic arch: 3 vessel aortic arch. Mild mixed calcified and noncalcified plaque in the aortic arch. Brachiocephalic and subclavian arteries are patent without significant stenosis.  Right carotid system: Patent. Focal eccentric, calcified plaque posteriorly at the carotid bifurcation results in 60% proximal ICA stenosis.  Left carotid system: Patent with mild intimal thickening in the common and proximal internal carotid arteries. No evidence of dissection or significant stenosis.  Vertebral arteries: Patent without evidence of stenosis  or dissection. Left vertebral artery is slightly dominant.  Skeleton: Cervical disc degeneration, more fully evaluated below. Bilateral TMJ arthrosis.  Other neck: 3 mm low-density lesion in the right thyroid lobe.  Upper chest: Clear lung apices.  Review of the MIP images confirms the above findings  CTA HEAD FINDINGS  Anterior circulation: The internal carotid arteries are widely patent from skullbase to carotid termini. ACAs and MCAs are patent without evidence of major branch occlusion or significant stenosis. No aneurysm.  Posterior circulation: The intracranial vertebral arteries are widely patent to the basilar with the left being mildly dominant. Right PICA, left AICA, and bilateral SCA origins are patent. Basilar artery is patent without stenosis. There are patent posterior communicating arteries bilaterally. PCAs are patent without stenosis. No aneurysm.  Venous sinuses: Patent.  Anatomic variants: None.  Delayed phase: No abnormal enhancement.  Review of the MIP images confirms the above findings  CT CERVICAL SPINE FINDINGS  Vertebral alignment is normal. No fracture or destructive osseous lesion is identified. Moderate disc space narrowing is again seen at C5-6 and C6-7 with progressive degenerative endplate changes since 2008. Paraspinal soft tissues are unremarkable.  C2-3: Mild right facet arthrosis and minimal right uncovertebral spurring without stenosis, unchanged from 2008.  C3-4: New mild disc bulging with annular calcification and mild left facet arthrosis without stenosis.  C4-5: Minimal disc bulging, minimal uncovertebral spurring, and progressive moderate left facet arthrosis result in new mild  left neural foraminal stenosis without spinal stenosis.  C5-6: Broad-based posterior disc osteophyte complex results in mild spinal stenosis and mild left neural foraminal stenosis, similar to prior.  C6-7: Progressive disc  degeneration. Broad-based posterior disc osteophyte complex without significant stenosis.  C7-T1:  Mild left facet arthrosis without stenosis.  Review of the MIP images confirms the above findings  IMPRESSION: 1. Unremarkable appearance of the intracranial arterial vasculature. 2. Focal calcified plaque at the right carotid bifurcation with 60% proximal ICA stenosis. 3. Widely patent vertebral arteries. 4. No acute intracranial abnormality or mass. 5. Mildly progressive cervical disc degeneration, most advanced at C5-6 and C6-7. Mild spinal and mild left foraminal stenosis at C5-6. 6. New mild left foraminal stenosis at C4-5.   Electronically Signed   By: Sebastian AcheAllen  Grady M.D.   On: 02/03/2016 17:38      PMFS History: Patient Active Problem List   Diagnosis Date Noted  . UTI (urinary tract infection) 10/22/2015  . Hyponatremia 10/22/2015  . Hypokalemia 10/22/2015  . AKI (acute kidney injury) (HCC) 10/22/2015  . Delirium 10/21/2015  . Asymptomatic carotid artery stenosis 10/31/2014  . Left carotid bruit 08/26/2013  . Palpitations 06/24/2013  . 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 Hypertension   . Itching 01/03/2013  . GERD (gastroesophageal reflux disease) 11/23/2011  . PULMONARY EMBOLISM, hx of 12/22/2008  . RHINOCONJUNCTIVITIS, ALLERGIC 12/20/2007  . SACROILIAC JOINT DYSFUNCTION 09/28/2007  . Depressive disorder, not elsewhere classified 08/28/2007  . KNEE PAIN, RIGHT 08/28/2007  . PAIN IN JOINT OTHER SPECIFIED SITES 08/28/2007  . 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 CARDIAC PACEMAKER IN SITU 06/06/2007  . Diabetes mellitus, type 2 (HCC) 05/16/2007  . ANXIETY 05/16/2007  . Obstructive sleep apnea 05/16/2007  . H/O: CVA (cerebrovascular accident) 05/16/2007  . BRONCHITIS 05/16/2007  . FIBROMYALGIA 05/16/2007  . FACIAL PAIN 05/16/2007  . DYSPHONIA 05/16/2007  . OTHER DYSPHAGIA 05/16/2007   Past Medical History:  Diagnosis Date    . Allergic rhinitis, cause unspecified   . Anxiety state, unspecified   . Arthritis   . Atrial fibrillation (HCC)   . Bronchitis, not specified as acute or chronic   . Cardiac pacemaker medtronic   . 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 Depressive disorder, not elsewhere classified   . Disorders of sacrum   . GERD (gastroesophageal reflux disease)   . Headache(784.0)    history of  . Heart murmur   . History of hiatal hernia   . Hypertension   . LBBB (left bundle branch block)   . Myalgia and myositis, unspecified   . Obstructive sleep apnea (adult) (pediatric)   . Other voice and resonance disorders   . Pain in joint, lower leg    bilateral  . Presence of permanent cardiac pacemaker   . Pulmonary embolism (HCC)   . Rash, skin 2 weeks per patient   right arm, inner aspect    . Rheumatic fever in pediatric patient   . Total knee replacement status   . Type II or unspecified type diabetes mellitus without mention of complication, not stated as uncontrolled    Type II  . Unspecified asthma(493.90)   . 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 Vertigo    Meclizine (Antivert)    Family History  Problem Relation Age of Onset  . Cancer Father     LUNG  . Alcohol abuse Father   . Diabetes Mother   .  Heart disease Mother   . Stroke Mother   . Heart attack Mother   . Hypertension Mother   . Deep vein thrombosis Mother   . Hyperlipidemia Mother   . Varicose Veins Mother   . Peripheral vascular disease Mother     amputation  . Lupus Sister   . Diabetes Sister   . Aneurysm Sister   . Deep vein thrombosis Sister   . Hyperlipidemia Sister   . Hypertension Sister   . Varicose Veins Sister   . Heart disease Sister   . Deep vein thrombosis Sister   . Diabetes Sister   . Hyperlipidemia Sister   . Hypertension Sister   . Varicose Veins Sister     Past Surgical History:  Procedure  Laterality Date  . BALLOON DILATION N/A 06/26/2013   Procedure: BALLOON DILATION;  Surgeon: Vertell Novak., MD;  Location: [facility];  Service: Endoscopy;  Laterality: N/A;  . BILATERAL SALPINGOOPHORECTOMY  1991  . CHOLECYSTECTOMY    . COLONOSCOPY N/A 06/26/2013   Procedure: COLONOSCOPY;  Surgeon: Vertell Novak., MD;  Location: [facility];  Service: Endoscopy;  Laterality: N/A;  . ELBOW SURGERY Bilateral   . ENDARTERECTOMY Left 10/31/2014   Procedure: LEFT CAROTID ENDARTERECTOMY WITH BOVINE PERICARDIAL PATCH ANGIOPLASTY;  Surgeon: Nada Libman, MD;  Location: [facility];  Service: Vascular;  Laterality: Left;  . ESOPHAGOGASTRODUODENOSCOPY  11/02/2011   Procedure: ESOPHAGOGASTRODUODENOSCOPY (EGD);  Surgeon: Vertell Novak., MD;  Location: [facility];  Service: Endoscopy;  Laterality: N/A;  with xray  . ESOPHAGOGASTRODUODENOSCOPY N/A 06/26/2013   Procedure: ESOPHAGOGASTRODUODENOSCOPY (EGD);  Surgeon: Vertell Novak., MD;  Location: [facility];  Service: Endoscopy;  Laterality: N/A;  . INSERT / REPLACE / REMOVE PACEMAKER     insertion 2007  . NASAL FRACTURE SURGERY    . SAVORY DILATION  11/02/2011   Procedure: SAVORY DILATION;  Surgeon: Vertell Novak., MD;  Location: [facility];  Service: Endoscopy;  Laterality: N/A;  . TEMPOROMANDIBULAR JOINT ARTHROPLASTY  1982  . TOTAL KNEE ARTHROPLASTY  2001, 2010, 2012   right  . total knee repair  2010   right  . VESICOVAGINAL FISTULA CLOSURE W/ TAH  1974   Social History   Occupational History  . Not on file.   Social History Main Topics  . Smoking status: Former Smoker    Packs/day: 0.30    Years: 20.00    Types: Cigarettes    Quit date: 2007  . Smokeless tobacco: Never Used     Comment: pt smoked less than 1/4 ppd  . Alcohol use No  . Drug use: No     Comment: "now, marijuana I would do..." - but doesn't by report  . Sexual activity: Not on file

## 2016-02-19 ENCOUNTER — Telehealth (INDEPENDENT_AMBULATORY_CARE_PROVIDER_SITE_OTHER): Payer: Self-pay | Admitting: Orthopedic Surgery

## 2016-02-19 NOTE — Telephone Encounter (Signed)
IC pt and advised that I have called in her Rx for her.

## 2016-02-19 NOTE — Telephone Encounter (Signed)
Ms. [name] called in regarding scheduling Cspine surgery. We are waiting on clearance from [name]'s office. Patient just went to have her pacemaker checked and thinks that she should be good go.  In the meantime, she is requesting an Rx for pain to last until we can perform the surgery.  States that she has taken Tramadol in the past and it helped.  "I need something to relax my neck".

## 2016-02-19 NOTE — Telephone Encounter (Signed)
Ok call in ultram 30  Tablets   1 po bid prn pain   thanks

## 2016-02-19 NOTE — Telephone Encounter (Signed)
See message below concerning a Rx for pain medicine. Thank You

## 2016-02-23 ENCOUNTER — Encounter (HOSPITAL_COMMUNITY): Payer: Medicare Other

## 2016-02-23 ENCOUNTER — Ambulatory Visit: Payer: Medicare Other | Admitting: Family

## 2016-03-01 ENCOUNTER — Ambulatory Visit: Payer: Medicare Other | Admitting: Internal Medicine

## 2016-03-08 DIAGNOSIS — M797 Fibromyalgia: Secondary | ICD-10-CM | POA: Diagnosis not present

## 2016-03-08 DIAGNOSIS — M1990 Unspecified osteoarthritis, unspecified site: Secondary | ICD-10-CM | POA: Diagnosis not present

## 2016-03-08 DIAGNOSIS — M3500 Sicca syndrome, unspecified: Secondary | ICD-10-CM | POA: Diagnosis not present

## 2016-03-08 DIAGNOSIS — M542 Cervicalgia: Secondary | ICD-10-CM | POA: Diagnosis not present

## 2016-03-10 ENCOUNTER — Telehealth: Payer: Self-pay | Admitting: Interventional Cardiology

## 2016-03-10 DIAGNOSIS — R0609 Other forms of dyspnea: Principal | ICD-10-CM

## 2016-03-10 DIAGNOSIS — R443 Hallucinations, unspecified: Secondary | ICD-10-CM | POA: Diagnosis not present

## 2016-03-10 DIAGNOSIS — R300 Dysuria: Secondary | ICD-10-CM | POA: Diagnosis not present

## 2016-03-10 DIAGNOSIS — E1165 Type 2 diabetes mellitus with hyperglycemia: Secondary | ICD-10-CM | POA: Diagnosis not present

## 2016-03-10 DIAGNOSIS — I10 Essential (primary) hypertension: Secondary | ICD-10-CM | POA: Diagnosis not present

## 2016-03-10 DIAGNOSIS — E7800 Pure hypercholesterolemia, unspecified: Secondary | ICD-10-CM | POA: Diagnosis not present

## 2016-03-10 NOTE — Telephone Encounter (Signed)
Request for surgical clearance:  1. What type of surgery is being performed? Allograft Plate ACD&F Z6-1C5-6, C 6-7   2. When is this surgery scheduled? Pending   3. Are there any medications that need to be held prior to surgery and how long? None listed on clearance form   4. Name of physician performing surgery? Dr. Ophelia CharterYates at [facility]   5. What is your office phone and fax number? Ph [phone]  Fax [phone]   Pt last seen in our office by [name] Dowseenee Ursuy, PA-C on 02/02/16.

## 2016-03-13 NOTE — Telephone Encounter (Signed)
She needs a clearance OV with APP. I have not seen in 18 months.

## 2016-03-14 MED ORDER — SPIRONOLACTONE 25 MG PO TABS: 12.5000 mg | ORAL_TABLET | Freq: Every day | ORAL | 3 refills | Status: DC

## 2016-03-14 NOTE — Telephone Encounter (Signed)
Called and spoke with pt.  She's in agreement with appt.  Asked that I speak to husband to schedule.  Scheduled pt to see Bary CastillaKaty Thompson, PA-C on 03/22/16 for surgical clearance.  Will forward message to requesting office to make them aware

## 2016-03-15 ENCOUNTER — Other Ambulatory Visit: Payer: Self-pay | Admitting: *Deleted

## 2016-03-15 MED ORDER — NEBIVOLOL HCL 10 MG PO TABS: ORAL_TABLET | ORAL | 0 refills | Status: DC

## 2016-03-17 ENCOUNTER — Encounter: Payer: Self-pay | Admitting: Physician Assistant

## 2016-03-17 ENCOUNTER — Ambulatory Visit (INDEPENDENT_AMBULATORY_CARE_PROVIDER_SITE_OTHER): Payer: Medicare Other | Admitting: Physician Assistant

## 2016-03-17 VITALS — BP 118/50 | HR 65 | Ht 63.0 in | Wt 156.1 lb

## 2016-03-17 DIAGNOSIS — Z8673 Personal history of transient ischemic attack (TIA), and cerebral infarction without residual deficits: Secondary | ICD-10-CM

## 2016-03-17 DIAGNOSIS — I480 Paroxysmal atrial fibrillation: Secondary | ICD-10-CM

## 2016-03-17 DIAGNOSIS — I2510 Atherosclerotic heart disease of native coronary artery without angina pectoris: Secondary | ICD-10-CM | POA: Diagnosis not present

## 2016-03-17 DIAGNOSIS — Z950 Presence of cardiac pacemaker: Secondary | ICD-10-CM | POA: Diagnosis not present

## 2016-03-17 DIAGNOSIS — I10 Essential (primary) hypertension: Secondary | ICD-10-CM | POA: Diagnosis not present

## 2016-03-17 DIAGNOSIS — I442 Atrioventricular block, complete: Secondary | ICD-10-CM | POA: Diagnosis not present

## 2016-03-17 DIAGNOSIS — Z01818 Encounter for other preprocedural examination: Secondary | ICD-10-CM | POA: Diagnosis not present

## 2016-03-17 DIAGNOSIS — I6523 Occlusion and stenosis of bilateral carotid arteries: Secondary | ICD-10-CM

## 2016-03-17 MED ORDER — NEBIVOLOL HCL 10 MG PO TABS: ORAL_TABLET | ORAL | 3 refills | Status: DC

## 2016-03-17 NOTE — Progress Notes (Signed)
Cardiology Office Note    Date:  03/17/2016   ID:  Pascha, Fogal 1945/11/12, MRN [id number]  PCP:  Lupe Carney, MD  Cardiologist:  Dr. Katrinka Blazing EP: Dr. Graciela Husbands  CC: pre op clearance prior to orthopedic surgery  History of Present Illness:  [name] is a 71 y.o. 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   She had a cath in 2005 that showed widely patent proximal coronaries with mild to moderate distal diffuse left anterior descending disease of the apex and normal left ventricular function. She had a low risk nuc in 2008.   She is followed by vascular and had a L CEA in 10/2014. She was recently seen for dizziness but carotid dopplers 08/2015 showed stable RICA disease and patent L CEA.   She saw Clementeen Hoof PA-C for device follow up on 02/02/16 and was doing well. It was noted that she had a chart history of PAF, but not clear when. She is not on OAC (previously on coumadin for PE) despite high CHADSVASC score. No recurrent afib on device. If intercurrent afib is diagnosed, she will likely need to be placed on an anticoagulant.   She is planning to undergo allograft plate placement of cervical spine and presents today for pre op surgical clearance. No CP or SOB. Chronic LE edema, orthopnea or PND. No dizziness or syncope. No blood in stool or urine. No palpitations. She is not very active at all. The most exertion she gets is walking around at the grocery store and house work. Walking is limited by leg achiness. She has also had 3 knee replacements.    Past Medical History:  Diagnosis Date  . Allergic rhinitis, cause unspecified   . Anxiety state, unspecified   . Arthritis   . Atrial fibrillation (HCC)   . Bronchitis, not specified as acute or chronic   . Cardiac pacemaker medtronic   . Chronotropic incompetence with sinus  node dysfunction (HCC)   . Depressive disorder, not elsewhere classified   . Disorders of sacrum   . GERD (gastroesophageal reflux disease)   . Headache(784.0)    history of  . Heart murmur   . History of hiatal hernia   . Hypertension   . LBBB (left bundle branch block)   . Myalgia and myositis, unspecified   . Obstructive sleep apnea (adult) (pediatric)   . Other voice and resonance disorders   . Pain in joint, lower leg    bilateral  . Presence of permanent cardiac pacemaker   . Pulmonary embolism (HCC)   . Rash, skin 2 weeks per patient   right arm, inner aspect    . Rheumatic fever in pediatric patient   . Total knee replacement status   . Type II or unspecified type diabetes mellitus without mention of complication, not stated as uncontrolled    Type II  . Unspecified asthma(493.90)   . 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 Vertigo    Meclizine (Antivert)    Past Surgical History:  Procedure Laterality Date  . BALLOON DILATION N/A 06/26/2013   Procedure: BALLOON DILATION;  Surgeon: Vertell Novak., MD;  Location: [facility];  Service: Endoscopy;  Laterality: N/A;  . BILATERAL SALPINGOOPHORECTOMY  1991  . CHOLECYSTECTOMY    .  COLONOSCOPY N/A 06/26/2013   Procedure: COLONOSCOPY;  Surgeon: Vertell Novak., MD;  Location: [facility];  Service: Endoscopy;  Laterality: N/A;  . ELBOW SURGERY Bilateral   . ENDARTERECTOMY Left 10/31/2014   Procedure: LEFT CAROTID ENDARTERECTOMY WITH BOVINE PERICARDIAL PATCH ANGIOPLASTY;  Surgeon: Nada Libman, MD;  Location: [facility];  Service: Vascular;  Laterality: Left;  . ESOPHAGOGASTRODUODENOSCOPY  11/02/2011   Procedure: ESOPHAGOGASTRODUODENOSCOPY (EGD);  Surgeon: Vertell Novak., MD;  Location: [facility];  Service: Endoscopy;  Laterality: N/A;  with xray  . ESOPHAGOGASTRODUODENOSCOPY N/A 06/26/2013   Procedure: ESOPHAGOGASTRODUODENOSCOPY (EGD);  Surgeon: Vertell Novak., MD;  Location: [facility];  Service: Endoscopy;  Laterality: N/A;  . INSERT / REPLACE / REMOVE PACEMAKER     insertion 2007  . NASAL FRACTURE SURGERY    . SAVORY DILATION  11/02/2011   Procedure: SAVORY DILATION;  Surgeon: Vertell Novak., MD;  Location: [facility];  Service: Endoscopy;  Laterality: N/A;  . TEMPOROMANDIBULAR JOINT ARTHROPLASTY  1982  . TOTAL KNEE ARTHROPLASTY  2001, 2010, 2012   right  . total knee repair  2010   right  . VESICOVAGINAL FISTULA CLOSURE W/ TAH  1974    Current Medications: Outpatient Medications Prior to Visit  Medication Sig Dispense Refill  . acetaminophen (TYLENOL) 325 MG tablet Take 2 tablets (650 mg total) by mouth every 6 (six) hours as needed for mild pain (or Fever >/= 101). 30 tablet 0  . aspirin 81 MG tablet Take 81 mg by mouth daily.      Marland Kitchen atorvastatin (LIPITOR) 20 MG tablet Take 20 mg by mouth daily.      . BD PEN NEEDLE NANO U/F 32G X 4 MM MISC Use as directed for Insulin Injections    . chlorhexidine (PERIDEX) 0.12 % solution Use as directed 5 mLs in the mouth or throat 2 (two) times daily as needed. AS DIRECTED ON BOTTLE  1  . cycloSPORINE (RESTASIS) 0.05 % ophthalmic emulsion Place 1 drop into both eyes 2 (two) times daily.    Marland Kitchen diltiazem (CARDIZEM CD) 120 MG 24 hr capsule Take 1 capsule (120 mg total) by mouth daily. 90 capsule 4  . DULoxetine (CYMBALTA) 30 MG capsule Take 90 mg by mouth daily.     Marland Kitchen EPINEPHrine 0.3 mg/0.3 mL IJ SOAJ injection Inject 0.3 mg into the muscle as needed (FOR ALLERGIES).    . hydrochlorothiazide (HYDRODIURIL) 25 MG tablet Take 0.5 tablets (12.5 mg total) by mouth daily. 45 tablet 11  . NOVOLOG FLEXPEN 100 UNIT/ML FlexPen Inject into the skin 3 (three) times daily with meals. Patient not sure how many unit she taking    . Omega-3 Fatty Acids (FISH OIL) 1200 MG CAPS Take 2 capsules by mouth daily.      Marland Kitchen omeprazole-sodium bicarbonate (ZEGERID) 40-1100 MG per capsule Take 1 capsule by mouth at  bedtime.      . ondansetron (ZOFRAN) 4 MG tablet Take 1 tablet (4 mg total) by mouth every 6 (six) hours as needed for nausea. 20 tablet 0  . ONETOUCH VERIO test strip 1 each by Other route as needed for other (blood sugar).     . predniSONE (DELTASONE) 10 MG tablet Take 10 mg by mouth daily with breakfast.     . traZODone (DESYREL) 50 MG tablet Take 25 mg by mouth at bedtime.    . nebivolol (BYSTOLIC) 10 MG tablet TAKE 1 TABLET BY MOUTH TWICE A DAY. 14 tablet 0  .  acetaminophen (TYLENOL) 325 MG tablet Take by mouth.    . fluconazole (DIFLUCAN) 150 MG tablet     . hydroxychloroquine (PLAQUENIL) 200 MG tablet      No facility-administered medications prior to visit.      Allergies:   Cephalexin; Penicillins; Atenolol; Chromic sulfate; Codeine; Contrast media [iodinated diagnostic agents]; Desmopressin; Dexilant [dexlansoprazole]; Doxycycline; Entex; Farxiga [dapagliflozin]; Gabapentin; Levaquin [levofloxacin]; Lexapro [escitalopram]; Lisinopril; Losartan potassium; Losartan potassium; Metformin; Other; Pregabalin; Rosiglitazone; Rosiglitazone maleate; Simvastatin; Sitagliptin; Sitagliptin phosphate; and Tradjenta [linagliptin]   Social History   Social History  . Marital status: Married    Spouse name: N/A  . Number of children: N/A  . Years of education: N/A   Social History Main Topics  . Smoking status: Former Smoker    Packs/day: 0.30    Years: 20.00    Types: Cigarettes    Quit date: 2007  . Smokeless tobacco: Never Used     Comment: pt smoked less than 1/4 ppd  . Alcohol use No  . Drug use: No     Comment: "now, marijuana I would do..." - but doesn't by report  . Sexual activity: Not Asked   Other Topics Concern  . None   Social History Narrative  . None     Family History:  The patient's family history includes Alcohol abuse in her father; Aneurysm in her sister; Cancer in her father; Deep vein thrombosis in her mother, sister, and sister; Diabetes in her mother,  sister, and sister; Heart attack in her mother; Heart disease in her mother and sister; Hyperlipidemia in her mother, sister, and sister; Hypertension in her mother, sister, and sister; Lupus in her sister; Peripheral vascular disease in her mother; Stroke in her mother; Varicose Veins in her mother, sister, and sister.      ROS:   Please see the history of present illness.    ROS All other systems reviewed and are negative.   PHYSICAL EXAM:   VS:  BP (!) 118/50   Pulse 65   Ht 5\' 3"  (1.6 m)   Wt 156 lb 1.9 oz (70.8 kg)   BMI 27.66 kg/m    GEN: Well nourished, well developed, in no acute distress  HEENT: normal  Neck: no JVD, carotid bruits, or masses Cardiac: RRR; no murmurs, rubs, or gallops,no edema  Respiratory:  clear to auscultation bilaterally, normal work of breathing GI: soft, nontender, nondistended, + BS MS: no deformity or atrophy  Skin: warm and dry, no rash Neuro:  Alert and Oriented x 3, Strength and sensation are intact Psych: euthymic mood, full affect   Wt Readings from Last 3 Encounters:  03/17/16 156 lb 1.9 oz (70.8 kg)  02/10/16 157 lb (71.2 kg)  02/02/16 157 lb (71.2 kg)      Studies/Labs Reviewed:   EKG:  EKG is ordered today.  The ekg ordered today demonstrates AV paced.   Recent Labs: 10/21/2015: ALT 26; TSH 1.727 10/23/2015: BUN 10; Hemoglobin 13.4; Magnesium 1.8; Platelets 177; Potassium 4.3; Sodium 138 02/03/2016: Creatinine, Ser 0.90   Additional studies/ records that were reviewed today include:  Cath 2005  CONCLUSIONS:  1. Widely patent proximal coronaries.  There is mild to moderate distal     diffuse left anterior descending disease of the apex.  2. Normal left ventricular function.  3. Hypokalemia identified on pre cath blood work.   PLAN:  K-Dur 40 mEq per day.  Aggressive risk factor modification.  No  specific therapy for angina or ischemic symptoms  is required as there appear  to be no focal high grade obstructive lesions  noted.  The patient should be  on aspirin per day as well.   Nuclear stress test 2008: no ischemic EF 59%    ASSESSMENT & PLAN:   Pre op risk assessment: she has no chest pain or SOB. She is not very active and I am not sure she ever performs over 4 METS. Her Chales Abrahams perioperative risk for MI or Cardiac arrest is 1%. I have cleared her for surgery without further testing.   CAD: non obstructive disease by cath in 2005. Normal nuclear stress test in 2008  Sinus node dysfunction: s/p PPM. Continue regular EP follow up.   PAF: remote. Not on oral anticoagulation for unclear reasons. CHADSVASC at least 6 (HTN, age, vasc dz, CVA, F sex). If she has intercurrent afib , she will need to be placed on anticoagulation   HTN: Bp well controlled currently.   PVD: s/p L CEA. Recent dopplers with patent L CEA. Continue follow up with [facility].   Hx of CVA: continue ASA and statin    Medication Adjustments/Labs and Tests Ordered: Current medicines are reviewed at length with the patient today.  Concerns regarding medicines are outlined above.  Medication changes, Labs and Tests ordered today are listed in the Patient Instructions below. Patient Instructions  Medication Instructions:  Your physician recommends that you continue on your current medications as directed. Please refer to the Current Medication list given to you today.   Labwork: None ordered  Testing/Procedures: None ordered  Follow-Up: Your physician wants you to follow-up in: 1 YEAR WITH DR. Marlou Starks will receive a reminder letter in the mail two months in advance. If you don't receive a letter, please call our office to schedule the follow-up appointment.   Any Other Special Instructions Will Be Listed Below (If Applicable).   If you need a refill on your cardiac medications before your next appointment, please call your pharmacy.      Signed, Cline Crock, PA-C  03/17/2016 6:55 PM    [facility]  [facility] [address] [place], [place], [place]  [address] Phone: [phone]; Fax: [phone]

## 2016-03-17 NOTE — Patient Instructions (Addendum)
Medication Instructions:  Your physician recommends that you continue on your current medications as directed. Please refer to the Current Medication list given to you today.   Labwork: None ordered  Testing/Procedures: None ordered  Follow-Up: Your physician wants you to follow-up in: 1 YEAR WITH DR. SMITH  You will receive a reminder letter in the mail two months in advance. If you don't receive a letter, please call our office to schedule the follow-up appointment.   Any Other Special Instructions Will Be Listed Below (If Applicable).    If you need a refill on your cardiac medications before your next appointment, please call your pharmacy.   

## 2016-03-22 ENCOUNTER — Ambulatory Visit: Payer: Medicare Other | Admitting: Physician Assistant

## 2016-03-30 DIAGNOSIS — I2510 Atherosclerotic heart disease of native coronary artery without angina pectoris: Secondary | ICD-10-CM | POA: Diagnosis not present

## 2016-03-30 DIAGNOSIS — R443 Hallucinations, unspecified: Secondary | ICD-10-CM | POA: Diagnosis not present

## 2016-03-30 DIAGNOSIS — E7800 Pure hypercholesterolemia, unspecified: Secondary | ICD-10-CM | POA: Diagnosis not present

## 2016-03-30 DIAGNOSIS — E1165 Type 2 diabetes mellitus with hyperglycemia: Secondary | ICD-10-CM | POA: Diagnosis not present

## 2016-04-08 ENCOUNTER — Other Ambulatory Visit (INDEPENDENT_AMBULATORY_CARE_PROVIDER_SITE_OTHER): Payer: Self-pay | Admitting: Orthopedic Surgery

## 2016-04-08 ENCOUNTER — Other Ambulatory Visit (INDEPENDENT_AMBULATORY_CARE_PROVIDER_SITE_OTHER): Payer: Self-pay | Admitting: Orthopaedic Surgery

## 2016-04-08 DIAGNOSIS — M4712 Other spondylosis with myelopathy, cervical region: Secondary | ICD-10-CM

## 2016-04-12 DIAGNOSIS — E7800 Pure hypercholesterolemia, unspecified: Secondary | ICD-10-CM | POA: Diagnosis not present

## 2016-04-12 DIAGNOSIS — I10 Essential (primary) hypertension: Secondary | ICD-10-CM | POA: Diagnosis not present

## 2016-04-12 DIAGNOSIS — E039 Hypothyroidism, unspecified: Secondary | ICD-10-CM | POA: Diagnosis not present

## 2016-04-12 DIAGNOSIS — E1165 Type 2 diabetes mellitus with hyperglycemia: Secondary | ICD-10-CM | POA: Diagnosis not present

## 2016-04-13 DIAGNOSIS — K5730 Diverticulosis of large intestine without perforation or abscess without bleeding: Secondary | ICD-10-CM | POA: Diagnosis not present

## 2016-04-13 DIAGNOSIS — N201 Calculus of ureter: Secondary | ICD-10-CM | POA: Diagnosis not present

## 2016-04-13 DIAGNOSIS — R112 Nausea with vomiting, unspecified: Secondary | ICD-10-CM | POA: Diagnosis not present

## 2016-04-13 DIAGNOSIS — R350 Frequency of micturition: Secondary | ICD-10-CM | POA: Diagnosis not present

## 2016-04-13 DIAGNOSIS — N3010 Interstitial cystitis (chronic) without hematuria: Secondary | ICD-10-CM | POA: Diagnosis not present

## 2016-04-21 ENCOUNTER — Encounter (HOSPITAL_COMMUNITY): Payer: Self-pay

## 2016-04-21 ENCOUNTER — Encounter (HOSPITAL_COMMUNITY)
Admission: RE | Admit: 2016-04-21 | Discharge: 2016-04-21 | Disposition: A | Discharge status: Home / Self Care | Payer: Medicare Other | Source: Ambulatory Visit | Attending: Orthopaedic Surgery | Admitting: Orthopaedic Surgery

## 2016-04-21 DIAGNOSIS — Z96651 Presence of right artificial knee joint: Secondary | ICD-10-CM | POA: Diagnosis not present

## 2016-04-21 DIAGNOSIS — Z87891 Personal history of nicotine dependence: Secondary | ICD-10-CM | POA: Insufficient documentation

## 2016-04-21 DIAGNOSIS — F329 Major depressive disorder, single episode, unspecified: Secondary | ICD-10-CM | POA: Diagnosis not present

## 2016-04-21 DIAGNOSIS — J45909 Unspecified asthma, uncomplicated: Secondary | ICD-10-CM | POA: Insufficient documentation

## 2016-04-21 DIAGNOSIS — Z01818 Encounter for other preprocedural examination: Secondary | ICD-10-CM | POA: Diagnosis not present

## 2016-04-21 DIAGNOSIS — Z7982 Long term (current) use of aspirin: Secondary | ICD-10-CM | POA: Diagnosis not present

## 2016-04-21 DIAGNOSIS — M47812 Spondylosis without myelopathy or radiculopathy, cervical region: Secondary | ICD-10-CM | POA: Insufficient documentation

## 2016-04-21 DIAGNOSIS — I4891 Unspecified atrial fibrillation: Secondary | ICD-10-CM | POA: Insufficient documentation

## 2016-04-21 DIAGNOSIS — Z01812 Encounter for preprocedural laboratory examination: Secondary | ICD-10-CM | POA: Insufficient documentation

## 2016-04-21 DIAGNOSIS — Z794 Long term (current) use of insulin: Secondary | ICD-10-CM | POA: Diagnosis not present

## 2016-04-21 DIAGNOSIS — Z950 Presence of cardiac pacemaker: Secondary | ICD-10-CM | POA: Diagnosis not present

## 2016-04-21 DIAGNOSIS — Z86711 Personal history of pulmonary embolism: Secondary | ICD-10-CM | POA: Insufficient documentation

## 2016-04-21 DIAGNOSIS — G4733 Obstructive sleep apnea (adult) (pediatric): Secondary | ICD-10-CM | POA: Diagnosis not present

## 2016-04-21 DIAGNOSIS — I10 Essential (primary) hypertension: Secondary | ICD-10-CM | POA: Diagnosis not present

## 2016-04-21 DIAGNOSIS — Z86718 Personal history of other venous thrombosis and embolism: Secondary | ICD-10-CM | POA: Insufficient documentation

## 2016-04-21 DIAGNOSIS — Z79899 Other long term (current) drug therapy: Secondary | ICD-10-CM | POA: Insufficient documentation

## 2016-04-21 DIAGNOSIS — I495 Sick sinus syndrome: Secondary | ICD-10-CM | POA: Diagnosis not present

## 2016-04-21 DIAGNOSIS — E119 Type 2 diabetes mellitus without complications: Secondary | ICD-10-CM | POA: Insufficient documentation

## 2016-04-21 DIAGNOSIS — F419 Anxiety disorder, unspecified: Secondary | ICD-10-CM | POA: Diagnosis not present

## 2016-04-21 LAB — GLUCOSE, CAPILLARY: Glucose-Capillary: 296 mg/dL — ABNORMAL HIGH (ref 65–99)

## 2016-04-21 LAB — URINALYSIS, ROUTINE W REFLEX MICROSCOPIC
BILIRUBIN URINE: NEGATIVE
Bacteria, UA: NONE SEEN
Glucose, UA: >=500 mg/dL — AB
Hgb urine dipstick: NEGATIVE
KETONES UR: 5 mg/dL — AB
Leukocytes, UA: NEGATIVE
Nitrite: NEGATIVE
PH: 5 (ref 5.0–8.0)
Protein, ur: NEGATIVE mg/dL
SPECIFIC GRAVITY, URINE: 1.025 (ref 1.005–1.030)

## 2016-04-21 LAB — CBC
HEMATOCRIT: 41.3 % (ref 36.0–46.0)
Hemoglobin: 14.3 g/dL (ref 12.0–15.0)
MCH: 29.2 pg (ref 26.0–34.0)
MCHC: 34.6 g/dL (ref 30.0–36.0)
MCV: 84.3 fL (ref 78.0–100.0)
Platelets: 247 10*3/uL (ref 150–400)
RBC: 4.9 MIL/uL (ref 3.87–5.11)
RDW: 13.8 % (ref 11.5–15.5)
WBC: 9.9 10*3/uL (ref 4.0–10.5)

## 2016-04-21 LAB — COMPREHENSIVE METABOLIC PANEL
ALBUMIN: 4.1 g/dL (ref 3.5–5.0)
ALK PHOS: 89 U/L (ref 38–126)
ALT: 20 U/L (ref 14–54)
AST: 24 U/L (ref 15–41)
Anion gap: 10 (ref 5–15)
BILIRUBIN TOTAL: 0.9 mg/dL (ref 0.3–1.2)
BUN: 14 mg/dL (ref 6–20)
CO2: 26 mmol/L (ref 22–32)
CREATININE: 0.99 mg/dL (ref 0.44–1.00)
Calcium: 9.6 mg/dL (ref 8.9–10.3)
Chloride: 98 mmol/L — ABNORMAL LOW (ref 101–111)
GFR calc Af Amer: >60 mL/min (ref >60)
GFR, EST NON AFRICAN AMERICAN: 56 mL/min — AB (ref >60)
GLUCOSE: 334 mg/dL — AB (ref 65–99)
POTASSIUM: 3.7 mmol/L (ref 3.5–5.1)
Sodium: 134 mmol/L — ABNORMAL LOW (ref 135–145)
TOTAL PROTEIN: 6.8 g/dL (ref 6.5–8.1)

## 2016-04-21 LAB — SURGICAL PCR SCREEN
MRSA, PCR: NEGATIVE
STAPHYLOCOCCUS AUREUS: NEGATIVE

## 2016-04-21 NOTE — Pre-Procedure Instructions (Signed)
Stacie GlazeLinda J Vanwagoner  04/21/2016      [id number], [place] - [address] [place] [id number] Phone: [phone] Fax: [phone]  [facility] [place], [place] - [id number] AT Portal to Registered [facility] Sites [address] [place] [id number] Phone: [phone] Fax: [phone]  [facility] [place], [place] - [place] [place] [address] Phone: [phone] Fax: [phone]    Your procedure is scheduled on 04-27-2016   Wednesday   [facility] Admitting at 10:30 A.M.   Call this number if you have problems the morning of surgery:  [phone]   Remember:  Do not eat food or drink liquids after midnight.   Take these medicines the morning of surgery with A SIP OF WATER Atorvastatin(Lipitor),Restasis eye drops,diltiazem(Cardizem),Duloxetine(Cymbalta),nebivolol(Bystolic)   STOP ASPIRIN,ANTIINFLAMATORIES (IBUPROFEN,ALEVE,MOTRIN,ADVIL,GOODY'S POWDERS),HERBAL SUPPLEMENTS,FISH OIL,AND VITAMINS 5-7 DAYS PRIOR TO SURGERY                   How to Manage Your Diabetes Before and After Surgery  Why is it important to control my blood sugar before and after surgery? . Improving blood sugar levels before and after surgery helps healing and can limit problems. . A way of improving blood sugar control is eating a healthy diet by: o  Eating less sugar and carbohydrates o  Increasing activity/exercise o  Talking with your doctor about reaching your blood sugar goals . High blood sugars (greater than 180 mg/dL) can raise your risk of infections and slow your recovery, so you will need to focus on controlling your diabetes during the weeks before surgery. . Make sure that the doctor who takes care of your diabetes knows about your planned surgery including the date and location.  How do I manage my blood sugar before surgery? . Check your  blood sugar at least 4 times a day, starting 2 days before surgery, to make sure that the level is not too high or low. o Check your blood sugar the morning of your surgery when you wake up and every 2 hours until you get to the Short Stay unit. . If your blood sugar is less than 70 mg/dL, you will need to treat for low blood sugar: o Do not take insulin. o Treat a low blood sugar (less than 70 mg/dL) with  cup of clear juice (cranberry or apple), 4 glucose tablets, OR glucose gel. o Recheck blood sugar in 15 minutes after treatment (to make sure it is greater than 70 mg/dL). If your blood sugar is not greater than 70 mg/dL on recheck, call [phone] for further instructions. . Report your blood sugar to the short stay nurse when you get to Short Stay.  . If you are admitted to the hospital after surgery: o Your blood sugar will be checked by the staff and you will probably be given insulin after surgery (instead of oral diabetes medicines) to make sure you have good blood sugar levels. o The goal for blood sugar control after surgery is 80-180 mg/dL.              WHAT DO I DO ABOUT MY DIABETES MEDICATION?   Marland Kitchen. Do not take oral diabetes medicines (pills) the morning of surgery.  . THE NIGHT BEFORE SURGERY, take _30 units of Humilin_insulin.       Marland Kitchen. HE MORNING OF SURGERY, take _30_ units of  Humlin_insulin.  .Marland Kitchen  The day of surgery, do not take other diabetes injectables, including Byetta (exenatide), Bydureon (exenatide ER), Victoza (liraglutide), or Trulicity (dulaglutide).  . If your CBG is greater than 220 mg/dL, you may take  of your sliding scale (correction) dose of insulin.     Patient Signature:  Date:   Nurse Signature:  Date:   Reviewed and Endorsed by [facility] Patient Education Committee, August 2015      Do not wear jewelry, make-up or nail polish.  Do not wear lotions, powders, or perfumes, or deoderant.  Do not shave 48 hours prior to surgery.   .  Do not bring valuables to the hospital.  [facility] is not responsible for any belongings or valuables.  Contacts, dentures or bridgework may not be worn into surgery.  Leave your suitcase in the car.  After surgery it may be brought to your room.  For patients admitted to the hospital, discharge time will be determined by your treatment team.  Patients discharged the day of surgery will not be allowed to drive home.    Special instructions:  [facility] - Preparing for Surgery  Before surgery, you can play an important role.  Because skin is not sterile, your skin needs to be as free of germs as possible.  You can reduce the number of germs on you skin by washing with CHG (chlorahexidine gluconate) soap before surgery.  CHG is an antiseptic cleaner which kills germs and bonds with the skin to continue killing germs even after washing.  Please DO NOT use if you have an allergy to CHG or antibacterial soaps.  If your skin becomes reddened/irritated stop using the CHG and inform your nurse when you arrive at Short Stay.  Do not shave (including legs and underarms) for at least 48 hours prior to the first CHG shower.  You may shave your face.  Please follow these instructions carefully:   1.  Shower with CHG Soap the night before surgery and the                                morning of Surgery.  2.  If you choose to wash your hair, wash your hair first as usual with your       normal shampoo.  3.  After you shampoo, rinse your hair and body thoroughly to remove the                      Shampoo.  4.  Use CHG as you would any other liquid soap.  You can apply chg directly       to the skin and wash gently with scrungie or a clean washcloth.  5.  Apply the CHG Soap to your body ONLY FROM THE NECK DOWN.        Do not use on open wounds or open sores.  Avoid contact with your eyes, ears, mouth and genitals (private parts).  Wash genitals (private parts)  with your normal soap.  6.  Wash  thoroughly, paying special attention to the area where your surgery        will be performed.  7.  Thoroughly rinse your body with warm water from the neck down.  8.  DO NOT shower/wash with your normal soap after using and rinsing off       the CHG Soap.  9.  Pat yourself dry with a clean towel.  10.  Wear clean pajamas.            11.  Place clean sheets on your bed the night of your first shower and do not        sleep with pets.  Day of Surgery  Do not apply any lotions/deoderants the morning of surgery.  Please wear clean clothes to the hospital/surgery center.    Please read over the following fact sheets that you were given. MRSA, Incentive Spirometry

## 2016-04-22 LAB — HEMOGLOBIN A1C
HEMOGLOBIN A1C: 7 % — AB (ref 4.8–5.6)
MEAN PLASMA GLUCOSE: 154 mg/dL

## 2016-04-22 NOTE — Progress Notes (Signed)
Anesthesia chart review: Patient is a 71 year old female scheduled for C5-6, C6-7 ACDF on 04/27/2016 by Dr. Ophelia CharterYates.  History includes former smoker, OSA, HTN, rheumatic fever, murmur, atrial fibrillation, DVT/PE, chronotropic incompetence with sinus node dysfunction s/p dual chamber Medtronic PPM 01/18/07, left bundle branch block, GERD, hiatal hernia, diabetes mellitus type 2, carotid artery stenosis s/p left carotid endarterectomy 10/31/2014, vertigo, headaches, arthritis, anxiety, depression, asthma, right TKA.  - PCP is listed as Dr. Georgianne FickAjith Ramachandran. - Cardiologist is Dr. Verdis PrimeHenry Smith. Last visit with Cline CrockKathryn Thompson, PA-C on 03/17/16 for preoperative clearance. She wrote, "Pre op risk assessment: she has no chest pain or SOB. She is not very active and I am not sure she ever performs over 4 METS. Her Chales AbrahamsGupta perioperative risk for MI or Cardiac arrest is 1%. I have cleared her for surgery without further testing." - EP cardiologist is Dr. Graciela HusbandsKlein. Last EP visit was with Sherrilee Gillesenee Ursy, PA-C on 02/02/16. - Vascular surgeon is Dr. Myra GianottiBrabham. - Pulmonologist is Dr. Jetty Duhamellinton Young, last visit 02/26/15. She was not using her CPAP at that time.   Meds include aspirin 81 mg, Lipitor, Cardizem CD, Cymbalta, HCTZ, Bystolic, nitrofurantoin (to complete 04/25/16), fish oil, Zegerid, trazodone, Humulin N 60 Units BID, Novolog TID SSI.  BP (!) 141/45   Pulse 79   Temp 36.7 C   Resp 18   Ht 5\' 3"  (1.6 m)   Wt 152 lb 1.6 oz (69 kg)   SpO2 95%   BMI 26.94 kg/m   EKG 03/17/16: AV sequential or dual chamber electronic pacemaker.  Nuclear stress test 01/31/07: Conclusions: 1. No evidence of infarction or ischemia. 2. Normal wall motion with ejection fraction of 59%.  Cardiac cath 03/25/03: CONCLUSIONS: 1. Widely patent proximal coronaries. There is mild to moderate distal diffuse left anterior descending disease of the apex. 2. Normal left ventricular function. 3. Hypokalemia identified on pre cath blood  work. PLAN: K-Dur 40 mEq per day. Aggressive risk factor modification. No specific therapy for angina or ischemic symptoms is required as there appearto be no focal high grade obstructive lesions noted. The patient should beon aspirin per day as well.  CTA head/neck 02/03/16: IMPRESSION: 1. Unremarkable appearance of the intracranial arterial vasculature. 2. Focal calcified plaque at the right carotid bifurcation with 60% proximal ICA stenosis. 3. Widely patent vertebral arteries. 4. No acute intracranial abnormality or mass. 5. Mildly progressive cervical disc degeneration, most advanced at C5-6 and C6-7. Mild spinal and mild left foraminal stenosis at C5-6. 6. New mild left foraminal stenosis at C4-5.  Carotid U/S 08/17/15: Impression: Right 40-59% ICA stenosis. Left patent endarterectomy with no evidence of restenosis. Mild intimal hyperplasia noted at the proximal patch in the distal common carotid artery. Left external carotid artery stenosis.  CXR 10/21/15: IMPRESSION: No active cardiopulmonary disease.  Preoperative labs noted. Non-fasting glucose was 334, but A1c was 7.0, consistent with average glucose 154. CBC WNL. Urinalysis negative for nitrites and leukocytes. (I routed Dr. Ophelia CharterYates a message regarding non-fasting glucose and A1c results.) Patient will get a fasting CBG on arrival.  If day of surgery glucose acceptable and otherwise no acute changes then I would anticipate that she can proceed as planned. PPM perioperative RX form is still pending from [facility]. Nursing staff to follow-up.  Velna Ochsllison Jenet Durio, PA-C [facility]iology Phone [phone] 04/22/2016 6:36 PM

## 2016-04-26 NOTE — Progress Notes (Addendum)
Notified Marcia Medtronic Rep of patient surgery scheduled 1230pm 04-27-16.  Still waiting for ppm order to be faxed back.  refaxed ppm order form to [facility].

## 2016-04-27 ENCOUNTER — Inpatient Hospital Stay (HOSPITAL_COMMUNITY): Payer: Medicare Other | Admitting: Vascular Surgery

## 2016-04-27 ENCOUNTER — Observation Stay (HOSPITAL_COMMUNITY): Payer: Medicare Other

## 2016-04-27 ENCOUNTER — Encounter (HOSPITAL_COMMUNITY): Payer: Self-pay | Admitting: *Deleted

## 2016-04-27 ENCOUNTER — Observation Stay (HOSPITAL_COMMUNITY)
Admission: RE | Admit: 2016-04-27 | Discharge: 2016-04-28 | Disposition: A | Discharge status: Home / Self Care | Payer: Medicare Other | Source: Ambulatory Visit | Attending: Orthopaedic Surgery | Admitting: Orthopaedic Surgery

## 2016-04-27 ENCOUNTER — Inpatient Hospital Stay (HOSPITAL_COMMUNITY): Payer: Medicare Other | Admitting: Anesthesiology

## 2016-04-27 ENCOUNTER — Encounter (HOSPITAL_COMMUNITY)
Admission: RE | Disposition: A | Discharge status: Home / Self Care | Payer: Self-pay | Source: Ambulatory Visit | Attending: Orthopaedic Surgery

## 2016-04-27 DIAGNOSIS — Z7982 Long term (current) use of aspirin: Secondary | ICD-10-CM | POA: Insufficient documentation

## 2016-04-27 DIAGNOSIS — Z794 Long term (current) use of insulin: Secondary | ICD-10-CM | POA: Insufficient documentation

## 2016-04-27 DIAGNOSIS — M4722 Other spondylosis with radiculopathy, cervical region: Secondary | ICD-10-CM | POA: Diagnosis not present

## 2016-04-27 DIAGNOSIS — M797 Fibromyalgia: Secondary | ICD-10-CM | POA: Diagnosis not present

## 2016-04-27 DIAGNOSIS — I10 Essential (primary) hypertension: Secondary | ICD-10-CM | POA: Insufficient documentation

## 2016-04-27 DIAGNOSIS — Z881 Allergy status to other antibiotic agents status: Secondary | ICD-10-CM | POA: Diagnosis not present

## 2016-04-27 DIAGNOSIS — G4733 Obstructive sleep apnea (adult) (pediatric): Secondary | ICD-10-CM | POA: Insufficient documentation

## 2016-04-27 DIAGNOSIS — Z419 Encounter for procedure for purposes other than remedying health state, unspecified: Secondary | ICD-10-CM

## 2016-04-27 DIAGNOSIS — Z4889 Encounter for other specified surgical aftercare: Secondary | ICD-10-CM

## 2016-04-27 DIAGNOSIS — M50122 Cervical disc disorder at C5-C6 level with radiculopathy: Secondary | ICD-10-CM | POA: Insufficient documentation

## 2016-04-27 DIAGNOSIS — J45909 Unspecified asthma, uncomplicated: Secondary | ICD-10-CM | POA: Insufficient documentation

## 2016-04-27 DIAGNOSIS — Z91041 Radiographic dye allergy status: Secondary | ICD-10-CM | POA: Insufficient documentation

## 2016-04-27 DIAGNOSIS — K219 Gastro-esophageal reflux disease without esophagitis: Secondary | ICD-10-CM | POA: Diagnosis not present

## 2016-04-27 DIAGNOSIS — Z96659 Presence of unspecified artificial knee joint: Secondary | ICD-10-CM | POA: Insufficient documentation

## 2016-04-27 DIAGNOSIS — M47812 Spondylosis without myelopathy or radiculopathy, cervical region: Secondary | ICD-10-CM | POA: Diagnosis not present

## 2016-04-27 DIAGNOSIS — Z885 Allergy status to narcotic agent status: Secondary | ICD-10-CM | POA: Diagnosis not present

## 2016-04-27 DIAGNOSIS — Z950 Presence of cardiac pacemaker: Secondary | ICD-10-CM | POA: Insufficient documentation

## 2016-04-27 DIAGNOSIS — I2510 Atherosclerotic heart disease of native coronary artery without angina pectoris: Secondary | ICD-10-CM | POA: Insufficient documentation

## 2016-04-27 DIAGNOSIS — R011 Cardiac murmur, unspecified: Secondary | ICD-10-CM | POA: Insufficient documentation

## 2016-04-27 DIAGNOSIS — Z888 Allergy status to other drugs, medicaments and biological substances status: Secondary | ICD-10-CM | POA: Insufficient documentation

## 2016-04-27 DIAGNOSIS — E1151 Type 2 diabetes mellitus with diabetic peripheral angiopathy without gangrene: Secondary | ICD-10-CM | POA: Diagnosis not present

## 2016-04-27 DIAGNOSIS — Z880 Allergy status to penicillin: Secondary | ICD-10-CM | POA: Insufficient documentation

## 2016-04-27 DIAGNOSIS — E119 Type 2 diabetes mellitus without complications: Secondary | ICD-10-CM

## 2016-04-27 DIAGNOSIS — M4802 Spinal stenosis, cervical region: Secondary | ICD-10-CM | POA: Diagnosis present

## 2016-04-27 DIAGNOSIS — Z882 Allergy status to sulfonamides status: Secondary | ICD-10-CM | POA: Insufficient documentation

## 2016-04-27 DIAGNOSIS — I447 Left bundle-branch block, unspecified: Secondary | ICD-10-CM | POA: Diagnosis not present

## 2016-04-27 DIAGNOSIS — Z86711 Personal history of pulmonary embolism: Secondary | ICD-10-CM | POA: Diagnosis not present

## 2016-04-27 DIAGNOSIS — F329 Major depressive disorder, single episode, unspecified: Secondary | ICD-10-CM | POA: Insufficient documentation

## 2016-04-27 DIAGNOSIS — I4891 Unspecified atrial fibrillation: Secondary | ICD-10-CM | POA: Insufficient documentation

## 2016-04-27 DIAGNOSIS — Z981 Arthrodesis status: Secondary | ICD-10-CM | POA: Diagnosis not present

## 2016-04-27 DIAGNOSIS — Z8673 Personal history of transient ischemic attack (TIA), and cerebral infarction without residual deficits: Secondary | ICD-10-CM | POA: Diagnosis not present

## 2016-04-27 DIAGNOSIS — M1990 Unspecified osteoarthritis, unspecified site: Secondary | ICD-10-CM | POA: Insufficient documentation

## 2016-04-27 DIAGNOSIS — Z87891 Personal history of nicotine dependence: Secondary | ICD-10-CM | POA: Insufficient documentation

## 2016-04-27 DIAGNOSIS — R410 Disorientation, unspecified: Secondary | ICD-10-CM

## 2016-04-27 DIAGNOSIS — F418 Other specified anxiety disorders: Secondary | ICD-10-CM | POA: Diagnosis not present

## 2016-04-27 HISTORY — PX: ANTERIOR CERVICAL DECOMP/DISCECTOMY FUSION: SHX1161

## 2016-04-27 LAB — GLUCOSE, CAPILLARY
GLUCOSE-CAPILLARY: 197 mg/dL — AB (ref 65–99)
Glucose-Capillary: 384 mg/dL — ABNORMAL HIGH (ref 65–99)
Glucose-Capillary: 279 mg/dL — ABNORMAL HIGH (ref 65–99)
Glucose-Capillary: 193 mg/dL — ABNORMAL HIGH (ref 65–99)
Glucose-Capillary: 189 mg/dL — ABNORMAL HIGH (ref 65–99)
Glucose-Capillary: 143 mg/dL — ABNORMAL HIGH (ref 65–99)

## 2016-04-27 LAB — POCT I-STAT 4, (NA,K, GLUC, HGB,HCT)
Glucose, Bld: 269 mg/dL — ABNORMAL HIGH (ref 65–99)
HEMATOCRIT: 37 % (ref 36.0–46.0)
HEMOGLOBIN: 12.6 g/dL (ref 12.0–15.0)
Potassium: 3.8 mmol/L (ref 3.5–5.1)
SODIUM: 137 mmol/L (ref 135–145)

## 2016-04-27 SURGERY — ANTERIOR CERVICAL DECOMPRESSION/DISCECTOMY FUSION 2 LEVELS
Anesthesia: General | Site: Neck

## 2016-04-27 MED ORDER — PANTOPRAZOLE SODIUM 40 MG PO TBEC
40.0000 mg | DELAYED_RELEASE_TABLET | Freq: Every day | ORAL | Status: DC
  Administered 2016-04-28: 40 mg via ORAL
  Filled 2016-04-27: qty 1

## 2016-04-27 MED ORDER — SUGAMMADEX SODIUM 200 MG/2ML IV SOLN
INTRAVENOUS | Status: AC
  Filled 2016-04-27: qty 2

## 2016-04-27 MED ORDER — PHENOL 1.4 % MT LIQD: 1.0000 | OROMUCOSAL | Status: DC | PRN

## 2016-04-27 MED ORDER — ACETAMINOPHEN 650 MG RE SUPP: 650.0000 mg | RECTAL | Status: DC | PRN

## 2016-04-27 MED ORDER — 0.9 % SODIUM CHLORIDE (POUR BTL) OPTIME
TOPICAL | Status: DC | PRN
  Administered 2016-04-27: 1000 mL

## 2016-04-27 MED ORDER — DEXTROSE 5 % IV SOLN
500.0000 mg | Freq: Four times a day (QID) | INTRAVENOUS | Status: DC | PRN
  Filled 2016-04-27: qty 5

## 2016-04-27 MED ORDER — DULOXETINE HCL 60 MG PO CPEP
90.0000 mg | ORAL_CAPSULE | Freq: Every day | ORAL | Status: DC
  Administered 2016-04-28: 90 mg via ORAL
  Filled 2016-04-27: qty 1

## 2016-04-27 MED ORDER — ACETAMINOPHEN 325 MG PO TABS: 650.0000 mg | ORAL_TABLET | Freq: Four times a day (QID) | ORAL | Status: DC | PRN

## 2016-04-27 MED ORDER — LIDOCAINE 2% (20 MG/ML) 5 ML SYRINGE
INTRAMUSCULAR | Status: DC | PRN
  Administered 2016-04-27: 60 mg via INTRAVENOUS

## 2016-04-27 MED ORDER — SODIUM CHLORIDE 0.9 % IV SOLN
INTRAVENOUS | Status: DC
  Administered 2016-04-27: 19:00:00 via INTRAVENOUS

## 2016-04-27 MED ORDER — SODIUM CHLORIDE 0.9% FLUSH: 3.0000 mL | INTRAVENOUS | Status: DC | PRN

## 2016-04-27 MED ORDER — CLINDAMYCIN PHOSPHATE 900 MG/50ML IV SOLN
INTRAVENOUS | Status: AC
  Filled 2016-04-27: qty 50

## 2016-04-27 MED ORDER — HYDROCHLOROTHIAZIDE 25 MG PO TABS: 12.5000 mg | ORAL_TABLET | Freq: Every day | ORAL | Status: DC

## 2016-04-27 MED ORDER — MENTHOL 3 MG MT LOZG
1.0000 | LOZENGE | OROMUCOSAL | Status: DC | PRN
  Administered 2016-04-27: 3 mg via ORAL
  Filled 2016-04-27: qty 9

## 2016-04-27 MED ORDER — SODIUM CHLORIDE 0.9 % IV SOLN: 250.0000 mL | INTRAVENOUS | Status: DC

## 2016-04-27 MED ORDER — ONDANSETRON HCL 4 MG/2ML IJ SOLN: 4.0000 mg | Freq: Once | INTRAMUSCULAR | Status: DC | PRN

## 2016-04-27 MED ORDER — MEPERIDINE HCL 25 MG/ML IJ SOLN: 6.2500 mg | INTRAMUSCULAR | Status: DC | PRN

## 2016-04-27 MED ORDER — SODIUM CHLORIDE 0.9 % IJ SOLN
INTRAMUSCULAR | Status: AC
  Filled 2016-04-27: qty 10

## 2016-04-27 MED ORDER — EPINEPHRINE 0.3 MG/0.3ML IJ SOAJ
0.3000 mg | INTRAMUSCULAR | Status: DC | PRN
  Filled 2016-04-27: qty 0.3

## 2016-04-27 MED ORDER — EPINEPHRINE PF 1 MG/ML IJ SOLN
INTRAMUSCULAR | Status: AC
  Filled 2016-04-27: qty 1

## 2016-04-27 MED ORDER — PHENYLEPHRINE 40 MCG/ML (10ML) SYRINGE FOR IV PUSH (FOR BLOOD PRESSURE SUPPORT)
PREFILLED_SYRINGE | INTRAVENOUS | Status: AC
  Filled 2016-04-27: qty 20

## 2016-04-27 MED ORDER — HYDROMORPHONE HCL 1 MG/ML IJ SOLN
0.2500 mg | INTRAMUSCULAR | Status: DC | PRN
  Administered 2016-04-27 (×2): 0.5 mg via INTRAVENOUS

## 2016-04-27 MED ORDER — DILTIAZEM HCL ER COATED BEADS 120 MG PO CP24
120.0000 mg | ORAL_CAPSULE | Freq: Every day | ORAL | Status: DC
  Administered 2016-04-28: 120 mg via ORAL
  Filled 2016-04-27: qty 1

## 2016-04-27 MED ORDER — LACTATED RINGERS IV SOLN
INTRAVENOUS | Status: DC | PRN
  Administered 2016-04-27: 11:00:00 via INTRAVENOUS

## 2016-04-27 MED ORDER — SUGAMMADEX SODIUM 200 MG/2ML IV SOLN
INTRAVENOUS | Status: DC | PRN
  Administered 2016-04-27: 100 mg via INTRAVENOUS

## 2016-04-27 MED ORDER — INSULIN REGULAR BOLUS VIA INFUSION
0.0000 [IU] | Freq: Three times a day (TID) | INTRAVENOUS | Status: DC
  Filled 2016-04-27: qty 10

## 2016-04-27 MED ORDER — POLYETHYLENE GLYCOL 3350 17 G PO PACK
17.0000 g | PACK | Freq: Every day | ORAL | Status: DC
  Administered 2016-04-27 – 2016-04-28 (×2): 17 g via ORAL
  Filled 2016-04-27 (×2): qty 1

## 2016-04-27 MED ORDER — PROPOFOL 10 MG/ML IV BOLUS
INTRAVENOUS | Status: DC | PRN
  Administered 2016-04-27: 120 mg via INTRAVENOUS

## 2016-04-27 MED ORDER — SODIUM CHLORIDE 0.9% FLUSH: 3.0000 mL | Freq: Two times a day (BID) | INTRAVENOUS | Status: DC

## 2016-04-27 MED ORDER — SODIUM CHLORIDE 0.9 % IV SOLN
INTRAVENOUS | Status: DC
  Administered 2016-04-27: 14:00:00 via INTRAVENOUS

## 2016-04-27 MED ORDER — BUPIVACAINE-EPINEPHRINE 0.25% -1:200000 IJ SOLN
INTRAMUSCULAR | Status: DC | PRN
  Administered 2016-04-27: 5 mL

## 2016-04-27 MED ORDER — LIDOCAINE 2% (20 MG/ML) 5 ML SYRINGE
INTRAMUSCULAR | Status: AC
  Filled 2016-04-27: qty 5

## 2016-04-27 MED ORDER — HEMOSTATIC AGENTS (NO CHARGE) OPTIME
TOPICAL | Status: DC | PRN
  Administered 2016-04-27: 1 via TOPICAL

## 2016-04-27 MED ORDER — DILTIAZEM HCL ER COATED BEADS 120 MG PO CP24
120.0000 mg | ORAL_CAPSULE | Freq: Once | ORAL | Status: AC
  Administered 2016-04-27: 120 mg via ORAL
  Filled 2016-04-27: qty 1

## 2016-04-27 MED ORDER — CYCLOSPORINE 0.05 % OP EMUL
1.0000 [drp] | Freq: Two times a day (BID) | OPHTHALMIC | Status: DC
  Administered 2016-04-27 – 2016-04-28 (×2): 1 [drp] via OPHTHALMIC
  Filled 2016-04-27 (×2): qty 1

## 2016-04-27 MED ORDER — PHENYLEPHRINE 40 MCG/ML (10ML) SYRINGE FOR IV PUSH (FOR BLOOD PRESSURE SUPPORT)
PREFILLED_SYRINGE | INTRAVENOUS | Status: DC | PRN
  Administered 2016-04-27 (×4): 80 ug via INTRAVENOUS

## 2016-04-27 MED ORDER — HYDRALAZINE HCL 20 MG/ML IJ SOLN: 10.0000 mg | Freq: Four times a day (QID) | INTRAMUSCULAR | Status: DC | PRN

## 2016-04-27 MED ORDER — ROCURONIUM BROMIDE 10 MG/ML (PF) SYRINGE
PREFILLED_SYRINGE | INTRAVENOUS | Status: DC | PRN
  Administered 2016-04-27: 50 mg via INTRAVENOUS

## 2016-04-27 MED ORDER — ONDANSETRON HCL 4 MG/2ML IJ SOLN
INTRAMUSCULAR | Status: AC
  Filled 2016-04-27: qty 2

## 2016-04-27 MED ORDER — ACETAMINOPHEN 650 MG RE SUPP: 650.0000 mg | Freq: Four times a day (QID) | RECTAL | Status: DC | PRN

## 2016-04-27 MED ORDER — ONDANSETRON HCL 4 MG/2ML IJ SOLN
INTRAMUSCULAR | Status: DC | PRN
  Administered 2016-04-27: 4 mg via INTRAVENOUS

## 2016-04-27 MED ORDER — INSULIN ASPART 100 UNIT/ML ~~LOC~~ SOLN
0.0000 [IU] | Freq: Three times a day (TID) | SUBCUTANEOUS | Status: DC
  Administered 2016-04-28: 7 [IU] via SUBCUTANEOUS
  Administered 2016-04-28: 11 [IU] via SUBCUTANEOUS

## 2016-04-27 MED ORDER — INSULIN GLARGINE 100 UNIT/ML ~~LOC~~ SOLN
30.0000 [IU] | Freq: Every day | SUBCUTANEOUS | Status: DC
  Administered 2016-04-27: 30 [IU] via SUBCUTANEOUS
  Filled 2016-04-27: qty 0.3

## 2016-04-27 MED ORDER — HYDROMORPHONE HCL 2 MG/ML IJ SOLN: 0.5000 mg | INTRAMUSCULAR | Status: DC | PRN

## 2016-04-27 MED ORDER — NEBIVOLOL HCL 5 MG PO TABS
10.0000 mg | ORAL_TABLET | Freq: Two times a day (BID) | ORAL | Status: DC
Start: 2016-04-27 — End: 2016-04-28
  Administered 2016-04-27 – 2016-04-28 (×2): 10 mg via ORAL
  Filled 2016-04-27 (×2): qty 2

## 2016-04-27 MED ORDER — NEBIVOLOL HCL 10 MG PO TABS
10.0000 mg | ORAL_TABLET | Freq: Once | ORAL | Status: AC
  Administered 2016-04-27: 10 mg via ORAL
  Filled 2016-04-27: qty 1

## 2016-04-27 MED ORDER — ONDANSETRON HCL 4 MG/2ML IJ SOLN: 4.0000 mg | INTRAMUSCULAR | Status: DC | PRN

## 2016-04-27 MED ORDER — SUFENTANIL CITRATE 50 MCG/ML IV SOLN
INTRAVENOUS | Status: DC | PRN
  Administered 2016-04-27: 25 ug via INTRAVENOUS
  Administered 2016-04-27: 15 ug via INTRAVENOUS

## 2016-04-27 MED ORDER — SODIUM CHLORIDE 0.9 % IV SOLN
INTRAVENOUS | Status: DC
  Administered 2016-04-27: 4.4 [IU]/h via INTRAVENOUS
  Filled 2016-04-27: qty 2.5

## 2016-04-27 MED ORDER — BUPIVACAINE HCL (PF) 0.25 % IJ SOLN
INTRAMUSCULAR | Status: AC
  Filled 2016-04-27: qty 30

## 2016-04-27 MED ORDER — DOCUSATE SODIUM 100 MG PO CAPS
100.0000 mg | ORAL_CAPSULE | Freq: Two times a day (BID) | ORAL | Status: DC
  Administered 2016-04-27 – 2016-04-28 (×2): 100 mg via ORAL
  Filled 2016-04-27 (×2): qty 1

## 2016-04-27 MED ORDER — PROPOFOL 10 MG/ML IV BOLUS
INTRAVENOUS | Status: AC
  Filled 2016-04-27: qty 20

## 2016-04-27 MED ORDER — INSULIN ISOPHANE HUMAN 100 UNIT/ML KWIKPEN: 60.0000 [IU] | PEN_INJECTOR | Freq: Two times a day (BID) | SUBCUTANEOUS | Status: DC

## 2016-04-27 MED ORDER — INSULIN ASPART 100 UNIT/ML ~~LOC~~ SOLN: 0.0000 [IU] | Freq: Every day | SUBCUTANEOUS | Status: DC

## 2016-04-27 MED ORDER — ACETAMINOPHEN 325 MG PO TABS: 650.0000 mg | ORAL_TABLET | ORAL | Status: DC | PRN

## 2016-04-27 MED ORDER — TRAZODONE HCL 50 MG PO TABS
25.0000 mg | ORAL_TABLET | Freq: Every day | ORAL | Status: DC
  Administered 2016-04-27: 25 mg via ORAL
  Filled 2016-04-27: qty 1

## 2016-04-27 MED ORDER — DEXTROSE 50 % IV SOLN
25.0000 mL | INTRAVENOUS | Status: DC | PRN
  Filled 2016-04-27: qty 50

## 2016-04-27 MED ORDER — METHOCARBAMOL 500 MG PO TABS: 500.0000 mg | ORAL_TABLET | Freq: Four times a day (QID) | ORAL | Status: DC | PRN

## 2016-04-27 MED ORDER — CLINDAMYCIN PHOSPHATE 900 MG/50ML IV SOLN
INTRAVENOUS | Status: DC | PRN
  Administered 2016-04-27: 900 mg via INTRAVENOUS

## 2016-04-27 MED ORDER — SUFENTANIL CITRATE 50 MCG/ML IV SOLN
INTRAVENOUS | Status: AC
  Filled 2016-04-27: qty 1

## 2016-04-27 MED ORDER — ATORVASTATIN CALCIUM 20 MG PO TABS: 20.0000 mg | ORAL_TABLET | Freq: Every day | ORAL | Status: DC

## 2016-04-27 MED ORDER — INSULIN ASPART 100 UNIT/ML FLEXPEN
100.0000 [IU] | PEN_INJECTOR | Freq: Three times a day (TID) | SUBCUTANEOUS | Status: DC
Start: 2016-04-28 — End: 2016-04-27

## 2016-04-27 MED ORDER — HYDROCODONE-ACETAMINOPHEN 10-325 MG PO TABS
1.0000 | ORAL_TABLET | ORAL | Status: DC | PRN
  Administered 2016-04-28: 1 via ORAL
  Filled 2016-04-27: qty 1

## 2016-04-27 MED ORDER — HYDROMORPHONE HCL 2 MG/ML IJ SOLN
INTRAMUSCULAR | Status: AC
  Filled 2016-04-27: qty 1

## 2016-04-27 SURGICAL SUPPLY — 56 items
BENZOIN TINCTURE PRP APPL 2/3 (GAUZE/BANDAGES/DRESSINGS) ×3 IMPLANT
BIT DRILL SKYLINE 12MM (BIT) ×1 IMPLANT
BLADE SURG ROTATE 9660 (MISCELLANEOUS) IMPLANT
BONE CERV LORDOTIC 14.5X12X6 (Bone Implant) ×3 IMPLANT
BONE CERV LORDOTIC 14.5X12X7 (Bone Implant) ×3 IMPLANT
BUR ROUND FLUTED 4 SOFT TCH (BURR) ×2 IMPLANT
BUR ROUND FLUTED 4MM SOFT TCH (BURR) ×1
CLOSURE WOUND 1/2 X4 (GAUZE/BANDAGES/DRESSINGS) ×1
COLLAR CERV LO CONTOUR FIRM DE (SOFTGOODS) ×3 IMPLANT
CORDS BIPOLAR (ELECTRODE) ×3 IMPLANT
COVER MAYO STAND STRL (DRAPES) ×3 IMPLANT
COVER SURGICAL LIGHT HANDLE (MISCELLANEOUS) ×3 IMPLANT
CRADLE DONUT ADULT HEAD (MISCELLANEOUS) ×3 IMPLANT
DRAPE C-ARM 42X72 X-RAY (DRAPES) ×3 IMPLANT
DRAPE MICROSCOPE LEICA (MISCELLANEOUS) ×3 IMPLANT
DRAPE PROXIMA HALF (DRAPES) ×3 IMPLANT
DRILL BIT SKYLINE 12MM (BIT) ×2
DURAPREP 6ML APPLICATOR 50/CS (WOUND CARE) ×3 IMPLANT
ELECT COATED BLADE 2.86 ST (ELECTRODE) ×3 IMPLANT
ELECT REM PT RETURN 9FT ADLT (ELECTROSURGICAL) ×3
ELECTRODE REM PT RTRN 9FT ADLT (ELECTROSURGICAL) ×1 IMPLANT
EVACUATOR 1/8 PVC DRAIN (DRAIN) ×3 IMPLANT
GAUZE SPONGE 4X4 12PLY STRL (GAUZE/BANDAGES/DRESSINGS) ×6 IMPLANT
GLOVE BIOGEL PI IND STRL 8 (GLOVE) ×2 IMPLANT
GLOVE BIOGEL PI INDICATOR 8 (GLOVE) ×4
GLOVE ORTHO TXT STRL SZ7.5 (GLOVE) ×6 IMPLANT
GLOVE SURG SS PI 6.5 STRL IVOR (GLOVE) ×6 IMPLANT
GOWN STRL REUS W/ TWL LRG LVL3 (GOWN DISPOSABLE) ×2 IMPLANT
GOWN STRL REUS W/ TWL XL LVL3 (GOWN DISPOSABLE) ×1 IMPLANT
GOWN STRL REUS W/TWL 2XL LVL3 (GOWN DISPOSABLE) ×3 IMPLANT
GOWN STRL REUS W/TWL LRG LVL3 (GOWN DISPOSABLE) ×4
GOWN STRL REUS W/TWL XL LVL3 (GOWN DISPOSABLE) ×2
HEAD HALTER (SOFTGOODS) ×3 IMPLANT
HEMOSTAT SURGICEL 2X14 (HEMOSTASIS) ×3 IMPLANT
KIT BASIN OR (CUSTOM PROCEDURE TRAY) ×3 IMPLANT
KIT ROOM TURNOVER OR (KITS) ×3 IMPLANT
MANIFOLD NEPTUNE II (INSTRUMENTS) IMPLANT
NEEDLE 25GX 5/8IN NON SAFETY (NEEDLE) ×6 IMPLANT
NS IRRIG 1000ML POUR BTL (IV SOLUTION) ×3 IMPLANT
PACK ORTHO CERVICAL (CUSTOM PROCEDURE TRAY) ×3 IMPLANT
PAD ARMBOARD 7.5X6 YLW CONV (MISCELLANEOUS) ×6 IMPLANT
PATTIES SURGICAL .5 X.5 (GAUZE/BANDAGES/DRESSINGS) ×3 IMPLANT
PIN TEMP SKYLINE THREADED (PIN) ×3 IMPLANT
PLATE SKYLINE TWO LEVEL 28MM (Plate) ×3 IMPLANT
RESTRAINT LIMB HOLDER UNIV (RESTRAINTS) ×3 IMPLANT
SCREW VARIABLE SELF TAP 12MM (Screw) ×18 IMPLANT
SPONGE GAUZE 4X4 12PLY STER LF (GAUZE/BANDAGES/DRESSINGS) ×3 IMPLANT
STRIP CLOSURE SKIN 1/2X4 (GAUZE/BANDAGES/DRESSINGS) ×2 IMPLANT
SURGIFLO W/THROMBIN 8M KIT (HEMOSTASIS) ×3 IMPLANT
SUT BONE WAX W31G (SUTURE) ×3 IMPLANT
SUT VIC AB 3-0 X1 27 (SUTURE) ×3 IMPLANT
SUT VICRYL 4-0 PS2 18IN ABS (SUTURE) ×6 IMPLANT
SYR BULB IRRIGATION 50ML (SYRINGE) ×3 IMPLANT
TOWEL OR 17X24 6PK STRL BLUE (TOWEL DISPOSABLE) IMPLANT
TOWEL OR 17X26 10 PK STRL BLUE (TOWEL DISPOSABLE) ×3 IMPLANT
TRAY FOLEY CATH 16FR SILVER (SET/KITS/TRAYS/PACK) IMPLANT

## 2016-04-27 NOTE — H&P (Signed)
Stacie GlazeLinda J Smelcer is an 71 y.o. female.   Chief Complaint: Neck pain and upper extremity radiculopathy. HPI:  Patient with history of C5-6 and C6-7 stenosis/HNP presents with the above complaint. Progressively worsening symptoms. Failed conservative treatment.  Past Medical History:  Diagnosis Date  . Allergic rhinitis, cause unspecified   . Anxiety state, unspecified   . Arthritis   . Atrial fibrillation (HCC)   . Bronchitis, not specified as acute or chronic   . Cardiac pacemaker medtronic   . 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 Depressive disorder, not elsewhere classified   . Disorders of sacrum   . GERD (gastroesophageal reflux disease)   . Headache(784.0)    history of  . Heart murmur   . History of hiatal hernia   . Hypertension   . LBBB (left bundle branch block)   . Myalgia and myositis, unspecified   . Obstructive sleep apnea (adult) (pediatric)   . Other voice and resonance disorders   . Pain in joint, lower leg    bilateral  . Presence of permanent cardiac pacemaker   . Pulmonary embolism (HCC)   . Rash, skin 2 weeks per patient   right arm, inner aspect    . Rheumatic fever in pediatric patient   . Total knee replacement status   . Type II or unspecified type diabetes mellitus without mention of complication, not stated as uncontrolled    Type II  . Unspecified asthma(493.90)   . 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 Vertigo    Meclizine (Antivert)    Past Surgical History:  Procedure Laterality Date  . ABDOMINAL HYSTERECTOMY    . BALLOON DILATION N/A 06/26/2013   Procedure: BALLOON DILATION;  Surgeon: Vertell NovakJames L Edwards Jr., MD;  [facility];  Service: Endoscopy;  Laterality: N/A;  . BILATERAL SALPINGOOPHORECTOMY  1991  . CHOLECYSTECTOMY    . COLONOSCOPY N/A 06/26/2013   Procedure: COLONOSCOPY;  Surgeon: Vertell NovakJames L Edwards Jr., MD;  Location[facility];  Service:  Endoscopy;  Laterality: N/A;  . ELBOW SURGERY Bilateral   . ENDARTERECTOMY Left 10/31/2014   Procedure: LEFT CAROTID ENDARTERECTOMY WITH BOVINE PERICARDIAL PATCH ANGIOPLASTY;  Surgeon: Nada LibmanVance W Brabham, MD;  Location: [facility];  Service: Vascular;  Laterality: Left;  . ESOPHAGOGASTRODUODENOSCOPY  11/02/2011   Procedure: ESOPHAGOGASTRODUODENOSCOPY (EGD);  Surgeon: Vertell NovakJames L Edwards Jr., MD;  Location[facility];  Service: Endoscopy;  Laterality: N/A;  with xray  . ESOPHAGOGASTRODUODENOSCOPY N/A 06/26/2013   Procedure: ESOPHAGOGASTRODUODENOSCOPY (EGD);  Surgeon: Vertell NovakJames L Edwards Jr., MD;  Location[facility];  Service: Endoscopy;  Laterality: N/A;  . INSERT / REPLACE / REMOVE PACEMAKER     insertion 2007  . NASAL FRACTURE SURGERY    . SAVORY DILATION  11/02/2011   Procedure: SAVORY DILATION;  Surgeon: Vertell NovakJames L Edwards Jr., MD;  Location[facility];  Service: Endoscopy;  Laterality: N/A;  . TEMPOROMANDIBULAR JOINT ARTHROPLASTY  1982  . TOTAL KNEE ARTHROPLASTY  2001, 2010, 2012   right  . total knee repair  2010   right  . VESICOVAGINAL FISTULA CLOSURE W/ TAH  1974    Family History  Problem Relation Age of Onset  . Cancer Father     LUNG  . Alcohol abuse Father   . Diabetes Mother   . Heart disease Mother   . Stroke Mother   . Heart attack Mother   . Hypertension Mother   . Deep vein thrombosis  Mother   . Hyperlipidemia Mother   . Varicose Veins Mother   . Peripheral vascular disease Mother     amputation  . Lupus Sister   . Diabetes Sister   . Aneurysm Sister   . Deep vein thrombosis Sister   . Hyperlipidemia Sister   . Hypertension Sister   . Varicose Veins Sister   . Heart disease Sister   . Deep vein thrombosis Sister   . Diabetes Sister   . Hyperlipidemia Sister   . Hypertension Sister   . Varicose Veins Sister    Social History:  reports that she quit smoking about 11 years ago. Her smoking use included Cigarettes. She has a 6.00 pack-year smoking history. She has  never used smokeless tobacco. She reports that she does not drink alcohol or use drugs.  Allergies:  Allergies  Allergen Reactions  . Cephalexin Swelling    Tongue and lip swelling  . Bactrim [Sulfamethoxazole-Trimethoprim] Nausea And Vomiting  . Penicillins Nausea And Vomiting and Swelling    Oral Cephalosporins cause Tongue and Lip Swelling  . Atenolol     Light headed/hair loss  . Chromic Sulfate Other (See Comments)    Chromic sutures--arm swells  . Codeine Nausea Only  . Contrast Media [Iodinated Diagnostic Agents] Other (See Comments)    Lips felt like on fire, rash, itching, lip peeling  . Desmopressin Other (See Comments)    Patient is unsure of reaction  . Dexilant [Dexlansoprazole] Diarrhea  . Doxycycline Nausea And Vomiting  . Entex     REACTION: unknown  . Marcelline Deist [Dapagliflozin] Other (See Comments)    Patient is unsure of reaction  . Gabapentin     REACTION: unknown  . Levaquin [Levofloxacin] Other (See Comments)    GI upset  . Lexapro [Escitalopram] Other (See Comments)    Weight gain  . Lisinopril Other (See Comments)    Unsure of reaction.  . Losartan Potassium Other (See Comments)  . Metformin     REACTION: unknown  . Other     Bellaryl  . Pregabalin Other (See Comments)    REACTION: unknown  . Rosiglitazone Maleate Other (See Comments)    REACTION: unknown  . Simvastatin Other (See Comments)    Muscle aches.  . Sitagliptin Other (See Comments)    Muscle ache  . Tradjenta [Linagliptin] Other (See Comments)    Joint pain    Medications Prior to Admission  Medication Sig Dispense Refill  . aspirin EC 81 MG tablet Take 81 mg by mouth daily.    Marland Kitchen atorvastatin (LIPITOR) 20 MG tablet Take 20 mg by mouth daily.      . BD PEN NEEDLE NANO U/F 32G X 4 MM MISC Use as directed for Insulin Injections    . cycloSPORINE (RESTASIS) 0.05 % ophthalmic emulsion Place 1 drop into both eyes 2 (two) times daily.    Marland Kitchen diltiazem (CARDIZEM CD) 120 MG 24 hr capsule  Take 1 capsule (120 mg total) by mouth daily. 90 capsule 4  . DULoxetine (CYMBALTA) 30 MG capsule Take 90 mg by mouth daily.     Marland Kitchen EPINEPHrine 0.3 mg/0.3 mL IJ SOAJ injection Inject 0.3 mg into the muscle as needed (FOR ALLERGIES).    Marland Kitchen HUMULIN N KWIKPEN 100 UNIT/ML Kiwkpen Inject 60 Units into the skin 2 (two) times daily.    . hydrochlorothiazide (HYDRODIURIL) 25 MG tablet Take 0.5 tablets (12.5 mg total) by mouth daily. 45 tablet 11  . nebivolol (BYSTOLIC) 10 MG tablet TAKE 1  TABLET BY MOUTH TWICE A DAY. (Patient taking differently: Take 10 mg by mouth 2 (two) times daily. ) 180 tablet 3  . NOVOLOG FLEXPEN 100 UNIT/ML FlexPen Inject into the skin 3 (three) times daily with meals. Sliding scale    . Omega-3 Fatty Acids (FISH OIL) 1200 MG CAPS Take 2,400 mg by mouth daily.     Marland Kitchen omeprazole-sodium bicarbonate (ZEGERID) 40-1100 MG per capsule Take 1 capsule by mouth at bedtime.      Letta Pate VERIO test strip 1 each by Other route 2 (two) times daily.     . polyethylene glycol powder (GLYCOLAX/MIRALAX) powder Take 17 g by mouth daily as needed. For constipation.    . traZODone (DESYREL) 50 MG tablet Take 25 mg by mouth at bedtime.      Results for orders placed or performed during the hospital encounter of 04/27/16 (from the past 48 hour(s))  Glucose, capillary     Status: Abnormal   Collection Time: 04/27/16 10:45 AM  Result Value Ref Range   Glucose-Capillary 384 (H) 65 - 99 mg/dL   No results found.  Review of Systems  Constitutional: Negative.   HENT: Negative.   Cardiovascular: Negative.   Genitourinary: Negative.   Musculoskeletal: Positive for neck pain.  Skin: Negative.   Neurological: Positive for tingling.  Psychiatric/Behavioral: Negative.     Blood pressure (!) 170/78, pulse 78, temperature 98.6 F (37 C), temperature source Oral, resp. rate 18, height 5\' 3"  (1.6 m), weight 152 lb 1.6 oz (69 kg), SpO2 95 %. Physical Exam  Constitutional: She is oriented to person,  place, and time. No distress.  HENT:  Head: Normocephalic and atraumatic.  Eyes: Pupils are equal, round, and reactive to light.  Neck: Normal range of motion.  Respiratory: No respiratory distress.  GI: She exhibits no distension.  Neurological: She is alert and oriented to person, place, and time.  Skin: Skin is warm and dry.  Psychiatric: She has a normal mood and affect.    CT CERVICAL SPINE FINDINGS  Vertebral alignment is normal. No fracture or destructive osseous lesion is identified. Moderate disc space narrowing is again seen at C5-6 and C6-7 with progressive degenerative endplate changes since 2008. Paraspinal soft tissues are unremarkable.  C2-3: Mild right facet arthrosis and minimal right uncovertebral spurring without stenosis, unchanged from 2008.  C3-4: New mild disc bulging with annular calcification and mild left facet arthrosis without stenosis.  C4-5: Minimal disc bulging, minimal uncovertebral spurring, and progressive moderate left facet arthrosis result in new mild left neural foraminal stenosis without spinal stenosis.  C5-6: Broad-based posterior disc osteophyte complex results in mild spinal stenosis and mild left neural foraminal stenosis, similar to prior.  C6-7: Progressive disc degeneration. Broad-based posterior disc osteophyte complex without significant stenosis.  C7-T1:  Mild left facet arthrosis without stenosis.  Review of the MIP images confirms the above findings  IMPRESSION: 1. Unremarkable appearance of the intracranial arterial vasculature. 2. Focal calcified plaque at the right carotid bifurcation with 60% proximal ICA stenosis. 3. Widely patent vertebral arteries. 4. No acute intracranial abnormality or mass. 5. Mildly progressive cervical disc degeneration, most advanced at C5-6 and C6-7. Mild spinal and mild left foraminal stenosis at C5-6. 6. New mild left foraminal stenosis at C4-5.   Electronically Signed    By: Sebastian Ache M.D.   On: 02/03/2016 17:38 Assessment/Plan C5-6 and C6-7 HNP/stenosis  We'll proceed with C5-6, C6-7 Anterior Cervical Discectomy and Fusion, Allograft, Plate as scheduled. Surgical procedure along with  possible rehabilitation/recovery time discussed. All questions answered.  Zonia Kief, PA-C 04/27/2016, 11:17 AM

## 2016-04-27 NOTE — Op Note (Signed)
Preop diagnosis: C5-6, C6-7 spondylosis  Procedure: C5-6, C6-7 anterior cervical discectomy and fusion, allograft and plate.  Surgeon: Annell GreeningMark Pacer Dorn M.D.  Assistant: Zonia KiefJames Owens PA-C medically necessary and present for the entire procedure  Anesthesia general oral tracheal intubated with glide scope  EBL: Minimal  Implants: Depuy skyline plate 28 mm. 12 mm screws 6 the G2 allograft cortical cancellus 6 mm at C5-6 and 7 mm at C6-7  Procedure: After induction general anesthesia operculum intubation standard prepping with DuraPrep sterile Mayo stand at the head externally sheets and drapes with the had halter traction without weight area squared with towels Betadine Steri-Drape applied after sterile skin marker. Patient had previous left carotid incision and the transverse incision lateral one fourth cross this. Incision was made and a prominent skin fold thyroid sticky drape was applied. Timeout procedure was completed clindamycin was given. Incision was made starting the midline extending the left platysma was divided in line with the fibers small veins were coagulated with the bipolar cautery and regular cautery. Carotid sheath contents lateral and esophagus and trachea toward the midline. Longus Coley and prominent spur C5-6 C6-7 were noted. Short 25 needle placed in the upper level C5-6 per pulled down with risk traction underneath the drape by the circulator and crosstable lateral C-arm after it was draped confirmed it was at C5-6 level.  Discectomy was performed upper microscope was brought in posterior longitudinal ligament was taken down large spurs removed as well as disc material removed off the dura for decompression trial sizing shows a 6 mm gave good fit graft was marked countersunk 2 mm. Some surgical flow was inserted just prior to placing the graft field was dry. Identical procedure performed at C6-7 at this level a 7 mm graft gave better fit. There is 1 mm space posteriorly's a large  spurs that were decompressed and removed. Uncovertebral joints were stripped and some millimeter graft was placed. Above plate was selected held with a spike adjusted under C-arm control a good AP and lateral and then 12 mm screws were placed. Checked under C-arm good position screws were locked in Hemovac drain placed 3-0 Vicryl sutures were placed and the platysma layer and then subcuticular 4-0 Vicryl closure. Tincture benzoin Steri-Strips the 4 x 4's tape and soft collar was applied patient tolerated the procedure well transferred recovery room in stable condition.

## 2016-04-27 NOTE — Transfer of Care (Signed)
Immediate Anesthesia Transfer of Care Note  Patient: Stacie GlazeLinda J Breon  Procedure(s) Performed: Procedure(s): C5-6, C6-7 Anterior Cervical Discectomy and Fusion, Allograft, Plate (N/A)  Patient Location: PACU  Anesthesia Type:General  Level of Consciousness: awake, oriented and patient cooperative  Airway & Oxygen Therapy: Patient Spontanous Breathing and Patient connected to face mask oxygen  Post-op Assessment: Report given to RN, Post -op Vital signs reviewed and stable and Patient moving all extremities  Post vital signs: Reviewed  Last Vitals:  Vitals:   04/27/16 1047  BP: (!) 170/78  Pulse: 78  Resp: 18  Temp: 37 C    Last Pain:  Vitals:   04/27/16 1047  TempSrc: Oral      Patients Stated Pain Goal: 2 (04/27/16 1051)  Complications: No apparent anesthesia complications

## 2016-04-27 NOTE — Anesthesia Postprocedure Evaluation (Signed)
Anesthesia Post Note  Patient: [name]  Procedure(s) Performed: Procedure(s) (LRB): C5-6, C6-7 Anterior Cervical Discectomy and Fusion, Allograft, Plate (N/A)  Patient location during evaluation: PACU Anesthesia Type: General Level of consciousness: awake and alert Pain management: pain level controlled Vital Signs Assessment: post-procedure vital signs reviewed and stable Respiratory status: spontaneous breathing, nonlabored ventilation, respiratory function stable and patient connected to nasal cannula oxygen Cardiovascular status: blood pressure returned to baseline and stable Postop Assessment: no signs of nausea or vomiting Anesthetic complications: no       Last Vitals:  Vitals:   04/27/16 1700 04/27/16 1715  BP: (!) 168/89 (!) 155/51  Pulse:    Resp:    Temp:      Last Pain:  Vitals:   04/27/16 1715  TempSrc:   PainSc: Asleep                 Keondre Markson DAVID

## 2016-04-27 NOTE — Anesthesia Procedure Notes (Signed)
Procedure Name: Intubation Date/Time: 04/27/2016 2:00 PM Performed by: Charm BargesBUTLER, Brett Soza R Pre-anesthesia Checklist: Patient identified, Emergency Drugs available, Suction available and Patient being monitored Patient Re-evaluated:Patient Re-evaluated prior to inductionOxygen Delivery Method: Circle System Utilized Preoxygenation: Pre-oxygenation with 100% oxygen Intubation Type: IV induction Ventilation: Mask ventilation without difficulty and Oral airway inserted - appropriate to patient size Laryngoscope Size: Glidescope and 3 (Elective glidescope ) Grade View: Grade I Tube type: Oral Tube size: 7.5 mm Number of attempts: 1 Airway Equipment and Method: Stylet and Oral airway Placement Confirmation: ETT inserted through vocal cords under direct vision,  positive ETCO2 and breath sounds checked- equal and bilateral Secured at: 22 cm Tube secured with: Tape Dental Injury: Teeth and Oropharynx as per pre-operative assessment  Difficulty Due To: Difficulty was anticipated, Difficult Airway- due to reduced neck mobility and Difficult Airway- due to limited oral opening

## 2016-04-27 NOTE — Consult Note (Addendum)
Patient Demographics  [name], is a 71 y.o. female   MRN: [id number]   DOB - 01-11-1946  Admit Date - 04/27/2016    Outpatient Primary MD for the patient is Georgianne Fick, MD  Consult requested in the Hospital by Eldred Manges, MD, On 04/27/2016    Reason for consult for post op diabetes management   With History of -  Past Medical History:  Diagnosis Date  . Allergic rhinitis, cause unspecified   . Anxiety state, unspecified   . Arthritis   . Atrial fibrillation (HCC)   . Bronchitis, not specified as acute or chronic   . Cardiac pacemaker medtronic   . 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 Depressive disorder, not elsewhere classified   . Disorders of sacrum   . GERD (gastroesophageal reflux disease)   . Headache(784.0)    history of  . Heart murmur   . History of hiatal hernia   . Hypertension   . LBBB (left bundle branch block)   . Myalgia and myositis, unspecified   . Obstructive sleep apnea (adult) (pediatric)   . Other voice and resonance disorders   . Pain in joint, lower leg    bilateral  . Presence of permanent cardiac pacemaker   . Pulmonary embolism (HCC)   . Rash, skin 2 weeks per patient   right arm, inner aspect    . Rheumatic fever in pediatric patient   . Total knee replacement status   . Type II or unspecified type diabetes mellitus without mention of complication, not stated as uncontrolled    Type II  . Unspecified asthma(493.90)   . 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 Vertigo    Meclizine (Antivert)      Past Surgical History:  Procedure Laterality Date  . ABDOMINAL HYSTERECTOMY    . BALLOON DILATION N/A 06/26/2013   Procedure: BALLOON DILATION;  Surgeon: Vertell Novak., MD;  Location: [facility];  Service: Endoscopy;  Laterality: N/A;  . BILATERAL  SALPINGOOPHORECTOMY  1991  . CHOLECYSTECTOMY    . COLONOSCOPY N/A 06/26/2013   Procedure: COLONOSCOPY;  Surgeon: Vertell Novak., MD;  Location: [facility];  Service: Endoscopy;  Laterality: N/A;  . ELBOW SURGERY Bilateral   . ENDARTERECTOMY Left 10/31/2014   Procedure: LEFT CAROTID ENDARTERECTOMY WITH BOVINE PERICARDIAL PATCH ANGIOPLASTY;  Surgeon: Nada Libman, MD;  Location: [facility];  Service: Vascular;  Laterality: Left;  . ESOPHAGOGASTRODUODENOSCOPY  11/02/2011   Procedure: ESOPHAGOGASTRODUODENOSCOPY (EGD);  Surgeon: Vertell Novak., MD;  Location: [facility];  Service: Endoscopy;  Laterality: N/A;  with xray  . ESOPHAGOGASTRODUODENOSCOPY N/A 06/26/2013   Procedure: ESOPHAGOGASTRODUODENOSCOPY (EGD);  Surgeon: Vertell Novak., MD;  Location: [facility];  Service: Endoscopy;  Laterality: N/A;  . INSERT / REPLACE / REMOVE PACEMAKER     insertion 2007  . NASAL FRACTURE SURGERY    . SAVORY DILATION  11/02/2011   Procedure: SAVORY  DILATION;  Surgeon: Vertell NovakJames L Edwards Jr., MD;  [facility];  Service: Endoscopy;  Laterality: N/A;  . TEMPOROMANDIBULAR JOINT ARTHROPLASTY  1982  . TOTAL KNEE ARTHROPLASTY  2001, 2010, 2012   right  . total knee repair  2010   right  . VESICOVAGINAL FISTULA CLOSURE W/ TAH  1974    in for Cervical Spondylosis C5-6, C6-7. S/p C5-6, C6-7 Anterior Cervical Discectomy and Fusion, Allograft, Plate by Dr Ophelia CharterYates on 2/21  No chief complaint on file.    HPI  [name] ParkinsonLinda [name]  is a 71 y.o. female, with h/o insulin dependent dm2, sinus node dysfunction w/ MDT PPM, CAD, DVT w/ PE remotely(no longer on warfarin), remote history of PAFib, no recurrent afib since device placment , although high CHADS score, she is not on anticoagulation per recent cardiology note in 03/2016, she also has h/o CVA and PVD s/p left CEA 10/2014  She has elective surgery for cervical spondylosis, she has preop cardiac eval in 03/2016, today , she underwent surgery, post  operatively, she has elevated blood sugar in the 300's, she is started on insulin drip in the PACU, hospitalist consulted for post op diabetes management    Review of Systems    In addition to the HPI above,  No Fever-chills, No Headache, No changes with Vision or hearing, No problems swallowing food or Liquids, No Chest pain, Cough or Shortness of Breath, No Abdominal pain, No Nausea or Vommitting, Bowel movements are regular, No Blood in stool or Urine, No dysuria, No new skin rashes or bruises, No new joints pains-aches,  No new weakness, tingling, numbness in any extremity, No recent weight gain or loss, No polyuria, polydypsia or polyphagia, No significant Mental Stressors.  A full 10 point Review of Systems was done, except as stated above, all other Review of Systems were negative.   Social History Social History  Substance Use Topics  . Smoking status: Former Smoker    Packs/day: 0.30    Years: 20.00    Types: Cigarettes    Quit date: 2007  . Smokeless tobacco: Never Used     Comment: pt smoked less than 1/4 ppd  . Alcohol use No     Family History Family History  Problem Relation Age of Onset  . Cancer Father     LUNG  . Alcohol abuse Father   . Diabetes Mother   . Heart disease Mother   . Stroke Mother   . Heart attack Mother   . Hypertension Mother   . Deep vein thrombosis Mother   . Hyperlipidemia Mother   . Varicose Veins Mother   . Peripheral vascular disease Mother     amputation  . Lupus Sister   . Diabetes Sister   . Aneurysm Sister   . Deep vein thrombosis Sister   . Hyperlipidemia Sister   . Hypertension Sister   . Varicose Veins Sister   . Heart disease Sister   . Deep vein thrombosis Sister   . Diabetes Sister   . Hyperlipidemia Sister   . Hypertension Sister   . Varicose Veins Sister      Prior to Admission medications   Medication Sig Start Date End Date Taking? Authorizing Provider  aspirin EC 81 MG tablet Take 81 mg by  mouth daily.   Yes Historical Provider, MD  atorvastatin (LIPITOR) 20 MG tablet Take 20 mg by mouth daily.     Yes Historical Provider, MD  BD PEN NEEDLE NANO U/F 32G X 4 MM  MISC Use as directed for Insulin Injections 07/01/11  Yes Historical Provider, MD  cycloSPORINE (RESTASIS) 0.05 % ophthalmic emulsion Place 1 drop into both eyes 2 (two) times daily.   Yes Historical Provider, MD  diltiazem (CARDIZEM CD) 120 MG 24 hr capsule Take 1 capsule (120 mg total) by mouth daily. 02/02/16  Yes Renee Norberto Sorenson, PA-C  DULoxetine (CYMBALTA) 30 MG capsule Take 90 mg by mouth daily.    Yes Historical Provider, MD  EPINEPHrine 0.3 mg/0.3 mL IJ SOAJ injection Inject 0.3 mg into the muscle as needed (FOR ALLERGIES).   Yes Historical Provider, MD  HUMULIN N KWIKPEN 100 UNIT/ML Kiwkpen Inject 60 Units into the skin 2 (two) times daily. 02/16/16  Yes Historical Provider, MD  hydrochlorothiazide (HYDRODIURIL) 25 MG tablet Take 0.5 tablets (12.5 mg total) by mouth daily. 04/27/15  Yes Duke Salvia, MD  nebivolol (BYSTOLIC) 10 MG tablet TAKE 1 TABLET BY MOUTH TWICE A DAY. Patient taking differently: Take 10 mg by mouth 2 (two) times daily.  03/17/16  Yes Janetta Hora, PA-C  NOVOLOG FLEXPEN 100 UNIT/ML FlexPen Inject into the skin 3 (three) times daily with meals. Sliding scale 12/09/15  Yes Historical Provider, MD  Omega-3 Fatty Acids (FISH OIL) 1200 MG CAPS Take 2,400 mg by mouth daily.    Yes Historical Provider, MD  omeprazole-sodium bicarbonate (ZEGERID) 40-1100 MG per capsule Take 1 capsule by mouth at bedtime.     Yes Historical Provider, MD  [facility] VERIO test strip 1 each by Other route 2 (two) times daily.  12/15/15  Yes Historical Provider, MD  polyethylene glycol powder (GLYCOLAX/MIRALAX) powder Take 17 g by mouth daily as needed. For constipation. 01/17/16  Yes Historical Provider, MD  traZODone (DESYREL) 50 MG tablet Take 25 mg by mouth at bedtime.   Yes Historical Provider, MD    Anti-infectives      Start     Dose/Rate Route Frequency Ordered Stop   04/27/16 1033  clindamycin (CLEOCIN) 900 MG/50ML IVPB    Comments:  Yetta Barre, Tomika   : cabinet override      04/27/16 1033 04/27/16 1410      Scheduled Meds: . insulin regular  0-10 Units Intravenous TID WC   Continuous Infusions: . sodium chloride    . insulin (NOVOLIN-R) infusion 6.3 Units/hr (04/27/16 1503)   PRN Meds:.dextrose, HYDROmorphone (DILAUDID) injection, meperidine (DEMEROL) injection, ondansetron (ZOFRAN) IV  Allergies  Allergen Reactions  . Cephalexin Swelling    Tongue and lip swelling  . Bactrim [Sulfamethoxazole-Trimethoprim] Nausea And Vomiting  . Penicillins Nausea And Vomiting and Swelling    Oral Cephalosporins cause Tongue and Lip Swelling  . Atenolol     Light headed/hair loss  . Chromic Sulfate Other (See Comments)    Chromic sutures--arm swells  . Codeine Nausea Only  . Contrast Media [Iodinated Diagnostic Agents] Other (See Comments)    Lips felt like on fire, rash, itching, lip peeling  . Desmopressin Other (See Comments)    Patient is unsure of reaction  . Dexilant [Dexlansoprazole] Diarrhea  . Doxycycline Nausea And Vomiting  . Entex     REACTION: unknown  . Marcelline Deist [Dapagliflozin] Other (See Comments)    Patient is unsure of reaction  . Gabapentin     REACTION: unknown  . Levaquin [Levofloxacin] Other (See Comments)    GI upset  . Lexapro [Escitalopram] Other (See Comments)    Weight gain  . Lisinopril Other (See Comments)    Unsure of reaction.  . Losartan Potassium  Other (See Comments)  . Metformin     REACTION: unknown  . Other     Bellaryl  . Pregabalin Other (See Comments)    REACTION: unknown  . Rosiglitazone Maleate Other (See Comments)    REACTION: unknown  . Simvastatin Other (See Comments)    Muscle aches.  . Sitagliptin Other (See Comments)    Muscle ache  . Tradjenta [Linagliptin] Other (See Comments)    Joint pain    Physical Exam  Vitals  Blood pressure  (!) 141/57, pulse 78, temperature 97 F (36.1 C), resp. rate 16, height 5\' 3"  (1.6 m), weight 69 kg (152 lb 1.6 oz), SpO2 95 %.   1. General  lying in bed in NAD,    2. Normal affect and insight, Not Suicidal or Homicidal, Awake Alert, Oriented to self, not oriented to time or place, but calm and cooperative  3. No F.N deficits, ALL C.Nerves Intact, Strength 5/5 all 4 extremities, Sensation intact all 4 extremities, Plantars down going.  4. Ears and Eyes appear Normal, Conjunctivae clear, PERRLA. Moist Oral Mucosa.  5. Neck post op changes, neck collar and drain in place  6. Symmetrical Chest wall movement, Good air movement bilaterally, CTAB.  7. RRR, No Gallops, Rubs or Murmurs, No Parasternal Heave.  8. Positive Bowel Sounds, Abdomen Soft, No tenderness, No organomegaly appriciated,No rebound -guarding or rigidity.  9.  No Cyanosis, Normal Skin Turgor, No Skin Rash or Bruise.  10. Good muscle tone,  joints appear normal , no effusions, Normal ROM.  11. No Palpable Lymph Nodes in Neck or Axillae    Data Review  CBC  Recent Labs Lab 04/21/16 1309 04/27/16 1502  WBC 9.9  --   HGB 14.3 12.6  HCT 41.3 37.0  PLT 247  --   MCV 84.3  --   MCH 29.2  --   MCHC 34.6  --   RDW 13.8  --    ------------------------------------------------------------------------------------------------------------------  Chemistries   Recent Labs Lab 04/21/16 1309 04/27/16 1502  NA 134* 137  K 3.7 3.8  CL 98*  --   CO2 26  --   GLUCOSE 334* 269*  BUN 14  --   CREATININE 0.99  --   CALCIUM 9.6  --   AST 24  --   ALT 20  --   ALKPHOS 89  --   BILITOT 0.9  --    ------------------------------------------------------------------------------------------------------------------ estimated creatinine clearance is 47.8 mL/min for female patients and 58.9 mL/min for female patients (by C-G formula based on SCr of 0.99  mg/dL). ------------------------------------------------------------------------------------------------------------------ No results for input(s): TSH, T4TOTAL, T3FREE, THYROIDAB in the last 72 hours.  Invalid input(s): FREET3   Coagulation profile No results for input(s): INR, PROTIME in the last 168 hours. ------------------------------------------------------------------------------------------------------------------- No results for input(s): DDIMER in the last 72 hours. -------------------------------------------------------------------------------------------------------------------  Cardiac Enzymes No results for input(s): CKMB, TROPONINI, MYOGLOBIN in the last 168 hours.  Invalid input(s): CK ------------------------------------------------------------------------------------------------------------------ Invalid input(s): POCBNP   ---------------------------------------------------------------------------------------------------------------  Urinalysis    Component Value Date/Time   COLORURINE YELLOW 04/21/2016 1310   APPEARANCEUR HAZY (A) 04/21/2016 1310   LABSPEC 1.025 04/21/2016 1310   PHURINE 5.0 04/21/2016 1310   GLUCOSEU >=500 (A) 04/21/2016 1310   HGBUR NEGATIVE 04/21/2016 1310   BILIRUBINUR NEGATIVE 04/21/2016 1310   KETONESUR 5 (A) 04/21/2016 1310   PROTEINUR NEGATIVE 04/21/2016 1310   UROBILINOGEN 0.2 10/28/2014 1321   NITRITE NEGATIVE 04/21/2016 1310   LEUKOCYTESUR NEGATIVE 04/21/2016 1310  Assessment & Plan  Active Problems:   Cervical spinal stenosis   Aftercare following surgery    1. Post op hyperglycemia, in a patient with insulin dependent dm2, recent a1c 7%  A week ago. she was started on insulin drip in the pacu , her blood sugar has improved on insulin drip, insulin drip was discontinued prior to coming to the floor, she is started on subq insulin, will close monitor blood sugar, adjust insulin accordingly  2. Patient is at  risk of post op delirium with h/o delirium in the past. Currently she is not oriented to time or place, but she is calm and cooperative, will close monitor  3 h/o CVA and PVD s/p left CEA 10/2014 , on statin, asa on hold for surgery, resume when ok with surgery   4. HTN: continue home meds bystolic/cardizem, hold hctz until adequate oral intake, prn hydralazine for spb >170  5.sinus node dysfunction w/ MDT PPM,remote history of PAFib, no recurrent afib since device placment , although high CHADS score, she is not on anticoagulation per recent cardiology note in 03/2016  6. DVT w/ PE remotely(no longer on warfarin), she is on SCd currently, pharmacological DVT prophylaxis to be determined by ortho   DVT Prophylaxis per ortho   AM Labs Ordered, also please review Full Orders  Family Communication: Plan discussed with patient and RN at bedside   Thank you for the consult, we will follow the patient with you in the Hospital.   Tezra Mahr M.D PhD on 04/27/2016 at 4:59 PM  Between 7am to 7pm - Pager - [phone]  After 7pm go to [email or web address] - password [facility]      [facility] Office  [phone]

## 2016-04-27 NOTE — Brief Op Note (Signed)
04/27/2016  4:02 PM  PATIENT:  [name] GlazeLinda J [name]  71 y.o. female  PRE-OPERATIVE DIAGNOSIS:  Cervical Spondylosis C5-6, C6-7  POST-OPERATIVE DIAGNOSIS:  Cervical Spondylosis C5-6, C6-7  PROCEDURE:  Procedure(s): C5-6, C6-7 Anterior Cervical Discectomy and Fusion, Allograft, Plate (N/A)  SURGEON:  Surgeon(s) and Role:    Eldred Manges* Mark C Yates, MD - Primary  PHYSICIAN ASSISTANT: Zonia Kiefjames Laurice Kimmons    ANESTHESIA:   general  EBL:  No intake/output data recorded.  BLOOD ADMINISTERED:none  DRAINS: hemovac   LOCAL MEDICATIONS USED:  MARCAINE     SPECIMEN:  No Specimen  DISPOSITION OF SPECIMEN:  N/A  COUNTS:  YES  TOURNIQUET:  * No tourniquets in log *  DICTATION: .Dragon Dictation  PLAN OF CARE: Admit for overnight observation  PATIENT DISPOSITION:  PACU - hemodynamically stable.

## 2016-04-27 NOTE — Interval H&P Note (Signed)
History and Physical Interval Note:  04/27/2016 12:49 PM  [name]  has presented today for surgery, with the diagnosis of Cervical Spondylosis C5-6, C6-7  The various methods of treatment have been discussed with the patient and family. After consideration of risks, benefits and other options for treatment, the patient has consented to  Procedure(s): C5-6, C6-7 Anterior Cervical Discectomy and Fusion, Allograft, Plate (N/A) as a surgical intervention .  The patient's history has been reviewed, patient examined, no change in status, stable for surgery.  I have reviewed the patient's chart and labs.  Questions were answered to the patient's satisfaction.     Eldred MangesMark C Cayli Escajeda

## 2016-04-27 NOTE — Progress Notes (Signed)
Patient did not follow PAT instructions to take morning medications. Patient blood glucose 384. Notified Dr. Maple HudsonMoser, new orders received. Will continue to monitor.

## 2016-04-28 DIAGNOSIS — R739 Hyperglycemia, unspecified: Secondary | ICD-10-CM

## 2016-04-28 DIAGNOSIS — M1990 Unspecified osteoarthritis, unspecified site: Secondary | ICD-10-CM | POA: Diagnosis not present

## 2016-04-28 DIAGNOSIS — I10 Essential (primary) hypertension: Secondary | ICD-10-CM

## 2016-04-28 DIAGNOSIS — F329 Major depressive disorder, single episode, unspecified: Secondary | ICD-10-CM | POA: Diagnosis not present

## 2016-04-28 DIAGNOSIS — M50122 Cervical disc disorder at C5-C6 level with radiculopathy: Secondary | ICD-10-CM | POA: Diagnosis not present

## 2016-04-28 DIAGNOSIS — I4891 Unspecified atrial fibrillation: Secondary | ICD-10-CM | POA: Diagnosis not present

## 2016-04-28 DIAGNOSIS — M4722 Other spondylosis with radiculopathy, cervical region: Secondary | ICD-10-CM | POA: Diagnosis not present

## 2016-04-28 DIAGNOSIS — M4802 Spinal stenosis, cervical region: Secondary | ICD-10-CM | POA: Diagnosis not present

## 2016-04-28 LAB — URINALYSIS, ROUTINE W REFLEX MICROSCOPIC
BILIRUBIN URINE: NEGATIVE
Glucose, UA: >=500 mg/dL — AB
HGB URINE DIPSTICK: NEGATIVE
Ketones, ur: NEGATIVE mg/dL
Leukocytes, UA: NEGATIVE
NITRITE: NEGATIVE
PH: 7 (ref 5.0–8.0)
Protein, ur: NEGATIVE mg/dL
SPECIFIC GRAVITY, URINE: 1.009 (ref 1.005–1.030)
WBC, UA: NONE SEEN WBC/hpf (ref 0–5)

## 2016-04-28 LAB — BASIC METABOLIC PANEL
ANION GAP: 10 (ref 5–15)
BUN: <5 mg/dL — ABNORMAL LOW (ref 6–20)
CHLORIDE: 98 mmol/L — AB (ref 101–111)
CO2: 26 mmol/L (ref 22–32)
Calcium: 8.8 mg/dL — ABNORMAL LOW (ref 8.9–10.3)
Creatinine, Ser: 0.79 mg/dL (ref 0.44–1.00)
GFR calc Af Amer: >60 mL/min (ref >60)
GFR calc non Af Amer: >60 mL/min (ref >60)
Glucose, Bld: 261 mg/dL — ABNORMAL HIGH (ref 65–99)
POTASSIUM: 3.5 mmol/L (ref 3.5–5.1)
SODIUM: 134 mmol/L — AB (ref 135–145)

## 2016-04-28 LAB — GLUCOSE, CAPILLARY
GLUCOSE-CAPILLARY: 247 mg/dL — AB (ref 65–99)
GLUCOSE-CAPILLARY: 266 mg/dL — AB (ref 65–99)

## 2016-04-28 MED ORDER — HYDROCODONE-ACETAMINOPHEN 5-325 MG PO TABS: 1.0000 | ORAL_TABLET | Freq: Four times a day (QID) | ORAL | 0 refills | Status: AC | PRN

## 2016-04-28 NOTE — Discharge Instructions (Signed)
Keep collar on. Remove it only when showering with other collar wrapped in saran wrap.

## 2016-04-28 NOTE — Progress Notes (Signed)
[facility] PROGRESS NOTE  [name] XLK:[id number] DOB: 17-Mar-1945 DOA: 04/27/2016  PCP: Georgianne Fick, MD  Brief History/Interval Summary: 71 y.o. female, with h/o insulin dependent dm2, sinus node dysfunction w/ MDT PPM, CAD, DVT w/ PE remotely(no longer on warfarin), remote history of PAFib, no recurrent afib since device placment , although high CHADS score, she is not on anticoagulation per recent cardiology note in 03/2016, she also has h/o CVA andPVD s/p left CEA 10/2014. She had elective surgery for cervical spondylosis, she had preop cardiac eval in 03/2016. Post operatively, she was found to have elevated blood sugar in the 300's, she is started on insulin drip in the PACU, hospitalist consulted for post op diabetes management.  Subjective/Interval History: Patient denies any pain in her neck. Overall feels well. Denies any nausea, vomiting. Does report some difficulty with getting out of bed due to fatigue and weakness.  ROS: Denies any chest pain or shortness of breath.  Objective:  Vital Signs  Vitals:   04/28/16 0016 04/28/16 0020 04/28/16 0630 04/28/16 0820  BP: (!) 182/68 (!) 196/60 (!) 147/100 (!) 169/60  Pulse: 71  67 82  Resp:      Temp: 97.8 F (36.6 C)  98.6 F (37 C)   TempSrc: Oral  Oral   SpO2: 100%  94% 95%  Weight:      Height:        Intake/Output Summary (Last 24 hours) at 04/28/16 1311 Last data filed at 04/28/16 3664  Gross per 24 hour  Intake             1520 ml  Output               75 ml  Net             1445 ml   Filed Weights   04/27/16 1047  Weight: 69 kg (152 lb 1.6 oz)    General appearance: alert, cooperative, appears stated age and no distress Resp: clear to auscultation bilaterally Cardio: regular rate and rhythm, S1, S2 normal, no murmur, click, rub or gallop GI: soft, non-tender; bowel sounds normal; no masses,  no organomegaly Extremities: extremities normal, atraumatic, no cyanosis or edema Neurologic: Alert  and oriented X 3, normal strength and tone.   Lab Results:  Data Reviewed: I have personally reviewed following labs and imaging studies  CBC:  Recent Labs Lab 04/27/16 1502  HGB 12.6  HCT 37.0    Basic Metabolic Panel:  Recent Labs Lab 04/27/16 1502 04/28/16 0647  NA 137 134*  K 3.8 3.5  CL  --  98*  CO2  --  26  GLUCOSE 269* 261*  BUN  --  <5*  CREATININE  --  0.79  CALCIUM  --  8.8*    GFR: Estimated Creatinine Clearance (by C-G formula based on SCr of 0.79 mg/dL) Female: 40.3 mL/min Female: 72.8 mL/min  CBG:  Recent Labs Lab 04/27/16 1714 04/27/16 1814 04/27/16 2119 04/28/16 0603 04/28/16 1222  GLUCAP 189* 143* 197* 247* 266*     Recent Results (from the past 240 hour(s))  Surgical pcr screen     Status: None   Collection Time: 04/21/16  1:20 PM  Result Value Ref Range Status   MRSA, PCR NEGATIVE NEGATIVE Final   Staphylococcus aureus NEGATIVE NEGATIVE Final    Comment:        The Xpert SA Assay (FDA approved for NASAL specimens in patients over 65 years of age), is one component of  a comprehensive surveillance program.  Test performance has been validated by [facility] for patients greater than or equal to 73 year old. It is not intended to diagnose infection nor to guide or monitor treatment.       Radiology Studies: Dg Cervical Spine 2-3 Views  Result Date: 04/27/2016 CLINICAL DATA:  Anterior cervical spinal fusion at C5-C7. EXAM: CERVICAL SPINE - 2-3 VIEW COMPARISON:  CTA of the neck performed 02/03/2016 FINDINGS: Anterior cervical spinal fusion hardware is noted at C5-C7, on two provided C-arm images from the OR. Mild underlying degenerative change is noted along the cervical spine. IMPRESSION: Status post anterior cervical spinal fusion at C5-C7. Electronically Signed   By: Roanna RaiderJeffery  Chang M.D.   On: 04/27/2016 19:20   Dg C-arm 1-60 Min  Result Date: 04/27/2016 CLINICAL DATA:  Anterior cervical spinal fusion at C5-C7. EXAM:  CERVICAL SPINE - 2-3 VIEW COMPARISON:  CTA of the neck performed 02/03/2016 FINDINGS: Anterior cervical spinal fusion hardware is noted at C5-C7, on two provided C-arm images from the OR. Mild underlying degenerative change is noted along the cervical spine. IMPRESSION: Status post anterior cervical spinal fusion at C5-C7. Electronically Signed   By: Roanna RaiderJeffery  Chang M.D.   On: 04/27/2016 19:20     Medications:  Scheduled: . atorvastatin  20 mg Oral q1800  . cycloSPORINE  1 drop Both Eyes BID  . diltiazem  120 mg Oral Daily  . docusate sodium  100 mg Oral BID  . DULoxetine  90 mg Oral Daily  . insulin aspart  0-20 Units Subcutaneous TID WC  . insulin aspart  0-5 Units Subcutaneous QHS  . insulin glargine  30 Units Subcutaneous QHS  . nebivolol  10 mg Oral BID  . pantoprazole  40 mg Oral Daily  . polyethylene glycol  17 g Oral Daily  . sodium chloride flush  3 mL Intravenous Q12H  . traZODone  25 mg Oral QHS   Continuous: . sodium chloride    . sodium chloride 75 mL/hr at 04/27/16 1834  . sodium chloride     ZOX:[id number]:acetaminophen **OR** acetaminophen, EPINEPHrine, hydrALAZINE, HYDROcodone-acetaminophen, HYDROmorphone (DILAUDID) injection, menthol-cetylpyridinium **OR** phenol, methocarbamol **OR** methocarbamol (ROBAXIN)  IV, ondansetron (ZOFRAN) IV, sodium chloride flush  Assessment/Plan:  Active Problems:   Cervical spinal stenosis   Aftercare following surgery    Post op hyperglycemia, in a patient with insulin dependent dm2, recent a1c 7% Patient had to initially started on an insulin infusion. Her glucose levels have improved. She was transitioned to Lantus. Patient takes Humulin N at home. She may resume her home medication. She should follow-up with her primary care provider. She has been asked to check her blood glucose levels at least 3-4 times a day.   H/o CVA andPVD s/p left CEA 10/2014 Stable. She may continue her home medication. Mental status is at baseline.    Essential HTN Blood pressure noted to be elevated. Resume all of her home medications.  Sinus node dysfunction w/ MDT PPM, remote history of PAFib She is followed by cardiology. Not noted to be on anticoagulation. Stable.  Since patient did report some fatigue and generalized weakness, physical therapy evaluation was ordered. Home health will be ordered as per their recommendations. She'll also mentioned some burning sensation with urination. UA does not show any infection.  Further management per orthopedics.  We will continue to follow on a daily basis.    LOS: 1 day   [facility] HospitalKRISHNAN,Yaeko Fazekas  [facility] Pager [phone] 04/28/2016, 1:11 PM  If 7PM-7AM,  please contact night-coverage at [email or web address], password [name]

## 2016-04-28 NOTE — Progress Notes (Signed)
Orthopedic Tech Progress Note Patient Details:  [name] GlazeLinda J [name] 06-30-1945 [id number]  Ortho Devices Type of Ortho Device: Soft collar Ortho Device/Splint Interventions: Application   Saul FordyceJennifer C Gerold Sar 04/28/2016, 8:36 AM

## 2016-04-28 NOTE — Evaluation (Signed)
Physical Therapy Evaluation Patient Details Name: [name] [name] MRN: [id number] DOB: 22-Sep-1945 Today's Date: 04/28/2016   History of Present Illness  Pt is a 71 yo female admitted on 04/27/16 for C5-C6, C6-C7 anterior discectomy and fusion with allograft and place. PMH significant for Anxiety, OA, A-fib, Cardiac pacemaker, depression, GERD, HTN, LBBB, myalgia.   Clinical Impression  Pt is POD 1 following the above procedure. Prior to admission, pt lived with her husband in a single level home. Pt was independent with a Idanha for mobility, but has had multiple falls over the past 6 months with the most recent fall leading to the need for the current surgery. Pt will benefit from continued skilled PT acutely in order to maximize her outcomes. Pt was, however, discharged after morning evaluation and no goals were updated.     Follow Up Recommendations Home health PT    Equipment Recommendations  3in1 (PT)    Recommendations for Other Services       Precautions / Restrictions Precautions Precautions: Cervical Precaution Comments: handout given and reviewed Required Braces or Orthoses: Cervical Brace Cervical Brace: Soft collar Restrictions Weight Bearing Restrictions: No      Mobility  Bed Mobility Overal bed mobility: Needs Assistance Bed Mobility: Supine to Sit     Supine to sit: Min assist     General bed mobility comments: Min A and cues for proper mechanics to sit EOB  Transfers Overall transfer level: Needs assistance Equipment used: 1 person hand held assist;Rolling walker (2 wheeled) Transfers: Sit to/from Stand Sit to Stand: Min guard;Min assist         General transfer comment: Min A from EOB for safety with 1 hand held assist, Min guard form commode with RW   Ambulation/Gait Ambulation/Gait assistance: Min guard Ambulation Distance (Feet): 150 Feet Assistive device: Rolling walker (2 wheeled) Gait Pattern/deviations: Step-through pattern;Decreased step  length - right;Decreased step length - left;Narrow base of support Gait velocity: decreased Gait velocity interpretation: Below normal speed for age/gender General Gait Details: Cues for increased step length given, pt has increased c/o low back pain with any mobility  Stairs            Wheelchair Mobility    Modified Rankin (Stroke Patients Only)       Balance Overall balance assessment: History of Falls                                           Pertinent Vitals/Pain Pain Assessment: 0-10 Pain Score: 10-Worst pain ever Pain Location: cervical region Pain Descriptors / Indicators: Aching;Grimacing;Guarding Pain Intervention(s): Monitored during session;Patient requesting pain meds-RN notified;RN gave pain meds during session;Repositioned    Home Living Family/patient expects to be discharged to:: Private residence Living Arrangements: Spouse/significant other Available Help at Discharge: Family;Available 24 hours/day Type of Home: House Home Access: Stairs to enter Entrance Stairs-Rails: None Entrance Stairs-Number of Steps: 1 Home Layout: One level Home Equipment: Cane - single point      Prior Function Level of Independence: Independent with assistive device(s)         Comments: was unable to do much due to pain.      Hand Dominance   Dominant Hand: Right    Extremity/Trunk Assessment   Upper Extremity Assessment Upper Extremity Assessment: Generalized weakness    Lower Extremity Assessment Lower Extremity Assessment: Generalized weakness    Cervical / Trunk Assessment  Cervical / Trunk Assessment: Other exceptions Cervical / Trunk Exceptions: cervical surgery leading to decreased ROM  Communication   Communication: No difficulties  Cognition Arousal/Alertness: Awake/alert Behavior During Therapy: WFL for tasks assessed/performed Overall Cognitive Status: Within Functional Limits for tasks assessed                       General Comments      Exercises     Assessment/Plan    PT Assessment Patient needs continued PT services  PT Problem List Decreased strength;Decreased activity tolerance;Decreased balance;Decreased mobility;Decreased knowledge of use of DME;Pain       PT Treatment Interventions DME instruction;Gait training;Stair training;Functional mobility training;Therapeutic activities;Therapeutic exercise;Balance training;Patient/family education    PT Goals (Current goals can be found in the Care Plan section)  Acute Rehab PT Goals Patient Stated Goal: to feel better and have less pain PT Goal Formulation: With patient Time For Goal Achievement: 05/05/16 Potential to Achieve Goals: Good    Frequency Min 5X/week   Barriers to discharge        Co-evaluation               End of Session Equipment Utilized During Treatment: Gait belt Activity Tolerance: Patient tolerated treatment well Patient left: in chair;with call bell/phone within reach Nurse Communication: Mobility status;Patient requests pain meds PT Visit Diagnosis: Unsteadiness on feet (R26.81);History of falling (Z91.81)    Functional Assessment Tool Used: AM-PAC 6 Clicks Basic Mobility;Clinical judgement Functional Limitation: Mobility: Walking and moving around Mobility: Walking and Moving Around Current Status (Z6109): At least 40 percent but less than 60 percent impaired, limited or restricted Mobility: Walking and Moving Around Goal Status [phone]): At least 1 percent but less than 20 percent impaired, limited or restricted    Time: 0940-1022 PT Time Calculation (min) (ACUTE ONLY): 42 min   Charges:   PT Evaluation $PT Eval Moderate Complexity: 1 Procedure PT Treatments $Gait Training: 8-22 mins $Therapeutic Activity: 8-22 mins   PT G Codes:   PT G-Codes **NOT FOR INPATIENT CLASS** Functional Assessment Tool Used: AM-PAC 6 Clicks Basic Mobility;Clinical judgement Functional Limitation: Mobility:  Walking and moving around Mobility: Walking and Moving Around Current Status (U9811): At least 40 percent but less than 60 percent impaired, limited or restricted Mobility: Walking and Moving Around Goal Status [phone]): At least 1 percent but less than 20 percent impaired, limited or restricted     Colin Broach PT, DPT  [phone]  04/28/2016, 1:47 PM

## 2016-04-28 NOTE — Care Management Note (Signed)
Case Management Note  Patient Details  Name: [name] GlazeLinda J [name] MRN: [id number] Date of Birth: [birth year]  Subjective/Objective: 71 y.o. F s/p/ C5-C6; C6-C7 Fusion. [facility] Jermaine, REp has been notified. [facility]                    Action/Plan: Anticipate discharge home today. No further CM needs but will be available should additional discharge needs arise.   Expected Discharge Date:  04/28/16               Expected Discharge Plan:  Home w Home Health Services  In-House Referral:  NA  Discharge planning Services  CM Consult  Post Acute Care Choice:  Durable Medical Equipment, Home Health Choice offered to:  Patient, Spouse  DME Arranged:  3-N-1 DME Agency:  [facility]  HH Arranged:  PT HH Agency:  [facility]  Status of Service:  Completed, signed off  If discussed at Long Length of Stay Meetings, dates discussed:    Additional Comments:  Yvone NeuCrutchfield, Ike Maragh M, RN 04/28/2016, 11:36 AM

## 2016-04-28 NOTE — Progress Notes (Signed)
Reviewed discharge instructions/medication with patient. Answered her questions. Waiting on volunteer to take her out.

## 2016-04-29 NOTE — Discharge Summary (Signed)
Patient ID: [name] MYRICKS MRN: [id number] DOB/AGE: 06/18/1945 71 y.o.  Admit date: 04/27/2016 Discharge date: 04/29/2016  Admission Diagnoses:  Active Problems:   Cervical spinal stenosis   Aftercare following surgery   Discharge Diagnoses:  Active Problems:   Cervical spinal stenosis   Aftercare following surgery  status post Procedure(s): C5-6, C6-7 Anterior Cervical Discectomy and Fusion, Allograft, Plate  Past Medical History:  Diagnosis Date  . Allergic rhinitis, cause unspecified   . Anxiety state, unspecified   . Arthritis   . Atrial fibrillation (HCC)   . Bronchitis, not specified as acute or chronic   . Cardiac pacemaker medtronic   . 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 Depressive disorder, not elsewhere classified   . Disorders of sacrum   . GERD (gastroesophageal reflux disease)   . Headache(784.0)    history of  . Heart murmur   . History of hiatal hernia   . Hypertension   . LBBB (left bundle branch block)   . Myalgia and myositis, unspecified   . Obstructive sleep apnea (adult) (pediatric)   . Other voice and resonance disorders   . Pain in joint, lower leg    bilateral  . Presence of permanent cardiac pacemaker   . Pulmonary embolism (HCC)   . Rash, skin 2 weeks per patient   right arm, inner aspect    . Rheumatic fever in pediatric patient   . Total knee replacement status   . Type II or unspecified type diabetes mellitus without mention of complication, not stated as uncontrolled    Type II  . Unspecified asthma(493.90)   . 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 Vertigo    Meclizine (Antivert)    Surgeries: Procedure(s): C5-6, C6-7 Anterior Cervical Discectomy and Fusion, Allograft, Plate on 06/29/9561   Consultants:   Discharged Condition: Improved  Hospital Course: [name] is an 71 y.o. female who was admitted 04/27/2016 for operative  treatment of cervical stenosis. Patient failed conservative treatments (please see the history and physical for the specifics) and had severe unremitting pain that affects sleep, daily activities and work/hobbies. After pre-op clearance, the patient was taken to the operating room on 04/27/2016 and underwent  Procedure(s): C5-6, C6-7 Anterior Cervical Discectomy and Fusion, Allograft, Plate.    Patient was given perioperative antibiotics: Anti-infectives    Start     Dose/Rate Route Frequency Ordered Stop   04/27/16 1033  clindamycin (CLEOCIN) 900 MG/50ML IVPB    Comments:  Domenica Fail   : cabinet override      04/27/16 1033 04/27/16 1410       Patient was given sequential compression devices and early ambulation to prevent DVT.   Patient benefited maximally from hospital stay and there were no complications. At the time of discharge, the patient was urinating/moving their bowels without difficulty, tolerating a regular diet, pain is controlled with oral pain medications and they have been cleared by PT/OT.   Recent vital signs: No data found.    Recent laboratory studies:  Recent Labs  04/27/16 1502 04/28/16 0647  HGB 12.6  --   HCT 37.0  --   NA 137 134*  K 3.8 3.5  CL  --  98*  CO2  --  26  BUN  --  <5*  CREATININE  --  0.79  GLUCOSE 269* 261*  CALCIUM  --  8.8*     Discharge Medications:  Allergies as of 04/28/2016      Reactions   Cephalexin Swelling   Tongue and lip swelling   Bactrim [sulfamethoxazole-trimethoprim] Nausea And Vomiting   Penicillins Nausea And Vomiting, Swelling   Oral Cephalosporins cause Tongue and Lip Swelling   Atenolol    Light headed/hair loss   Chromic Sulfate Other (See Comments)   Chromic sutures--arm swells   Codeine Nausea Only   Contrast Media [iodinated Diagnostic Agents] Other (See Comments)   Lips felt like on fire, rash, itching, lip peeling   Desmopressin Other (See Comments)   Patient is unsure of reaction   Dexilant  [dexlansoprazole] Diarrhea   Doxycycline Nausea And Vomiting   Entex    REACTION: unknown   Farxiga [dapagliflozin] Other (See Comments)   Patient is unsure of reaction   Gabapentin    REACTION: unknown   Levaquin [levofloxacin] Other (See Comments)   GI upset   Lexapro [escitalopram] Other (See Comments)   Weight gain   Lisinopril Other (See Comments)   Unsure of reaction.   Losartan Potassium Other (See Comments)   Metformin    REACTION: unknown   Other    Bellaryl   Pregabalin Other (See Comments)   REACTION: unknown   Rosiglitazone Maleate Other (See Comments)   REACTION: unknown   Simvastatin Other (See Comments)   Muscle aches.   Sitagliptin Other (See Comments)   Muscle ache   Tradjenta [linagliptin] Other (See Comments)   Joint pain      Medication List    STOP taking these medications   nitrofurantoin (macrocrystal-monohydrate) 100 MG capsule Commonly known as:  MACROBID     TAKE these medications   aspirin EC 81 MG tablet Take 81 mg by mouth daily.   atorvastatin 20 MG tablet Commonly known as:  LIPITOR Take 20 mg by mouth daily.   BD PEN NEEDLE NANO U/F 32G X 4 MM Misc Generic drug:  Insulin Pen Needle Use as directed for Insulin Injections   cycloSPORINE 0.05 % ophthalmic emulsion Commonly known as:  RESTASIS Place 1 drop into both eyes 2 (two) times daily.   diltiazem 120 MG 24 hr capsule Commonly known as:  CARDIZEM CD Take 1 capsule (120 mg total) by mouth daily.   DULoxetine 30 MG capsule Commonly known as:  CYMBALTA Take 90 mg by mouth daily.   EPINEPHrine 0.3 mg/0.3 mL Soaj injection Commonly known as:  EPI-PEN Inject 0.3 mg into the muscle as needed (FOR ALLERGIES).   Fish Oil 1200 MG Caps Take 2,400 mg by mouth daily.   HUMULIN N KWIKPEN 100 UNIT/ML Kiwkpen Generic drug:  Insulin NPH (Human) (Isophane) Inject 60 Units into the skin 2 (two) times daily.   hydrochlorothiazide 25 MG tablet Commonly known as:  HYDRODIURIL Take  0.5 tablets (12.5 mg total) by mouth daily.   HYDROcodone-acetaminophen 5-325 MG tablet Commonly known as:  NORCO Take 1-2 tablets by mouth every 6 (six) hours as needed for moderate pain.   nebivolol 10 MG tablet Commonly known as:  BYSTOLIC TAKE 1 TABLET BY MOUTH TWICE A DAY. What changed:  how much to take  how to take this  when to take this  additional instructions   NOVOLOG FLEXPEN 100 UNIT/ML FlexPen Generic drug:  insulin aspart Inject into the skin 3 (three) times daily with meals. Sliding scale   ONETOUCH VERIO test strip Generic drug:  glucose blood 1 each by Other route 2 (two) times daily.   polyethylene glycol powder powder Commonly known as:  GLYCOLAX/MIRALAX Take 17 g by mouth daily as needed. For constipation.   traZODone 50 MG tablet Commonly known as:  DESYREL Take 25 mg by mouth at bedtime.   ZEGERID 40-1100 MG capsule Generic drug:  omeprazole-sodium bicarbonate Take 1 capsule by mouth at bedtime.       Diagnostic Studies: Dg Cervical Spine 2-3 Views  Result Date: 04/27/2016 CLINICAL DATA:  Anterior cervical spinal fusion at C5-C7. EXAM: CERVICAL SPINE - 2-3 VIEW COMPARISON:  CTA of the neck performed 02/03/2016 FINDINGS: Anterior cervical spinal fusion hardware is noted at C5-C7, on two provided C-arm images from the OR. Mild underlying degenerative change is noted along the cervical spine. IMPRESSION: Status post anterior cervical spinal fusion at C5-C7. Electronically Signed   By: Roanna RaiderJeffery  Chang M.D.   On: 04/27/2016 19:20   Dg C-arm 1-60 Min  Result Date: 04/27/2016 CLINICAL DATA:  Anterior cervical spinal fusion at C5-C7. EXAM: CERVICAL SPINE - 2-3 VIEW COMPARISON:  CTA of the neck performed 02/03/2016 FINDINGS: Anterior cervical spinal fusion hardware is noted at C5-C7, on two provided C-arm images from the OR. Mild underlying degenerative change is noted along the cervical spine. IMPRESSION: Status post anterior cervical spinal fusion at  C5-C7. Electronically Signed   By: Roanna RaiderJeffery  Chang M.D.   On: 04/27/2016 19:20    Discharge Instructions    Discharge instructions    Complete by:  As directed    Please check your blood sugar levels regularly at home. Please make sure you eat your meals to avoid a drop in blood sugar. Please follow up with your PCP for further management of diabetes.      Follow-up Information    Eldred MangesMark C Yates, MD Follow up in 1 week(s).   Specialty:  Orthopedic Surgery Contact information: [address] [place] [address] [phone]        [facility] - [facility] Follow up.   Why:  BSC will be delivered to your room prior to discharge Contact information: [address] [place] [address] [phone]        [facility] Follow up.   Why:  HHPT will be provided by the  agency you have chosen. A representative should call within 24-48 hours to arrange initial visit.  Contact information: [address] [place] [address] [phone]           Discharge Plan:  discharge to home  Disposition:     Signed: Zonia KiefJames Demarqus Jocson for Annell GreeningMark Yates MD 04/29/2016, 12:48 PM

## 2016-04-30 NOTE — Anesthesia Postprocedure Evaluation (Signed)
Anesthesia Post Note  Patient: [name] GlazeLinda J [name]  Procedure(s) Performed: Procedure(s) (LRB): C5-6, C6-7 Anterior Cervical Discectomy and Fusion, Allograft, Plate (N/A)  Patient location during evaluation: PACU Anesthesia Type: General Level of consciousness: awake and alert Pain management: pain level controlled Vital Signs Assessment: post-procedure vital signs reviewed and stable Respiratory status: spontaneous breathing, nonlabored ventilation, respiratory function stable and patient connected to nasal cannula oxygen Cardiovascular status: blood pressure returned to baseline and stable Postop Assessment: no signs of nausea or vomiting Anesthetic complications: no       Last Vitals:  Vitals:   04/28/16 0630 04/28/16 0820  BP: (!) 147/100 (!) 169/60  Pulse: 67 82  Resp:    Temp: 37 C     Last Pain:  Vitals:   04/28/16 1055  TempSrc:   PainSc: 3                  Damel Querry

## 2016-04-30 NOTE — Anesthesia Preprocedure Evaluation (Signed)
Anesthesia Evaluation  Patient identified by MRN, date of birth, ID band Patient awake    Reviewed: Allergy & Precautions, NPO status , Patient's Chart, lab work & pertinent test results  History of Anesthesia Complications Negative for: history of anesthetic complications  Airway Mallampati: II  TM Distance: >3 FB Neck ROM: Full    Dental  (+) Dental Advisory Given   Pulmonary asthma , sleep apnea , former smoker,    breath sounds clear to auscultation       Cardiovascular hypertension, + Peripheral Vascular Disease  + dysrhythmias + pacemaker + Valvular Problems/Murmurs  Rhythm:Regular     Neuro/Psych  Headaches, PSYCHIATRIC DISORDERS Anxiety Depression  Neuromuscular disease CVA    GI/Hepatic hiatal hernia, GERD  ,  Endo/Other  diabetes  Renal/GU Renal disease     Musculoskeletal   Abdominal   Peds  Hematology   Anesthesia Other Findings   Reproductive/Obstetrics                             Anesthesia Physical Anesthesia Plan  ASA: III  Anesthesia Plan: General   Post-op Pain Management:    Induction: Intravenous  Airway Management Planned: Oral ETT  Additional Equipment: None  Intra-op Plan:   Post-operative Plan: Extubation in OR  Informed Consent: I have reviewed the patients History and Physical, chart, labs and discussed the procedure including the risks, benefits and alternatives for the proposed anesthesia with the patient or authorized representative who has indicated his/her understanding and acceptance.   Dental advisory given  Plan Discussed with: CRNA and Surgeon  Anesthesia Plan Comments:         Anesthesia Quick Evaluation

## 2016-05-03 ENCOUNTER — Encounter: Payer: Medicare Other | Admitting: *Deleted

## 2016-05-03 ENCOUNTER — Telehealth (INDEPENDENT_AMBULATORY_CARE_PROVIDER_SITE_OTHER): Payer: Self-pay | Admitting: Orthopaedic Surgery

## 2016-05-03 ENCOUNTER — Telehealth: Payer: Self-pay | Admitting: Cardiology

## 2016-05-03 NOTE — Telephone Encounter (Signed)
Spoke with pt and reminded pt of remote transmission that is due today. Pt verbalized understanding.   

## 2016-05-03 NOTE — Telephone Encounter (Signed)
Darnelle from [facility] called wanting to let Dr. Ophelia CharterYates know that she has not had any physical therapy. She wants to talk with him first because she doesn't think its necessary. Cb # [phone]

## 2016-05-04 NOTE — Telephone Encounter (Signed)
Does not need PT

## 2016-05-04 NOTE — Telephone Encounter (Signed)
I attempted to call [name] and advise, but phone is disconnected. I did call patient and advise that she does not need it.

## 2016-05-04 NOTE — Telephone Encounter (Signed)
Please advise 

## 2016-05-06 ENCOUNTER — Ambulatory Visit (INDEPENDENT_AMBULATORY_CARE_PROVIDER_SITE_OTHER): Payer: Medicare Other

## 2016-05-06 ENCOUNTER — Ambulatory Visit (INDEPENDENT_AMBULATORY_CARE_PROVIDER_SITE_OTHER): Payer: Medicare Other | Admitting: Orthopaedic Surgery

## 2016-05-06 ENCOUNTER — Encounter (INDEPENDENT_AMBULATORY_CARE_PROVIDER_SITE_OTHER): Payer: Self-pay | Admitting: Orthopaedic Surgery

## 2016-05-06 VITALS — BP 154/73 | HR 89 | Ht 63.0 in | Wt 157.0 lb

## 2016-05-06 DIAGNOSIS — M4802 Spinal stenosis, cervical region: Secondary | ICD-10-CM

## 2016-05-06 NOTE — Progress Notes (Signed)
Post-Op Visit Note   Patient: [name]           Date of Birth: 03-02-1946           MRN: [id number] Visit Date: 05/06/2016 PCP: Georgianne Fick, MD   Assessment & Plan:  Chief Complaint:  Chief Complaint  Patient presents with  . Neck - Routine Post Op   Visit Diagnoses:  1. Cervical spinal stenosis   Posttest C5-6 C6-7 cervical fusion. Patient could not remember instructions did not repeat her postop instructions which I went over with her before discharge and has been out of her collar. X-rays look good she states her arm shoulder and neck pain is better but she still having pain on the right anterior thigh region associated with the back pain that she has and right thigh pain. Packs x-rays reviewed which shows a L3 20-30% compression fracture anteriorly at several years old. Return 5 weeks for x-rays as above. She's gotten improvement in her preoperative neck shoulder arm and hand problems.  Plan: Recheck 5 weeks. Flexion extension lateral C-spine x-ray as well as AP and lateral lumbar spine x-ray on return  Follow-Up Instructions: No Follow-up on file.   Orders:  Orders Placed This Encounter  Procedures  . XR Cervical Spine 2 or 3 views   No orders of the defined types were placed in this encounter.  HPI Patient presents for first post op visit. She is status post C5-6, C6-7 ACD&F, Allograft, and Plate on 06/13/8117.  She is 9 days post op. She comes in today not wearing her collar. She states that she must have misunderstood her discharge instructions and took it off on day 3.  I put a collar on her in clinic.  She states she is doing well. She denies any pain. She is taking Hydrocodone as needed. Her steri strips have been removed as well. Incision looks good.   Imaging: No results found.  PMFS History: Patient Active Problem List   Diagnosis Date Noted  . Cervical spinal stenosis 04/27/2016  . Aftercare following surgery 04/27/2016  . UTI (urinary tract  infection) 10/22/2015  . Hyponatremia 10/22/2015  . Hypokalemia 10/22/2015  . AKI (acute kidney injury) (HCC) 10/22/2015  . Delirium 10/21/2015  . Asymptomatic carotid artery stenosis 10/31/2014  . Left carotid bruit 08/26/2013  . Palpitations 06/24/2013  . 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 Hypertension   . Itching 01/03/2013  . GERD (gastroesophageal reflux disease) 11/23/2011  . PULMONARY EMBOLISM, hx of 12/22/2008  . RHINOCONJUNCTIVITIS, ALLERGIC 12/20/2007  . SACROILIAC JOINT DYSFUNCTION 09/28/2007  . Depressive disorder, not elsewhere classified 08/28/2007  . KNEE PAIN, RIGHT 08/28/2007  . PAIN IN JOINT OTHER SPECIFIED SITES 08/28/2007  . 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 CARDIAC PACEMAKER IN SITU 06/06/2007  . Diabetes mellitus, type 2 (HCC) 05/16/2007  . ANXIETY 05/16/2007  . Obstructive sleep apnea 05/16/2007  . H/O: CVA (cerebrovascular accident) 05/16/2007  . BRONCHITIS 05/16/2007  . FIBROMYALGIA 05/16/2007  . FACIAL PAIN 05/16/2007  . DYSPHONIA 05/16/2007  . OTHER DYSPHAGIA 05/16/2007   Past Medical History:  Diagnosis Date  . Allergic rhinitis, cause unspecified   . Anxiety state, unspecified   . Arthritis   . Atrial fibrillation (HCC)   . Bronchitis, not specified as acute or chronic   . Cardiac pacemaker medtronic   . 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 Depressive disorder, not elsewhere classified   . Disorders of sacrum   . GERD (  gastroesophageal reflux disease)   . Headache(784.0)    history of  . Heart murmur   . History of hiatal hernia   . Hypertension   . LBBB (left bundle branch block)   . Myalgia and myositis, unspecified   . Obstructive sleep apnea (adult) (pediatric)   . Other voice and resonance disorders   . Pain in joint, lower leg    bilateral  . Presence of permanent cardiac pacemaker   . Pulmonary embolism (HCC)   . Rash, skin 2 weeks per patient   right arm, inner  aspect    . Rheumatic fever in pediatric patient   . Total knee replacement status   . Type II or unspecified type diabetes mellitus without mention of complication, not stated as uncontrolled    Type II  . Unspecified asthma(493.90)   . 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 Vertigo    Meclizine (Antivert)    Family History  Problem Relation Age of Onset  . Cancer Father     LUNG  . Alcohol abuse Father   . Diabetes Mother   . Heart disease Mother   . Stroke Mother   . Heart attack Mother   . Hypertension Mother   . Deep vein thrombosis Mother   . Hyperlipidemia Mother   . Varicose Veins Mother   . Peripheral vascular disease Mother     amputation  . Lupus Sister   . Diabetes Sister   . Aneurysm Sister   . Deep vein thrombosis Sister   . Hyperlipidemia Sister   . Hypertension Sister   . Varicose Veins Sister   . Heart disease Sister   . Deep vein thrombosis Sister   . Diabetes Sister   . Hyperlipidemia Sister   . Hypertension Sister   . Varicose Veins Sister     Past Surgical History:  Procedure Laterality Date  . ABDOMINAL HYSTERECTOMY    . BALLOON DILATION N/A 06/26/2013   Procedure: BALLOON DILATION;  Surgeon: Vertell Novak., MD;  Location: [facility];  Service: Endoscopy;  Laterality: N/A;  . BILATERAL SALPINGOOPHORECTOMY  1991  . CHOLECYSTECTOMY    . COLONOSCOPY N/A 06/26/2013   Procedure: COLONOSCOPY;  Surgeon: Vertell Novak., MD;  Location: [facility];  Service: Endoscopy;  Laterality: N/A;  . ELBOW SURGERY Bilateral   . ENDARTERECTOMY Left 10/31/2014   Procedure: LEFT CAROTID ENDARTERECTOMY WITH BOVINE PERICARDIAL PATCH ANGIOPLASTY;  Surgeon: Nada Libman, MD;  Location: [facility];  Service: Vascular;  Laterality: Left;  . ESOPHAGOGASTRODUODENOSCOPY  11/02/2011   Procedure: ESOPHAGOGASTRODUODENOSCOPY (EGD);  Surgeon: Vertell Novak., MD;  Location: [facility];  Service: Endoscopy;   Laterality: N/A;  with xray  . ESOPHAGOGASTRODUODENOSCOPY N/A 06/26/2013   Procedure: ESOPHAGOGASTRODUODENOSCOPY (EGD);  Surgeon: Vertell Novak., MD;  Location: [facility];  Service: Endoscopy;  Laterality: N/A;  . INSERT / REPLACE / REMOVE PACEMAKER     insertion 2007  . NASAL FRACTURE SURGERY    . SAVORY DILATION  11/02/2011   Procedure: SAVORY DILATION;  Surgeon: Vertell Novak., MD;  Location: [facility];  Service: Endoscopy;  Laterality: N/A;  . TEMPOROMANDIBULAR JOINT ARTHROPLASTY  1982  . TOTAL KNEE ARTHROPLASTY  2001, 2010, 2012   right  . total knee repair  2010   right  . VESICOVAGINAL FISTULA CLOSURE W/ TAH  1974   Social History   Occupational History  . Not on file.  Social History Main Topics  . Smoking status: Former Smoker    Packs/day: 0.30    Years: 20.00    Types: Cigarettes    Quit date: 2007  . Smokeless tobacco: Never Used     Comment: pt smoked less than 1/4 ppd  . Alcohol use No  . Drug use: No     Comment: "now, marijuana I would do..." - but doesn't by report  . Sexual activity: Not on file

## 2016-05-11 ENCOUNTER — Encounter: Payer: Self-pay | Admitting: Cardiology

## 2016-05-18 ENCOUNTER — Telehealth (INDEPENDENT_AMBULATORY_CARE_PROVIDER_SITE_OTHER): Payer: Self-pay | Admitting: *Deleted

## 2016-05-18 NOTE — Telephone Encounter (Signed)
ERROR

## 2016-05-20 ENCOUNTER — Encounter (HOSPITAL_COMMUNITY): Payer: Self-pay | Admitting: Emergency Medicine

## 2016-05-20 ENCOUNTER — Emergency Department (HOSPITAL_COMMUNITY): Payer: Medicare Other

## 2016-05-20 ENCOUNTER — Inpatient Hospital Stay (HOSPITAL_COMMUNITY)
Admission: EM | Admit: 2016-05-20 | Discharge: 2016-05-22 | DRG: 092 | Disposition: A | Discharge status: Home / Self Care | Payer: Medicare Other | Attending: Family Medicine | Admitting: Family Medicine

## 2016-05-20 DIAGNOSIS — F0390 Unspecified dementia without behavioral disturbance: Secondary | ICD-10-CM | POA: Diagnosis present

## 2016-05-20 DIAGNOSIS — R410 Disorientation, unspecified: Secondary | ICD-10-CM

## 2016-05-20 DIAGNOSIS — Z823 Family history of stroke: Secondary | ICD-10-CM

## 2016-05-20 DIAGNOSIS — G92 Toxic encephalopathy: Principal | ICD-10-CM | POA: Diagnosis present

## 2016-05-20 DIAGNOSIS — K219 Gastro-esophageal reflux disease without esophagitis: Secondary | ICD-10-CM | POA: Diagnosis present

## 2016-05-20 DIAGNOSIS — Z832 Family history of diseases of the blood and blood-forming organs and certain disorders involving the immune mechanism: Secondary | ICD-10-CM

## 2016-05-20 DIAGNOSIS — I10 Essential (primary) hypertension: Secondary | ICD-10-CM | POA: Diagnosis present

## 2016-05-20 DIAGNOSIS — E785 Hyperlipidemia, unspecified: Secondary | ICD-10-CM | POA: Diagnosis present

## 2016-05-20 DIAGNOSIS — Z833 Family history of diabetes mellitus: Secondary | ICD-10-CM

## 2016-05-20 DIAGNOSIS — I2510 Atherosclerotic heart disease of native coronary artery without angina pectoris: Secondary | ICD-10-CM | POA: Diagnosis present

## 2016-05-20 DIAGNOSIS — Z86711 Personal history of pulmonary embolism: Secondary | ICD-10-CM

## 2016-05-20 DIAGNOSIS — I4589 Other specified conduction disorders: Secondary | ICD-10-CM | POA: Diagnosis present

## 2016-05-20 DIAGNOSIS — E1151 Type 2 diabetes mellitus with diabetic peripheral angiopathy without gangrene: Secondary | ICD-10-CM | POA: Diagnosis present

## 2016-05-20 DIAGNOSIS — G4733 Obstructive sleep apnea (adult) (pediatric): Secondary | ICD-10-CM | POA: Diagnosis present

## 2016-05-20 DIAGNOSIS — F29 Unspecified psychosis not due to a substance or known physiological condition: Secondary | ICD-10-CM | POA: Diagnosis not present

## 2016-05-20 DIAGNOSIS — N390 Urinary tract infection, site not specified: Secondary | ICD-10-CM | POA: Diagnosis present

## 2016-05-20 DIAGNOSIS — G928 Other toxic encephalopathy: Secondary | ICD-10-CM | POA: Diagnosis present

## 2016-05-20 DIAGNOSIS — F411 Generalized anxiety disorder: Secondary | ICD-10-CM | POA: Diagnosis present

## 2016-05-20 DIAGNOSIS — Z882 Allergy status to sulfonamides status: Secondary | ICD-10-CM

## 2016-05-20 DIAGNOSIS — Z8673 Personal history of transient ischemic attack (TIA), and cerebral infarction without residual deficits: Secondary | ICD-10-CM

## 2016-05-20 DIAGNOSIS — R112 Nausea with vomiting, unspecified: Secondary | ICD-10-CM | POA: Diagnosis not present

## 2016-05-20 DIAGNOSIS — B49 Unspecified mycosis: Secondary | ICD-10-CM | POA: Diagnosis present

## 2016-05-20 DIAGNOSIS — Z794 Long term (current) use of insulin: Secondary | ICD-10-CM

## 2016-05-20 DIAGNOSIS — I495 Sick sinus syndrome: Secondary | ICD-10-CM | POA: Diagnosis present

## 2016-05-20 DIAGNOSIS — Z888 Allergy status to other drugs, medicaments and biological substances status: Secondary | ICD-10-CM

## 2016-05-20 DIAGNOSIS — J45909 Unspecified asthma, uncomplicated: Secondary | ICD-10-CM | POA: Diagnosis present

## 2016-05-20 DIAGNOSIS — F329 Major depressive disorder, single episode, unspecified: Secondary | ICD-10-CM | POA: Diagnosis present

## 2016-05-20 DIAGNOSIS — E876 Hypokalemia: Secondary | ICD-10-CM | POA: Diagnosis present

## 2016-05-20 DIAGNOSIS — I447 Left bundle-branch block, unspecified: Secondary | ICD-10-CM | POA: Diagnosis present

## 2016-05-20 DIAGNOSIS — F23 Brief psychotic disorder: Secondary | ICD-10-CM | POA: Diagnosis not present

## 2016-05-20 DIAGNOSIS — I4891 Unspecified atrial fibrillation: Secondary | ICD-10-CM | POA: Diagnosis present

## 2016-05-20 DIAGNOSIS — Z881 Allergy status to other antibiotic agents status: Secondary | ICD-10-CM

## 2016-05-20 DIAGNOSIS — Z79899 Other long term (current) drug therapy: Secondary | ICD-10-CM

## 2016-05-20 DIAGNOSIS — Z981 Arthrodesis status: Secondary | ICD-10-CM | POA: Diagnosis not present

## 2016-05-20 DIAGNOSIS — Z96651 Presence of right artificial knee joint: Secondary | ICD-10-CM | POA: Diagnosis present

## 2016-05-20 DIAGNOSIS — Z885 Allergy status to narcotic agent status: Secondary | ICD-10-CM

## 2016-05-20 DIAGNOSIS — Z90710 Acquired absence of both cervix and uterus: Secondary | ICD-10-CM

## 2016-05-20 DIAGNOSIS — Z8249 Family history of ischemic heart disease and other diseases of the circulatory system: Secondary | ICD-10-CM

## 2016-05-20 DIAGNOSIS — Z950 Presence of cardiac pacemaker: Secondary | ICD-10-CM | POA: Diagnosis not present

## 2016-05-20 DIAGNOSIS — K56699 Other intestinal obstruction unspecified as to partial versus complete obstruction: Secondary | ICD-10-CM | POA: Diagnosis not present

## 2016-05-20 DIAGNOSIS — Z7982 Long term (current) use of aspirin: Secondary | ICD-10-CM

## 2016-05-20 DIAGNOSIS — R4182 Altered mental status, unspecified: Secondary | ICD-10-CM | POA: Diagnosis not present

## 2016-05-20 DIAGNOSIS — Z880 Allergy status to penicillin: Secondary | ICD-10-CM

## 2016-05-20 DIAGNOSIS — Z91041 Radiographic dye allergy status: Secondary | ICD-10-CM

## 2016-05-20 DIAGNOSIS — E1165 Type 2 diabetes mellitus with hyperglycemia: Secondary | ICD-10-CM | POA: Diagnosis present

## 2016-05-20 LAB — RAPID URINE DRUG SCREEN, HOSP PERFORMED
AMPHETAMINES: NOT DETECTED
BARBITURATES: NOT DETECTED
Benzodiazepines: NOT DETECTED
Cocaine: NOT DETECTED
OPIATES: POSITIVE — AB
TETRAHYDROCANNABINOL: NOT DETECTED

## 2016-05-20 LAB — CBC
HEMATOCRIT: 42.3 % (ref 36.0–46.0)
HEMOGLOBIN: 14.7 g/dL (ref 12.0–15.0)
MCH: 27.9 pg (ref 26.0–34.0)
MCHC: 34.8 g/dL (ref 30.0–36.0)
MCV: 80.4 fL (ref 78.0–100.0)
Platelets: 220 10*3/uL (ref 150–400)
RBC: 5.26 MIL/uL — AB (ref 3.87–5.11)
RDW: 13.8 % (ref 11.5–15.5)
WBC: 8.9 10*3/uL (ref 4.0–10.5)

## 2016-05-20 LAB — CBC WITH DIFFERENTIAL/PLATELET
Basophils Absolute: 0 10*3/uL (ref 0.0–0.1)
Basophils Relative: 0 %
EOS ABS: 0.1 10*3/uL (ref 0.0–0.7)
EOS PCT: 1 %
HCT: 42 % (ref 36.0–46.0)
Hemoglobin: 14.3 g/dL (ref 12.0–15.0)
LYMPHS ABS: 2.1 10*3/uL (ref 0.7–4.0)
Lymphocytes Relative: 30 %
MCH: 27.6 pg (ref 26.0–34.0)
MCHC: 34 g/dL (ref 30.0–36.0)
MCV: 80.9 fL (ref 78.0–100.0)
MONO ABS: 0.5 10*3/uL (ref 0.1–1.0)
Monocytes Relative: 7 %
Neutro Abs: 4.3 10*3/uL (ref 1.7–7.7)
Neutrophils Relative %: 62 %
Platelets: 166 10*3/uL (ref 150–400)
RBC: 5.19 MIL/uL — AB (ref 3.87–5.11)
RDW: 14 % (ref 11.5–15.5)
WBC: 7 10*3/uL (ref 4.0–10.5)

## 2016-05-20 LAB — URINALYSIS, ROUTINE W REFLEX MICROSCOPIC
GLUCOSE, UA: NEGATIVE mg/dL
Ketones, ur: 5 mg/dL — AB
NITRITE: NEGATIVE
PROTEIN: 100 mg/dL — AB
SPECIFIC GRAVITY, URINE: 1.029 (ref 1.005–1.030)
pH: 5 (ref 5.0–8.0)

## 2016-05-20 LAB — COMPREHENSIVE METABOLIC PANEL
ALBUMIN: 4.1 g/dL (ref 3.5–5.0)
ALT: 18 U/L (ref 14–54)
AST: 27 U/L (ref 15–41)
Alkaline Phosphatase: 119 U/L (ref 38–126)
Anion gap: 8 (ref 5–15)
BUN: 8 mg/dL (ref 6–20)
CO2: 31 mmol/L (ref 22–32)
CREATININE: 1.04 mg/dL — AB (ref 0.44–1.00)
Calcium: 10.2 mg/dL (ref 8.9–10.3)
Chloride: 98 mmol/L — ABNORMAL LOW (ref 101–111)
GFR calc Af Amer: >60 mL/min (ref >60)
GFR calc non Af Amer: 52 mL/min — ABNORMAL LOW (ref >60)
GLUCOSE: 119 mg/dL — AB (ref 65–99)
POTASSIUM: 3.3 mmol/L — AB (ref 3.5–5.1)
Sodium: 137 mmol/L (ref 135–145)
Total Bilirubin: 0.8 mg/dL (ref 0.3–1.2)
Total Protein: 7 g/dL (ref 6.5–8.1)

## 2016-05-20 LAB — CREATININE, SERUM
Creatinine, Ser: 0.93 mg/dL (ref 0.44–1.00)
GFR, EST NON AFRICAN AMERICAN: 60 mL/min — AB (ref >60)

## 2016-05-20 LAB — ETHANOL

## 2016-05-20 LAB — GLUCOSE, CAPILLARY
Glucose-Capillary: 78 mg/dL (ref 65–99)
Glucose-Capillary: 123 mg/dL — ABNORMAL HIGH (ref 65–99)

## 2016-05-20 MED ORDER — LEVOFLOXACIN IN D5W 250 MG/50ML IV SOLN
250.0000 mg | INTRAVENOUS | Status: DC
  Administered 2016-05-20 – 2016-05-21 (×2): 250 mg via INTRAVENOUS
  Filled 2016-05-20 (×2): qty 50

## 2016-05-20 MED ORDER — INSULIN NPH (HUMAN) (ISOPHANE) 100 UNIT/ML ~~LOC~~ SUSP
60.0000 [IU] | Freq: Two times a day (BID) | SUBCUTANEOUS | Status: DC
  Administered 2016-05-20 – 2016-05-21 (×2): 60 [IU] via SUBCUTANEOUS
  Filled 2016-05-20: qty 10

## 2016-05-20 MED ORDER — DULOXETINE HCL 60 MG PO CPEP
90.0000 mg | ORAL_CAPSULE | Freq: Every day | ORAL | Status: DC
  Administered 2016-05-20 – 2016-05-22 (×3): 90 mg via ORAL
  Filled 2016-05-20 (×3): qty 1

## 2016-05-20 MED ORDER — HYDROCODONE-ACETAMINOPHEN 5-325 MG PO TABS
1.0000 | ORAL_TABLET | Freq: Once | ORAL | Status: AC
  Administered 2016-05-20: 1 via ORAL
  Filled 2016-05-20: qty 1

## 2016-05-20 MED ORDER — POLYETHYLENE GLYCOL 3350 17 GM/SCOOP PO POWD
17.0000 g | Freq: Every day | ORAL | Status: DC | PRN
Start: 2016-05-20 — End: 2016-05-20
  Filled 2016-05-20: qty 255

## 2016-05-20 MED ORDER — ATORVASTATIN CALCIUM 10 MG PO TABS
20.0000 mg | ORAL_TABLET | Freq: Every day | ORAL | Status: DC
  Administered 2016-05-21: 20 mg via ORAL
  Filled 2016-05-20: qty 2

## 2016-05-20 MED ORDER — INSULIN NPH (HUMAN) (ISOPHANE) 100 UNIT/ML ~~LOC~~ SUSP
60.0000 [IU] | Freq: Two times a day (BID) | SUBCUTANEOUS | Status: DC
  Filled 2016-05-20: qty 10

## 2016-05-20 MED ORDER — SODIUM CHLORIDE 0.9 % IV BOLUS (SEPSIS)
500.0000 mL | Freq: Once | INTRAVENOUS | Status: AC
  Administered 2016-05-20: 500 mL via INTRAVENOUS

## 2016-05-20 MED ORDER — CYCLOSPORINE 0.05 % OP EMUL
1.0000 [drp] | Freq: Two times a day (BID) | OPHTHALMIC | Status: DC
  Administered 2016-05-20 – 2016-05-22 (×4): 1 [drp] via OPHTHALMIC
  Filled 2016-05-20 (×4): qty 1

## 2016-05-20 MED ORDER — NITROFURANTOIN MONOHYD MACRO 100 MG PO CAPS
100.0000 mg | ORAL_CAPSULE | Freq: Once | ORAL | Status: DC
  Filled 2016-05-20 (×2): qty 1

## 2016-05-20 MED ORDER — ASPIRIN EC 81 MG PO TBEC
81.0000 mg | DELAYED_RELEASE_TABLET | Freq: Every day | ORAL | Status: DC
  Administered 2016-05-20 – 2016-05-22 (×3): 81 mg via ORAL
  Filled 2016-05-20 (×3): qty 1

## 2016-05-20 MED ORDER — ENOXAPARIN SODIUM 40 MG/0.4ML ~~LOC~~ SOLN
40.0000 mg | SUBCUTANEOUS | Status: DC
  Filled 2016-05-20 (×2): qty 0.4

## 2016-05-20 MED ORDER — GLUCOSE BLOOD VI STRP: 1.0000 | ORAL_STRIP | Freq: Two times a day (BID) | Status: DC

## 2016-05-20 MED ORDER — POLYETHYLENE GLYCOL 3350 17 G PO PACK: 17.0000 g | PACK | Freq: Every day | ORAL | Status: DC | PRN

## 2016-05-20 MED ORDER — INSULIN ASPART 100 UNIT/ML ~~LOC~~ SOLN: 0.0000 [IU] | Freq: Three times a day (TID) | SUBCUTANEOUS | Status: DC

## 2016-05-20 MED ORDER — PHENAZOPYRIDINE HCL 200 MG PO TABS
200.0000 mg | ORAL_TABLET | Freq: Once | ORAL | Status: DC
  Filled 2016-05-20: qty 1

## 2016-05-20 MED ORDER — SODIUM CHLORIDE 0.9 % IV SOLN
INTRAVENOUS | Status: DC
  Administered 2016-05-20 – 2016-05-21 (×2): via INTRAVENOUS

## 2016-05-20 MED ORDER — NITROFURANTOIN MONOHYD MACRO 100 MG PO CAPS: 100.0000 mg | ORAL_CAPSULE | Freq: Two times a day (BID) | ORAL | 0 refills | Status: DC

## 2016-05-20 MED ORDER — NEBIVOLOL HCL 10 MG PO TABS
10.0000 mg | ORAL_TABLET | Freq: Two times a day (BID) | ORAL | Status: DC
  Administered 2016-05-20 – 2016-05-22 (×4): 10 mg via ORAL
  Filled 2016-05-20 (×4): qty 1

## 2016-05-20 NOTE — ED Notes (Signed)
Hospitalist paged per husband request.

## 2016-05-20 NOTE — Progress Notes (Signed)
Notified Dr. Sarita [place], emesis and no cbg's ordered. New orders noted.
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
## 2016-05-20 NOTE — ED Notes (Signed)
Pt refused to take her PO meds, pt is aggressive ad combative toward this Clinical research associatewriter.

## 2016-05-20 NOTE — ED Provider Notes (Signed)
[facility]-EMERGENCY DEPT Provider Note   CSN: [id number] Arrival date & time: 05/20/16  1041     History   Chief Complaint Chief Complaint  Patient presents with  . Altered Mental Status    HPI [name] is a 71 y.o. female.  HPI Patient is brought to the emergency department by her husband who reports increasing confusion over the past week and increased occasional aggressive behavior.  Reports of decrease oral intake over the past week.  No known fevers.  No vomiting.  No diarrhea.    Past Medical History:  Diagnosis Date  . Allergic rhinitis, cause unspecified   . Anxiety state, unspecified   . Arthritis   . Atrial fibrillation (HCC)   . Bronchitis, not specified as acute or chronic   . Cardiac pacemaker medtronic   . 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 Depressive disorder, not elsewhere classified   . Disorders of sacrum   . GERD (gastroesophageal reflux disease)   . Headache(784.0)    history of  . Heart murmur   . History of hiatal hernia   . Hypertension   . LBBB (left bundle branch block)   . Myalgia and myositis, unspecified   . Obstructive sleep apnea (adult) (pediatric)   . Other voice and resonance disorders   . Pain in joint, lower leg    bilateral  . Presence of permanent cardiac pacemaker   . Pulmonary embolism (HCC)   . Rash, skin 2 weeks per patient   right arm, inner aspect    . Rheumatic fever in pediatric patient   . Total knee replacement status   . Type II or unspecified type diabetes mellitus without mention of complication, not stated as uncontrolled    Type II  . Unspecified asthma(493.90)   . 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 Vertigo    Meclizine (Antivert)    Patient Active Problem List   Diagnosis Date Noted  . Cervical spinal stenosis 04/27/2016  . Aftercare following surgery 04/27/2016  . UTI (urinary tract infection) 10/22/2015  .  Hyponatremia 10/22/2015  . Hypokalemia 10/22/2015  . AKI (acute kidney injury) (HCC) 10/22/2015  . Delirium 10/21/2015  . Asymptomatic carotid artery stenosis 10/31/2014  . Left carotid bruit 08/26/2013  . Palpitations 06/24/2013  . 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 Hypertension   . Itching 01/03/2013  . GERD (gastroesophageal reflux disease) 11/23/2011  . PULMONARY EMBOLISM, hx of 12/22/2008  . RHINOCONJUNCTIVITIS, ALLERGIC 12/20/2007  . SACROILIAC JOINT DYSFUNCTION 09/28/2007  . Depressive disorder, not elsewhere classified 08/28/2007  . KNEE PAIN, RIGHT 08/28/2007  . PAIN IN JOINT OTHER SPECIFIED SITES 08/28/2007  . 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 CARDIAC PACEMAKER IN SITU 06/06/2007  . Diabetes mellitus, type 2 (HCC) 05/16/2007  . ANXIETY 05/16/2007  . Obstructive sleep apnea 05/16/2007  . H/O: CVA (cerebrovascular accident) 05/16/2007  . BRONCHITIS 05/16/2007  . FIBROMYALGIA 05/16/2007  . FACIAL PAIN 05/16/2007  . DYSPHONIA 05/16/2007  . OTHER DYSPHAGIA 05/16/2007    Past Surgical History:  Procedure Laterality Date  . ABDOMINAL HYSTERECTOMY    . ANTERIOR CERVICAL DECOMP/DISCECTOMY FUSION N/A 04/27/2016   Procedure: C5-6, C6-7 Anterior Cervical Discectomy and Fusion, Allograft, Plate;  Surgeon: Eldred Manges, MD;  Location: [facility] OR;  Service: Orthopedics;  Laterality: N/A;  . BALLOON DILATION N/A 06/26/2013   Procedure: BALLOON DILATION;  Surgeon: Vertell Novak., MD;  Location: [facility];  Service: Endoscopy;  Laterality: N/A;  . BILATERAL SALPINGOOPHORECTOMY  1991  . CHOLECYSTECTOMY    . COLONOSCOPY N/A 06/26/2013   Procedure: COLONOSCOPY;  Surgeon: Vertell Novak., MD;  Location: [facility];  Service: Endoscopy;  Laterality: N/A;  . ELBOW SURGERY Bilateral   . ENDARTERECTOMY Left 10/31/2014   Procedure: LEFT CAROTID ENDARTERECTOMY WITH BOVINE PERICARDIAL PATCH ANGIOPLASTY;  Surgeon: Nada Libman, MD;  Location: [facility];   Service: Vascular;  Laterality: Left;  . ESOPHAGOGASTRODUODENOSCOPY  11/02/2011   Procedure: ESOPHAGOGASTRODUODENOSCOPY (EGD);  Surgeon: Vertell Novak., MD;  Location: [facility];  Service: Endoscopy;  Laterality: N/A;  with xray  . ESOPHAGOGASTRODUODENOSCOPY N/A 06/26/2013   Procedure: ESOPHAGOGASTRODUODENOSCOPY (EGD);  Surgeon: Vertell Novak., MD;  Location: [facility];  Service: Endoscopy;  Laterality: N/A;  . INSERT / REPLACE / REMOVE PACEMAKER     insertion 2007  . NASAL FRACTURE SURGERY    . SAVORY DILATION  11/02/2011   Procedure: SAVORY DILATION;  Surgeon: Vertell Novak., MD;  Location: [facility];  Service: Endoscopy;  Laterality: N/A;  . TEMPOROMANDIBULAR JOINT ARTHROPLASTY  1982  . TOTAL KNEE ARTHROPLASTY  2001, 2010, 2012   right  . total knee repair  2010   right  . VESICOVAGINAL FISTULA CLOSURE W/ TAH  1974    OB History    No data available       Home Medications    Prior to Admission medications   Medication Sig Start Date End Date Taking? Authorizing Provider  aspirin EC 81 MG tablet Take 81 mg by mouth daily.    Historical Provider, MD  atorvastatin (LIPITOR) 20 MG tablet Take 20 mg by mouth daily.      Historical Provider, MD  BD PEN NEEDLE NANO U/F 32G X 4 MM MISC Use as directed for Insulin Injections 07/01/11   Historical Provider, MD  cycloSPORINE (RESTASIS) 0.05 % ophthalmic emulsion Place 1 drop into both eyes 2 (two) times daily.    Historical Provider, MD  diltiazem (CARDIZEM CD) 120 MG 24 hr capsule Take 1 capsule (120 mg total) by mouth daily. 02/02/16   Renee Norberto Sorenson, PA-C  DULoxetine (CYMBALTA) 30 MG capsule Take 90 mg by mouth daily.     Historical Provider, MD  EPINEPHrine 0.3 mg/0.3 mL IJ SOAJ injection Inject 0.3 mg into the muscle as needed (FOR ALLERGIES).    Historical Provider, MD  HUMULIN N KWIKPEN 100 UNIT/ML Kiwkpen Inject 60 Units into the skin 2 (two) times daily. 02/16/16   Historical Provider, MD    hydrochlorothiazide (HYDRODIURIL) 25 MG tablet Take 0.5 tablets (12.5 mg total) by mouth daily. 04/27/15   Duke Salvia, MD  HYDROcodone-acetaminophen (NORCO) 5-325 MG tablet Take 1-2 tablets by mouth every 6 (six) hours as needed for moderate pain. 04/28/16   Eldred Manges, MD  nebivolol (BYSTOLIC) 10 MG tablet TAKE 1 TABLET BY MOUTH TWICE A DAY. Patient taking differently: Take 10 mg by mouth 2 (two) times daily.  03/17/16   Janetta Hora, PA-C  NOVOLOG FLEXPEN 100 UNIT/ML FlexPen Inject into the skin 3 (three) times daily with meals. Sliding scale 12/09/15   Historical Provider, MD  Omega-3 Fatty Acids (FISH OIL) 1200 MG CAPS Take 2,400 mg by mouth daily.     Historical Provider, MD  omeprazole-sodium bicarbonate (ZEGERID) 40-1100 MG per capsule Take 1 capsule by mouth at bedtime.      Historical Provider, MD  [facility] [place] VERIO test strip 1 each  by Other route 2 (two) times daily.  12/15/15   Historical Provider, MD  polyethylene glycol powder (GLYCOLAX/MIRALAX) powder Take 17 g by mouth daily as needed. For constipation. 01/17/16   Historical Provider, MD  traZODone (DESYREL) 50 MG tablet Take 25 mg by mouth at bedtime.    Historical Provider, MD    Family History Family History  Problem Relation Age of Onset  . Cancer Father     LUNG  . Alcohol abuse Father   . Diabetes Mother   . Heart disease Mother   . Stroke Mother   . Heart attack Mother   . Hypertension Mother   . Deep vein thrombosis Mother   . Hyperlipidemia Mother   . Varicose Veins Mother   . Peripheral vascular disease Mother     amputation  . Lupus Sister   . Diabetes Sister   . Aneurysm Sister   . Deep vein thrombosis Sister   . Hyperlipidemia Sister   . Hypertension Sister   . Varicose Veins Sister   . Heart disease Sister   . Deep vein thrombosis Sister   . Diabetes Sister   . Hyperlipidemia Sister   . Hypertension Sister   . Varicose Veins Sister     Social History Social History  Substance Use  Topics  . Smoking status: Former Smoker    Packs/day: 0.30    Years: 20.00    Types: Cigarettes    Quit date: 2007  . Smokeless tobacco: Never Used     Comment: pt smoked less than 1/4 ppd  . Alcohol use No     Allergies   Cephalexin; Bactrim [sulfamethoxazole-trimethoprim]; Penicillins; Atenolol; Chromic sulfate; Codeine; Contrast media [iodinated diagnostic agents]; Desmopressin; Dexilant [dexlansoprazole]; Doxycycline; Entex; Farxiga [dapagliflozin]; Gabapentin; Levaquin [levofloxacin]; Lexapro [escitalopram]; Lisinopril; Losartan potassium; Metformin; Other; Pregabalin; Rosiglitazone maleate; Simvastatin; Sitagliptin; and Tradjenta [linagliptin]   Review of Systems Review of Systems  All other systems reviewed and are negative.    Physical Exam Updated Vital Signs BP (!) 180/89   Pulse 91   Temp 98.6 F (37 C) (Rectal)   Resp 18   SpO2 99%   Physical Exam  Constitutional: She appears well-developed and well-nourished. No distress.  HENT:  Head: Normocephalic and atraumatic.  Eyes: EOM are normal.  Neck: Normal range of motion.  Cardiovascular: Normal rate, regular rhythm and normal heart sounds.   Pulmonary/Chest: Effort normal and breath sounds normal.  Abdominal: Soft. She exhibits no distension. There is no tenderness.  Musculoskeletal: Normal range of motion.  Neurological: She is alert.  Oriented to person but not year or month or day the week  Skin: Skin is warm and dry.  Psychiatric: She has a normal mood and affect. Judgment normal.  Nursing note and vitals reviewed.    ED Treatments / Results  Labs (all labs ordered are listed, but only abnormal results are displayed) Labs Reviewed  CBC WITH DIFFERENTIAL/PLATELET - Abnormal; Notable for the following:       Result Value   RBC 5.19 (*)    All other components within normal limits  RAPID URINE DRUG SCREEN, HOSP PERFORMED - Abnormal; Notable for the following:    Opiates POSITIVE (*)    All other  components within normal limits  URINALYSIS, ROUTINE W REFLEX MICROSCOPIC - Abnormal; Notable for the following:    APPearance TURBID (*)    Hgb urine dipstick SMALL (*)    Bilirubin Urine SMALL (*)    Ketones, ur 5 (*)    Protein,  ur 100 (*)    Leukocytes, UA LARGE (*)    Bacteria, UA MANY (*)    Squamous Epithelial / LPF 0-5 (*)    All other components within normal limits  COMPREHENSIVE METABOLIC PANEL - Abnormal; Notable for the following:    Potassium 3.3 (*)    Chloride 98 (*)    Glucose, Bld 119 (*)    Creatinine, Ser 1.04 (*)    GFR calc non Af Amer 52 (*)    All other components within normal limits  URINE CULTURE  ETHANOL    EKG  EKG Interpretation None       Radiology Ct Head Wo Contrast  Result Date: 05/20/2016 CLINICAL DATA:  Increasing confusion.  No reported injury. EXAM: CT HEAD WITHOUT CONTRAST TECHNIQUE: Contiguous axial images were obtained from the base of the skull through the vertex without intravenous contrast. COMPARISON:  10/21/2015 head CT. FINDINGS: Brain: No evidence of parenchymal hemorrhage or extra-axial fluid collection. No mass lesion, mass effect, or midline shift. No CT evidence of acute infarction. Generalized cerebral volume loss. Nonspecific mild-to-moderate subcortical and periventricular white matter hypodensity, most in keeping with chronic small vessel ischemic change. Cerebral ventricle sizes are stable and concordant with the degree of cerebral volume loss. Vascular: No hyperdense vessel or unexpected calcification. Skull: No evidence of calvarial fracture. Sinuses/Orbits: The visualized paranasal sinuses are essentially clear. Other: The mastoid air cells are unopacified. Partially visualized surgical hardware from ACDF in the lower cervical spine on the lateral scout view. IMPRESSION: 1.  No evidence of acute intracranial abnormality. 2. Generalized cerebral volume loss and mild-to-moderate chronic small vessel ischemia. Electronically  Signed   By: Delbert Phenix M.D.   On: 05/20/2016 12:44    Procedures Procedures (including critical care time)  Medications Ordered in ED Medications  phenazopyridine (PYRIDIUM) tablet 200 mg (200 mg Oral Refused 05/20/16 1450)  nitrofurantoin (macrocrystal-monohydrate) (MACROBID) capsule 100 mg (100 mg Oral Refused 05/20/16 1450)     Initial Impression / Assessment and Plan / ED Course  I have reviewed the triage vital signs and the nursing notes.  Pertinent labs & imaging results that were available during my care of the patient were reviewed by me and considered in my medical decision making (see chart for details).    Increasing confusion with urinary tract infection.  Patient has been somewhat hostile while here in the emergency department.  Patient be admitted for confusion and altered mental status in the setting of infection.  Final Clinical Impressions(s) / ED Diagnoses   Final diagnoses:  Confusion  Delirium  Lower urinary tract infectious disease    New Prescriptions New Prescriptions   No medications on file     Azalia Bilis, MD 05/20/16 1533

## 2016-05-20 NOTE — Progress Notes (Signed)
Pharmacy Antibiotic Note  [name] GlazeLinda J Bukhari is a 71 y.o. female admitted on 05/20/2016 with encephalopathy.  Pharmacy has been consulted for Levofloxacin dosing.  Plan:  Levofloxacin 250 mg IV q 24 hr; usual course for complicated UTI is 10 days  Note patient with GI intolerance to oral Levaquin     Temp (24hrs), Avg:98.6 F (37 C), Min:98.6 F (37 C), Max:98.6 F (37 C)   Recent Labs Lab 05/20/16 1138 05/20/16 1233  WBC 7.0  --   CREATININE  --  1.04*    CrCl cannot be calculated (Unknown ideal weight.).    Allergies  Allergen Reactions  . Cephalexin Swelling    Tongue and lip swelling  . Bactrim [Sulfamethoxazole-Trimethoprim] Nausea And Vomiting  . Penicillins Nausea And Vomiting and Swelling    Oral Cephalosporins cause Tongue and Lip Swelling  . Atenolol     Light headed/hair loss  . Chromic Sulfate Other (See Comments)    Chromic sutures--arm swells  . Codeine Nausea Only  . Contrast Media [Iodinated Diagnostic Agents] Other (See Comments)    Lips felt like on fire, rash, itching, lip peeling  . Desmopressin Other (See Comments)    Patient is unsure of reaction  . Dexilant [Dexlansoprazole] Diarrhea  . Doxycycline Nausea And Vomiting  . Entex     REACTION: unknown  . Marcelline DeistFarxiga [Dapagliflozin] Other (See Comments)    Patient is unsure of reaction  . Gabapentin     REACTION: unknown  . Levaquin [Levofloxacin] Other (See Comments)    GI upset  . Lexapro [Escitalopram] Other (See Comments)    Weight gain  . Lisinopril Other (See Comments)    Unsure of reaction.  . Losartan Potassium Other (See Comments)  . Metformin     REACTION: unknown  . Other     Bellaryl  . Pregabalin Other (See Comments)    REACTION: unknown  . Rosiglitazone Maleate Other (See Comments)    REACTION: unknown  . Simvastatin Other (See Comments)    Muscle aches.  . Sitagliptin Other (See Comments)    Muscle ache  . Tradjenta [Linagliptin] Other (See Comments)    Joint pain      Thank you for allowing pharmacy to be a part of this patient's care.  Bernadene Personrew Madysun Thall, PharmD, BCPS Pager: [phone] 05/20/2016, 7:41 PM

## 2016-05-20 NOTE — ED Notes (Signed)
Pt ambulated without difficulty or SOB with a walker.

## 2016-05-20 NOTE — ED Notes (Addendum)
Husband Filbert Bertholdick Stecklein- [phone]

## 2016-05-20 NOTE — H&P (Signed)
[facility] History and Physical  [name] ASN:[id number] DOB: 1945-06-22 DOA: 05/20/2016  Referring physician: Venora Maples PCP: Merrilee Seashore, MD  Specialists: none  Chief Complaint: confusions  HPI: 68 ? afib not on AC-PPM placement DVT + PE not on Coumadin PVD s/p L CEA 10/2014 Prior CVA CAd-Cath 2005-Myoview 2008 no isch DM ty II colon polpy OSA Some hallucinations in the past Dementia HTN Recent c5-6 c6-7 ACDF  Came to ED 2/2 to confusion UA showed Lueks-Nitrites neg-turbid-no WBC, K 3.3 CT head neg  initially in ED confused Husband states meter doesn't work at home sugar has been ? ? 4-500 She also has some underlying dizzyness in the past and fell 1-2 days prior Husband reports becoming unmanageable at home No new meds-just not eating nor drinking well and talking funny When I chat with her she knws name, place person and orients well She is thirsty Denies f,chill,n,v,diarr,cp, No unilat weaknesseness, chest pain, syncope, dyspnea on exertion, peripheral edema, balance deficits, hemoptysis, abdominal pain, melena, hematochezia, severe indigestion/heartburn, hematuria, incontinence, genital sores, muscle weakness, suspicious skin lesions, transient blindness, difficulty walking, depression, unusual weight change, abnormal bleeding, enlarged lymph nodes, angioedema, and breast masses.     in Ed she was physically aggressive with a RN-seems to have been a miscommunication about meds-she appears to be irritated, but not confused and was able to get up and off the bed-pan and follow commands and requests  Past Medical History:  Diagnosis Date  . Allergic rhinitis, cause unspecified   . Anxiety state, unspecified   . Arthritis   . Atrial fibrillation ([place])   . Bronchitis, not specified as acute or chronic   . Cardiac pacemaker medtronic   . 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 Depressive disorder, not elsewhere classified    . Disorders of sacrum   . GERD (gastroesophageal reflux disease)   . Headache(784.0)    history of  . Heart murmur   . History of hiatal hernia   . Hypertension   . LBBB (left bundle branch block)   . Myalgia and myositis, unspecified   . Obstructive sleep apnea (adult) (pediatric)   . Other voice and resonance disorders   . Pain in joint, lower leg    bilateral  . Presence of permanent cardiac pacemaker   . Pulmonary embolism ([place])   . Rash, skin 2 weeks per patient   right arm, inner aspect    . Rheumatic fever in pediatric patient   . Total knee replacement status   . Type II or unspecified type diabetes mellitus without mention of complication, not stated as uncontrolled    Type II  . Unspecified asthma(493.90)   . 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 Vertigo    Meclizine (Antivert)   Past Surgical History:  Procedure Laterality Date  . ABDOMINAL HYSTERECTOMY    . ANTERIOR CERVICAL DECOMP/DISCECTOMY FUSION N/A 04/27/2016   Procedure: C5-6, C6-7 Anterior Cervical Discectomy and Fusion, Allograft, Plate;  Surgeon: Marybelle Killings, MD;  Location: [place];  Service: Orthopedics;  Laterality: N/A;  . BALLOON DILATION N/A 06/26/2013   Procedure: Larrie Kass DILATION;  Surgeon: Winfield Cunas., MD;  Location: [facility];  Service: Endoscopy;  Laterality: N/A;  . BILATERAL SALPINGOOPHORECTOMY  1991  . CHOLECYSTECTOMY    . COLONOSCOPY N/A 06/26/2013   Procedure: COLONOSCOPY;  Surgeon: Winfield Cunas., MD;  Location: [facility];  Service: Endoscopy;  Laterality:  N/A;  . ELBOW SURGERY Bilateral   . ENDARTERECTOMY Left 10/31/2014   Procedure: LEFT CAROTID ENDARTERECTOMY WITH BOVINE PERICARDIAL PATCH ANGIOPLASTY;  Surgeon: Serafina Mitchell, MD;  Location: [facility];  Service: Vascular;  Laterality: Left;  . ESOPHAGOGASTRODUODENOSCOPY  11/02/2011   Procedure: ESOPHAGOGASTRODUODENOSCOPY (EGD);  Surgeon: Winfield Cunas., MD;   Location: [facility];  Service: Endoscopy;  Laterality: N/A;  with xray  . ESOPHAGOGASTRODUODENOSCOPY N/A 06/26/2013   Procedure: ESOPHAGOGASTRODUODENOSCOPY (EGD);  Surgeon: Winfield Cunas., MD;  Location: [facility];  Service: Endoscopy;  Laterality: N/A;  . INSERT / REPLACE / REMOVE PACEMAKER     insertion 2007  . NASAL FRACTURE SURGERY    . SAVORY DILATION  11/02/2011   Procedure: SAVORY DILATION;  Surgeon: Winfield Cunas., MD;  Location: [facility];  Service: Endoscopy;  Laterality: N/A;  . TEMPOROMANDIBULAR JOINT ARTHROPLASTY  1982  . TOTAL KNEE ARTHROPLASTY  2001, 2010, 2012   right  . total knee repair  2010   right  . VESICOVAGINAL FISTULA CLOSURE W/ TAH  1974   Social History:  Social History   Social History Narrative  . No narrative on file    Allergies  Allergen Reactions  . Cephalexin Swelling    Tongue and lip swelling  . Bactrim [Sulfamethoxazole-Trimethoprim] Nausea And Vomiting  . Penicillins Nausea And Vomiting and Swelling    Oral Cephalosporins cause Tongue and Lip Swelling  . Atenolol     Light headed/hair loss  . Chromic Sulfate Other (See Comments)    Chromic sutures--arm swells  . Codeine Nausea Only  . Contrast Media [Iodinated Diagnostic Agents] Other (See Comments)    Lips felt like on fire, rash, itching, lip peeling  . Desmopressin Other (See Comments)    Patient is unsure of reaction  . Dexilant [Dexlansoprazole] Diarrhea  . Doxycycline Nausea And Vomiting  . Entex     REACTION: unknown  . Wilder Glade [Dapagliflozin] Other (See Comments)    Patient is unsure of reaction  . Gabapentin     REACTION: unknown  . Levaquin [Levofloxacin] Other (See Comments)    GI upset  . Lexapro [Escitalopram] Other (See Comments)    Weight gain  . Lisinopril Other (See Comments)    Unsure of reaction.  . Losartan Potassium Other (See Comments)  . Metformin     REACTION: unknown  . Other     Bellaryl  . Pregabalin Other (See Comments)     REACTION: unknown  . Rosiglitazone Maleate Other (See Comments)    REACTION: unknown  . Simvastatin Other (See Comments)    Muscle aches.  . Sitagliptin Other (See Comments)    Muscle ache  . Tradjenta [Linagliptin] Other (See Comments)    Joint pain    Family History  Problem Relation Age of Onset  . Cancer Father     LUNG  . Alcohol abuse Father   . Diabetes Mother   . Heart disease Mother   . Stroke Mother   . Heart attack Mother   . Hypertension Mother   . Deep vein thrombosis Mother   . Hyperlipidemia Mother   . Varicose Veins Mother   . Peripheral vascular disease Mother     amputation  . Lupus Sister   . Diabetes Sister   . Aneurysm Sister   . Deep vein thrombosis Sister   . Hyperlipidemia Sister   . Hypertension Sister   . Varicose Veins Sister   . Heart disease Sister   . Deep  vein thrombosis Sister   . Diabetes Sister   . Hyperlipidemia Sister   . Hypertension Sister   . Varicose Veins Sister     Prior to Admission medications   Medication Sig Start Date End Date Taking? Authorizing Provider  aspirin EC 81 MG tablet Take 81 mg by mouth daily.   Yes Historical Provider, MD  atorvastatin (LIPITOR) 20 MG tablet Take 20 mg by mouth daily.     Yes Historical Provider, MD  cycloSPORINE (RESTASIS) 0.05 % ophthalmic emulsion Place 1 drop into both eyes 2 (two) times daily.   Yes Historical Provider, MD  diltiazem (CARDIZEM CD) 120 MG 24 hr capsule Take 1 capsule (120 mg total) by mouth daily. 02/02/16  Yes Renee Dyane Dustman, PA-C  DULoxetine (CYMBALTA) 30 MG capsule Take 90 mg by mouth daily.    Yes Historical Provider, MD  HUMULIN N KWIKPEN 100 UNIT/ML Kiwkpen Inject 60 Units into the skin 2 (two) times daily. 02/16/16  Yes Historical Provider, MD  hydrochlorothiazide (HYDRODIURIL) 25 MG tablet Take 0.5 tablets (12.5 mg total) by mouth daily. 04/27/15  Yes Deboraha Sprang, MD  HYDROcodone-acetaminophen (NORCO) 5-325 MG tablet Take 1-2 tablets by mouth every 6 (six)  hours as needed for moderate pain. 04/28/16  Yes Marybelle Killings, MD  nebivolol (BYSTOLIC) 10 MG tablet TAKE 1 TABLET BY MOUTH TWICE A DAY. Patient taking differently: Take 10 mg by mouth 2 (two) times daily.  03/17/16  Yes Eileen Stanford, PA-C  NOVOLOG FLEXPEN 100 UNIT/ML FlexPen Inject 12-40 Units into the skin 3 (three) times daily with meals. Pt states she has a Sliding scale but does not know her sliding scale 12/09/15  Yes Historical Provider, MD  Omega-3 Fatty Acids (FISH OIL) 1200 MG CAPS Take 2,400 mg by mouth daily.    Yes Historical Provider, MD  omeprazole-sodium bicarbonate (ZEGERID) 40-1100 MG per capsule Take 1 capsule by mouth at bedtime.     Yes Historical Provider, MD  polyethylene glycol powder (GLYCOLAX/MIRALAX) powder Take 17 g by mouth daily as needed. For constipation. 01/17/16  Yes Historical Provider, MD  traZODone (DESYREL) 50 MG tablet Take 25 mg by mouth at bedtime.   Yes Historical Provider, MD  BD PEN NEEDLE NANO U/F 32G X 4 MM MISC Use as directed for Insulin Injections 07/01/11   Historical Provider, MD  EPINEPHrine 0.3 mg/0.3 mL IJ SOAJ injection Inject 0.3 mg into the muscle as needed (FOR ALLERGIES).    Historical Provider, MD  nitrofurantoin, macrocrystal-monohydrate, (MACROBID) 100 MG capsule Take 1 capsule (100 mg total) by mouth 2 (two) times daily. 05/20/16   Jola Schmidt, MD  [facility] VERIO test strip 1 each by Other route 2 (two) times daily.  12/15/15   Historical Provider, MD   Physical Exam: Vitals:   05/20/16 1141 05/20/16 1302 05/20/16 1626  BP:  (!) 180/89 (!) 193/86  Pulse:  91 87  Resp:  18 16  Temp: 98.6 F (37 C)    TempSrc: Rectal    SpO2:  99% 98%    eomi ncat neck soft supple s1 s2 no m/r/g Face symmetric, no droop tongue protrudes midline, no jjvd abd soft nt nd-CVA non tender ROm intact, slight weaker post surgery in L side    Labs on Admission:  Basic Metabolic Panel:  Recent Labs Lab 05/20/16 1233  NA 137  K 3.3*  CL 98*   CO2 31  GLUCOSE 119*  BUN 8  CREATININE 1.04*  CALCIUM 10.2   Liver  Function Tests:  Recent Labs Lab 05/20/16 1233  AST 27  ALT 18  ALKPHOS 119  BILITOT 0.8  PROT 7.0  ALBUMIN 4.1   No results for input(s): LIPASE, AMYLASE in the last 168 hours. No results for input(s): AMMONIA in the last 168 hours. CBC:  Recent Labs Lab 05/20/16 1138  WBC 7.0  NEUTROABS 4.3  HGB 14.3  HCT 42.0  MCV 80.9  PLT 166   Cardiac Enzymes: No results for input(s): CKTOTAL, CKMB, CKMBINDEX, TROPONINI in the last 168 hours.  BNP (last 3 results) No results for input(s): BNP in the last 8760 hours.  ProBNP (last 3 results) No results for input(s): PROBNP in the last 8760 hours.  CBG: No results for input(s): GLUCAP in the last 168 hours.  Radiological Exams on Admission: Ct Head Wo Contrast  Result Date: 05/20/2016 CLINICAL DATA:  Increasing confusion.  No reported injury. EXAM: CT HEAD WITHOUT CONTRAST TECHNIQUE: Contiguous axial images were obtained from the base of the skull through the vertex without intravenous contrast. COMPARISON:  10/21/2015 head CT. FINDINGS: Brain: No evidence of parenchymal hemorrhage or extra-axial fluid collection. No mass lesion, mass effect, or midline shift. No CT evidence of acute infarction. Generalized cerebral volume loss. Nonspecific mild-to-moderate subcortical and periventricular white matter hypodensity, most in keeping with chronic small vessel ischemic change. Cerebral ventricle sizes are stable and concordant with the degree of cerebral volume loss. Vascular: No hyperdense vessel or unexpected calcification. Skull: No evidence of calvarial fracture. Sinuses/Orbits: The visualized paranasal sinuses are essentially clear. Other: The mastoid air cells are unopacified. Partially visualized surgical hardware from ACDF in the lower cervical spine on the lateral scout view. IMPRESSION: 1.  No evidence of acute intracranial abnormality. 2. Generalized  cerebral volume loss and mild-to-moderate chronic small vessel ischemia. Electronically Signed   By: Ilona Sorrel M.D.   On: 05/20/2016 12:44    EKG: Independently reviewed.   Assessment/Plan Active Problems:   Toxic metabolic encephalopathy   Admission requested for acute met enecphalopathy-her confusion is not consistent.  She is oriented tome and place time and person She however remaioned combative in ed and refused to take meds We will admit her overnight, hydrate with NS, Give IV levaquin and re-assess in am  Afib-cont bisoprolol 10 big and cardizem 120 CD.  Will enquire about no AC  Htn-HOld HCTZ 25 for now-reassess trends in am  HLd hold statin while inpat  OSa- not on cpap  uncontrolled DM-Has no meter-resume home dosing insulin N 60 bid and SSI as per protocol   PAD s/p L CEA-cont ASA-OP Vasc input    Obs Med surg Discussed with husband Likely d/c am  Verlon [facility] Pager [phone]  If 7PM-7AM, please contact night-coverage [email or web address] Password [facility] 05/20/2016, 5:45 PM

## 2016-05-20 NOTE — Consult Note (Signed)
58 ? afib not on AC-PPM placement DVT + PE not on Coumadin PVD s/p L CEA 10/2014 Prior CVA CAd-Cath 2005-Myoview 2008 no isch DM ty IIcolon polpy OSA Some hallucinations in the past Dementia HTN Recent c5-6 c6-7 ACDF  Came to ED 2/2 to confusion UA showed Lueks-Nitrites neg-turbid-no WBC, K 3.3 CT head neg  initially in ED confused Husband states meter doesn't work at home sugar has been ? ? 4-500 She also has some underlying dizzyness in the past and fell 1-2 days prior Husband reports becoming unmanageable at home No new meds-just not eating nor drinking well and talking funny When I chat with her she knws name, place person and orients well She is thirsty Denies f,chill,n,v,diarr,cp, No unilat weakness  in Ed she was physically aggressive with a RN-seems to have been a miscommunication about meds-she appears to be irritated, but not confused and was able to get up and off the bed-pan and follow commands and requests  eomi ncat neck soft supple s1 s2 no m/r/g Face symmetric, no droop tongue protrudes midline, no jjvd abd soft nt nd-CVA non tender ROm intact, slight weaker post surgery in L side   Admission requested for acute met enecphalopathy-her confusion is not consistent.  She is oriented tome and place time and person Ct head neg.  She is mildy dehydrated but very awake and drinking water at time of me re-eal I have asked that she ambulate in the ED unit with a walker [employer] should look into services to help her and family at home with Glucometer and maybe some home health I did update Dr. Venora Maples of the ED If she isn't able to ambulate-I will observe her overnight  Thanks for consult  Verneita Griffes, MD [facility] (P) [phone]

## 2016-05-20 NOTE — ED Provider Notes (Signed)
5:32 PM Pt felt to be stable for discharge by the hospitalist. Please see consultation note   Azalia BilisKevin Sire Poet, MD 05/20/16 1733

## 2016-05-21 ENCOUNTER — Inpatient Hospital Stay (HOSPITAL_COMMUNITY): Payer: Medicare Other

## 2016-05-21 LAB — CBC WITH DIFFERENTIAL/PLATELET
BASOS PCT: 0 %
Basophils Absolute: 0 10*3/uL (ref 0.0–0.1)
EOS PCT: 2 %
Eosinophils Absolute: 0.1 10*3/uL (ref 0.0–0.7)
HCT: 40.3 % (ref 36.0–46.0)
Hemoglobin: 13.9 g/dL (ref 12.0–15.0)
LYMPHS ABS: 2.8 10*3/uL (ref 0.7–4.0)
Lymphocytes Relative: 39 %
MCH: 27.4 pg (ref 26.0–34.0)
MCHC: 34.5 g/dL (ref 30.0–36.0)
MCV: 79.3 fL (ref 78.0–100.0)
MONO ABS: 0.5 10*3/uL (ref 0.1–1.0)
MONOS PCT: 7 %
Neutro Abs: 3.7 10*3/uL (ref 1.7–7.7)
Neutrophils Relative %: 52 %
PLATELETS: 224 10*3/uL (ref 150–400)
RBC: 5.08 MIL/uL (ref 3.87–5.11)
RDW: 13.7 % (ref 11.5–15.5)
WBC: 7.1 10*3/uL (ref 4.0–10.5)

## 2016-05-21 LAB — COMPREHENSIVE METABOLIC PANEL
ALK PHOS: 115 U/L (ref 38–126)
ALT: 15 U/L (ref 14–54)
ANION GAP: 11 (ref 5–15)
AST: 27 U/L (ref 15–41)
Albumin: 3.8 g/dL (ref 3.5–5.0)
BUN: 6 mg/dL (ref 6–20)
CALCIUM: 9.7 mg/dL (ref 8.9–10.3)
CHLORIDE: 100 mmol/L — AB (ref 101–111)
CO2: 27 mmol/L (ref 22–32)
Creatinine, Ser: 0.85 mg/dL (ref 0.44–1.00)
GFR calc non Af Amer: >60 mL/min (ref >60)
Glucose, Bld: 72 mg/dL (ref 65–99)
POTASSIUM: 2.7 mmol/L — AB (ref 3.5–5.1)
SODIUM: 138 mmol/L (ref 135–145)
Total Bilirubin: 0.9 mg/dL (ref 0.3–1.2)
Total Protein: 6.6 g/dL (ref 6.5–8.1)

## 2016-05-21 LAB — GLUCOSE, CAPILLARY
GLUCOSE-CAPILLARY: 63 mg/dL — AB (ref 65–99)
GLUCOSE-CAPILLARY: 125 mg/dL — AB (ref 65–99)
GLUCOSE-CAPILLARY: 109 mg/dL — AB (ref 65–99)
Glucose-Capillary: 119 mg/dL — ABNORMAL HIGH (ref 65–99)
Glucose-Capillary: 51 mg/dL — ABNORMAL LOW (ref 65–99)
Glucose-Capillary: 47 mg/dL — ABNORMAL LOW (ref 65–99)

## 2016-05-21 LAB — URINE CULTURE: Culture: 80000 — AB

## 2016-05-21 MED ORDER — POTASSIUM CHLORIDE CRYS ER 20 MEQ PO TBCR
60.0000 meq | EXTENDED_RELEASE_TABLET | Freq: Once | ORAL | Status: AC
  Administered 2016-05-21: 60 meq via ORAL
  Filled 2016-05-21: qty 3

## 2016-05-21 MED ORDER — POTASSIUM CHLORIDE IN NACL 40-0.9 MEQ/L-% IV SOLN
INTRAVENOUS | Status: DC
  Administered 2016-05-21 – 2016-05-22 (×2): 75 mL/h via INTRAVENOUS
  Filled 2016-05-21 (×3): qty 1000

## 2016-05-21 MED ORDER — POTASSIUM CHLORIDE 10 MEQ/100ML IV SOLN
10.0000 meq | INTRAVENOUS | Status: AC
  Administered 2016-05-21: 10 meq via INTRAVENOUS
  Filled 2016-05-21 (×2): qty 100

## 2016-05-21 MED ORDER — ONDANSETRON HCL 4 MG/2ML IJ SOLN
4.0000 mg | Freq: Once | INTRAMUSCULAR | Status: AC
  Administered 2016-05-21: 4 mg via INTRAVENOUS
  Filled 2016-05-21: qty 2

## 2016-05-21 NOTE — Progress Notes (Signed)
CBG recheck 15 min after IV Dextrose is 125. PO intake throughout day has been fairly poor. [name] Alarana A Sagan Maselli, RN

## 2016-05-21 NOTE — Progress Notes (Signed)
Notified Dr. Adah PerlKakandy of continued emesis. Xray abdomen ordered and zofran given.

## 2016-05-21 NOTE — Progress Notes (Signed)
PROGRESS NOTE    [name] GlazeLinda J [name]  ZOX:[id number] DOB: Jun 09, 1945 DOA: 05/20/2016 PCP: Georgianne FickAMACHANDRAN,AJITH, MD  Outpatient Specialists:     Brief Narrative:  [age] ? afib not on AC-PPM placement DVT + PE not on Coumadin PVD s/p L CEA 10/2014 Prior CVA CAd-Cath 2005-Myoview 2008 no isch DM ty II colon polpy OSA Some hallucinations in the past Dementia HTN Recent c5-6 c6-7 ACDF  Came to ED 2/2 to confusion UA showed Lueks-Nitrites neg-turbid-no WBC, K 3.3 CT head neg  initially in ED confused Husband states meter doesn't work at home sugar has been ?? 4-500 She also has some underlying dizzyness in the past and fell 1-2 days prior Husband reports becoming unmanageable at home No new meds-just not eating nor drinking well and talking funny   Assessment & Plan:   Active Problems:   Toxic metabolic encephalopathy   Toxic metabolic encephalopathy her confusion is not consistent. She is oriented tome and place time and person She however remaioned combative in ed and refused to take meds We will admit her overnight, hydrate with NS, Give IV levaquin and re-assess in am  n/v  Abd AXR neg.  No further symptoms Eating and drinking some now Monitor for further side effects  Possible UTI ON IV levaquin Narrow to potentially po equiv in 24 hours once know what sensitivites are  Mod-severe hypokalemia Replace with iv infusion saline 75 cc/hr with K 40 Given Kdur 60 Labs am  Afib-cont bisoprolol 10 big and cardizem 120 CD.  Will enquire about no AC  Htn-HOld HCTZ 25 for now-reassess trends in am  HLd hold statin while inpat  OSa- not on cpap  uncontrolled DM-Has no meter-resume home dosing insulin N 60 bid and SSI as per protocol   PAD s/p L CEA-cont ASA-OP Vasc input    Subjective: Fair Wants to go No cp n/v/  Objective: Vitals:   05/20/16 1837 05/20/16 1939 05/20/16 2156 05/21/16 0430  BP: (!) 186/83 (!) 184/72 119/87 (!) 169/71  Pulse: 84 86  97 86  Resp: 16 16 15 16   Temp:  98.1 F (36.7 C) 97.7 F (36.5 C) 98 F (36.7 C)  TempSrc:  Oral Oral Oral  SpO2: 96% 98% 97% 95%  Weight:  63.4 kg (139 lb 12.4 oz)    Height:  5\' 3"  (1.6 m)      Intake/Output Summary (Last 24 hours) at 05/21/16 1048 Last data filed at 05/21/16 0430  Gross per 24 hour  Intake              960 ml  Output                0 ml  Net              960 ml   Filed Weights   05/20/16 1939  Weight: 63.4 kg (139 lb 12.4 oz)    Examination:  General exam: alert   Respiratory system: Clear to auscultation. Respiratory effort normal. Cardiovascular system: S1 & S2 heard, RRR. No JVD Gastrointestinal system: Abdomen is nondistended, soft and nontender. No rebound no gaurd Central nervous system: Alert and oriented. No focal neurological deficits. Extremities: Symmetric 5 x 5 power. Skin: No rashes, lesions or ulcers Psychiatry: Judgement and insight appear normal. Mood & affect appropriate. She is not confused    Data Reviewed: I have personally reviewed following labs and imaging studies  CBC:  Recent Labs Lab 05/20/16 1138 05/20/16 1959 05/21/16 0533  WBC 7.0 8.9 7.1  NEUTROABS 4.3  --  3.7  HGB 14.3 14.7 13.9  HCT 42.0 42.3 40.3  MCV 80.9 80.4 79.3  PLT 166 220 224   Basic Metabolic Panel:  Recent Labs Lab 05/20/16 1233 05/20/16 1959 05/21/16 0533  NA 137  --  138  K 3.3*  --  2.7*  CL 98*  --  100*  CO2 31  --  27  GLUCOSE 119*  --  72  BUN 8  --  6  CREATININE 1.04* 0.93 0.85  CALCIUM 10.2  --  9.7   GFR: Estimated Creatinine Clearance (by C-G formula based on SCr of 0.85 mg/dL) Female: 60.4 mL/min Female: 63.2 mL/min Liver Function Tests:  Recent Labs Lab 05/20/16 1233 05/21/16 0533  AST 27 27  ALT 18 15  ALKPHOS 119 115  BILITOT 0.8 0.9  PROT 7.0 6.6  ALBUMIN 4.1 3.8   No results for input(s): LIPASE, AMYLASE in the last 168 hours. No results for input(s): AMMONIA in the last 168 hours. Coagulation  Profile: No results for input(s): INR, PROTIME in the last 168 hours. Cardiac Enzymes: No results for input(s): CKTOTAL, CKMB, CKMBINDEX, TROPONINI in the last 168 hours. BNP (last 3 results) No results for input(s): PROBNP in the last 8760 hours. HbA1C: No results for input(s): HGBA1C in the last 72 hours. CBG:  Recent Labs Lab 05/20/16 2026 05/20/16 2225 05/21/16 0807  GLUCAP 78 123* 63*   Lipid Profile: No results for input(s): CHOL, HDL, LDLCALC, TRIG, CHOLHDL, LDLDIRECT in the last 72 hours. Thyroid Function Tests: No results for input(s): TSH, T4TOTAL, FREET4, T3FREE, THYROIDAB in the last 72 hours. Anemia Panel: No results for input(s): VITAMINB12, FOLATE, FERRITIN, TIBC, IRON, RETICCTPCT in the last 72 hours. Urine analysis:    Component Value Date/Time   COLORURINE YELLOW 05/20/2016 1138   APPEARANCEUR TURBID (A) 05/20/2016 1138   LABSPEC 1.029 05/20/2016 1138   PHURINE 5.0 05/20/2016 1138   GLUCOSEU NEGATIVE 05/20/2016 1138   HGBUR SMALL (A) 05/20/2016 1138   BILIRUBINUR SMALL (A) 05/20/2016 1138   KETONESUR 5 (A) 05/20/2016 1138   PROTEINUR 100 (A) 05/20/2016 1138   UROBILINOGEN 0.2 10/28/2014 1321   NITRITE NEGATIVE 05/20/2016 1138   LEUKOCYTESUR LARGE (A) 05/20/2016 1138   Sepsis Labs: @LABRCNTIP (procalcitonin:4,lacticidven:4)  )No results found for this or any previous visit (from the past 240 hour(s)).       Radiology Studies: Dg Abd 1 View  Result Date: 05/21/2016 CLINICAL DATA:  Nausea and vomiting. EXAM: ABDOMEN - 1 VIEW COMPARISON:  CT abdomen and pelvis 04/13/2016 FINDINGS: Gas and stool demonstrated throughout the colon. Small amount of gas within small bowel loops. No small or large bowel distention. No radiopaque stones. Visualized bones appear intact. IMPRESSION: Nonobstructive bowel gas pattern. Electronically Signed   By: Burman Nieves M.D.   On: 05/21/2016 01:19   Ct Head Wo Contrast  Result Date: 05/20/2016 CLINICAL DATA:   Increasing confusion.  No reported injury. EXAM: CT HEAD WITHOUT CONTRAST TECHNIQUE: Contiguous axial images were obtained from the base of the skull through the vertex without intravenous contrast. COMPARISON:  10/21/2015 head CT. FINDINGS: Brain: No evidence of parenchymal hemorrhage or extra-axial fluid collection. No mass lesion, mass effect, or midline shift. No CT evidence of acute infarction. Generalized cerebral volume loss. Nonspecific mild-to-moderate subcortical and periventricular white matter hypodensity, most in keeping with chronic small vessel ischemic change. Cerebral ventricle sizes are stable and concordant with the degree of cerebral volume loss. Vascular: No hyperdense vessel or unexpected calcification.  Skull: No evidence of calvarial fracture. Sinuses/Orbits: The visualized paranasal sinuses are essentially clear. Other: The mastoid air cells are unopacified. Partially visualized surgical hardware from ACDF in the lower cervical spine on the lateral scout view. IMPRESSION: 1.  No evidence of acute intracranial abnormality. 2. Generalized cerebral volume loss and mild-to-moderate chronic small vessel ischemia. Electronically Signed   By: Delbert Phenix M.D.   On: 05/20/2016 12:44        Scheduled Meds: . aspirin EC  81 mg Oral Daily  . atorvastatin  20 mg Oral q1800  . cycloSPORINE  1 drop Both Eyes BID  . DULoxetine  90 mg Oral Daily  . enoxaparin (LOVENOX) injection  40 mg Subcutaneous Q24H  . insulin aspart  0-9 Units Subcutaneous TID AC & HS  . insulin aspart  0-9 Units Subcutaneous TID WC  . insulin NPH Human  60 Units Subcutaneous BID AC & HS  . levofloxacin (LEVAQUIN) IV  250 mg Intravenous Q24H  . nebivolol  10 mg Oral BID  . phenazopyridine  200 mg Oral Once   Continuous Infusions: . sodium chloride 100 mL/hr at 05/21/16 1003     LOS: 1 day    Time spent: 17    Pleas Koch, MD [facility] ([phone]   If 7PM-7AM, please contact  night-coverage [email or web address] Password [facility] 05/21/2016, 10:48 AM

## 2016-05-21 NOTE — Progress Notes (Signed)
CRITICAL VALUE ALERT  Critical value received:   K+ 2.7  Date of notification:  05/21/16  Time of notification:  0646  Critical value read back:Yes.    Nurse who received alert:  Curt Oatis   MD notified (1st page):  Toniann FailKakrakandy  Time of first page:  [phone] MD notified (2nd page):  Time of second page:  Responding MD: Toniann FailKakrakandy  Time MD responded: [phone]

## 2016-05-21 NOTE — Progress Notes (Signed)
Patient's CBG is 51 at 1830. Pt given 2 juices and ice cream. Recheck CBG 15 min later is 47. Pt alert and at baseline mentation. Pt given Dextrose IVP per protocol. Will recheck CBG. Melton Alarana A Tecora Eustache, RN

## 2016-05-21 NOTE — Progress Notes (Addendum)
Pt CBG 109 at 2150. Pt scheduled to get 60 units of NPH at 2200. On call notified of Low BG this afternoon.(47) A. Amin advised to hold NPH and continue with SSI and refer pt for dietary consult.

## 2016-05-22 LAB — BASIC METABOLIC PANEL
Anion gap: 7 (ref 5–15)
BUN: 5 mg/dL — AB (ref 6–20)
CO2: 22 mmol/L (ref 22–32)
CREATININE: 0.71 mg/dL (ref 0.44–1.00)
Calcium: 9.1 mg/dL (ref 8.9–10.3)
Chloride: 106 mmol/L (ref 101–111)
GFR calc Af Amer: >60 mL/min (ref >60)
Glucose, Bld: 51 mg/dL — ABNORMAL LOW (ref 65–99)
Potassium: 4.1 mmol/L (ref 3.5–5.1)
Sodium: 135 mmol/L (ref 135–145)

## 2016-05-22 LAB — GLUCOSE, CAPILLARY
GLUCOSE-CAPILLARY: 57 mg/dL — AB (ref 65–99)
Glucose-Capillary: 66 mg/dL (ref 65–99)
Glucose-Capillary: 81 mg/dL (ref 65–99)
Glucose-Capillary: 112 mg/dL — ABNORMAL HIGH (ref 65–99)

## 2016-05-22 MED ORDER — HUMULIN N KWIKPEN 100 UNIT/ML ~~LOC~~ SUPN: 10.0000 [IU] | PEN_INJECTOR | Freq: Two times a day (BID) | SUBCUTANEOUS | 11 refills | Status: AC

## 2016-05-22 MED ORDER — FLUCONAZOLE 100 MG PO TABS
200.0000 mg | ORAL_TABLET | Freq: Every day | ORAL | Status: DC
Start: 2016-05-22 — End: 2016-05-22
  Administered 2016-05-22: 200 mg via ORAL
  Filled 2016-05-22: qty 2

## 2016-05-22 MED ORDER — FLUCONAZOLE 200 MG PO TABS: 200.0000 mg | ORAL_TABLET | Freq: Every day | ORAL | 0 refills | Status: DC

## 2016-05-22 NOTE — Progress Notes (Signed)
Hypoglycemic Event  CBG: 57  Treatment: 15 GM carbohydrate snack  Symptoms: None  Follow-up CBG: Time:0904 CBG Result:66  Possible Reasons for Event: Inadequate meal intake  Comments/MD notified:hypoglycemic protocol    [name] SpiceEdwards, Ashwini Jago Coward

## 2016-05-22 NOTE — Progress Notes (Signed)
Hypoglycemic Event  CBG: 66  Treatment: 15 GM carbohydrate snack  Symptoms: None  Follow-up CBG: Time:1034 CBG Result:81  Possible Reasons for Event: Inadequate meal intake  Comments/MD notified:Dr. Mahala MenghiniSamtani notified    [name] SpiceEdwards, Aerica Rincon Coward

## 2016-05-22 NOTE — Discharge Summary (Signed)
Physician Discharge Summary  [name] WRU:[id number] DOB: 1945-03-24 DOA: 05/20/2016  PCP: Georgianne Fick, MD  Admit date: 05/20/2016 Discharge date: 05/22/2016  Time spent: 35 minutes  Recommendations for Outpatient Follow-up:  1. Finish Diflucan 5 days 2. Downward adjusted lantus to 10 bid 3. Give Monitor for CBG 4. Labs with Chem 12 1 week as was placed on difuclan  Discharge Diagnoses:  Active Problems:   Toxic metabolic encephalopathy   Discharge Condition: good  Diet recommendation: hh diabetic  Filed Weights   05/20/16 1939  Weight: 63.4 kg (139 lb 12.4 oz)    History of present illness:  70 ? afib not on AC-PPM placement DVT + PE not on Coumadin PVD s/p L CEA 10/2014 Prior CVA CAd-Cath 2005-Myoview 2008 no isch DM ty II colon polpy OSA Some hallucinations in the past Dementia HTN Recent c5-6 c6-7 ACDF  Came to ED 2/2 to confusion UA showed Lueks-Nitrites neg-turbid-no WBC, K 3.3 CT head neg  initially in ED confused Husband states meter doesn't work at home sugar has been ?? 4-500 She also has some underlying dizzyness in the past and fell 1-2 days prior Husband reports becoming unmanageable at home No new meds-just not eating nor drinking well and talking funny  Hospital Course:  Toxic metabolic encephalopathy her confusion is not consistent. She is oriented tome and place time and person She however remaioned combative in ed and refused to take meds We will admit her overnight, hydrate with NS, Give IV levaquin and re-assess in am  n/v  Abd AXR neg.  No further symptoms Eating and drinking some now No further issues  Likely fungal UTI ON IV levaquin initially sensitivities showed 80,000 yeast Placed on difulcan 200 for 5 don d/c  Mod-severe hypokalemia Replace with iv infusion saline 75 cc/hr with K 40 Given Kdur 60 Labs am show resolution-labs as per OP md 1 week  Afib-cont bisoprolol 10 big and cardizem 120 CD.   not on AC  Htn-HOld HCTZ 25 for now-reassess trends in am  HLd hold statin while inpat  OSa- not on cpap  uncontrolled DM-Has no meter-resume home dosing insulin N 60 bid and SSI as per protocol  Low CBg this admit-drop Lantus dose to 10 bid Order glucometer as op  PAD s/p L CEA-cont ASA-OP Vasc input  Discharge Exam: Vitals:   05/22/16 0649 05/22/16 1020  BP: (!) 170/62 (!) 133/103  Pulse: 70 73  Resp: 16   Temp: 97.5 F (36.4 C)     General: alert pleasant in nad Cardiovascular: s1 s 2no m/r/g Respiratory: clear no added sound  Discharge Instructions   Discharge Instructions    Diet - low sodium heart healthy    Complete by:  As directed    Increase activity slowly    Complete by:  As directed      Current Discharge Medication List    START taking these medications   Details  fluconazole (DIFLUCAN) 200 MG tablet Take 1 tablet (200 mg total) by mouth daily. Qty: 5 tablet, Refills: 0    nitrofurantoin, macrocrystal-monohydrate, (MACROBID) 100 MG capsule Take 1 capsule (100 mg total) by mouth 2 (two) times daily. Qty: 10 capsule, Refills: 0      CONTINUE these medications which have CHANGED   Details  HUMULIN N KWIKPEN 100 UNIT/ML Kiwkpen Inject 10 Units into the skin 2 (two) times daily. Qty: 15 mL, Refills: 11      CONTINUE these medications which have NOT CHANGED   Details  aspirin EC 81 MG tablet Take 81 mg by mouth daily.    atorvastatin (LIPITOR) 20 MG tablet Take 20 mg by mouth daily.      cycloSPORINE (RESTASIS) 0.05 % ophthalmic emulsion Place 1 drop into both eyes 2 (two) times daily.    diltiazem (CARDIZEM CD) 120 MG 24 hr capsule Take 1 capsule (120 mg total) by mouth daily. Qty: 90 capsule, Refills: 4    DULoxetine (CYMBALTA) 30 MG capsule Take 90 mg by mouth daily.     hydrochlorothiazide (HYDRODIURIL) 25 MG tablet Take 0.5 tablets (12.5 mg total) by mouth daily. Qty: 45 tablet, Refills: 11   Associated Diagnoses: Sinus node  dysfunction (HCC); Cardiac pacemaker in situ    HYDROcodone-acetaminophen (NORCO) 5-325 MG tablet Take 1-2 tablets by mouth every 6 (six) hours as needed for moderate pain. Qty: 40 tablet, Refills: 0    nebivolol (BYSTOLIC) 10 MG tablet TAKE 1 TABLET BY MOUTH TWICE A DAY. Qty: 180 tablet, Refills: 3    NOVOLOG FLEXPEN 100 UNIT/ML FlexPen Inject 12-40 Units into the skin 3 (three) times daily with meals. Pt states she has a Sliding scale but does not know her sliding scale    Omega-3 Fatty Acids (FISH OIL) 1200 MG CAPS Take 2,400 mg by mouth daily.     omeprazole-sodium bicarbonate (ZEGERID) 40-1100 MG per capsule Take 1 capsule by mouth at bedtime.      polyethylene glycol powder (GLYCOLAX/MIRALAX) powder Take 17 g by mouth daily as needed. For constipation.    traZODone (DESYREL) 50 MG tablet Take 25 mg by mouth at bedtime.    BD PEN NEEDLE NANO U/F 32G X 4 MM MISC Use as directed for Insulin Injections    EPINEPHrine 0.3 mg/0.3 mL IJ SOAJ injection Inject 0.3 mg into the muscle as needed (FOR ALLERGIES).    ONETOUCH VERIO test strip 1 each by Other route 2 (two) times daily.        Allergies  Allergen Reactions  . Cephalexin Swelling    Tongue and lip swelling  . Bactrim [Sulfamethoxazole-Trimethoprim] Nausea And Vomiting  . Penicillins Nausea And Vomiting and Swelling    Oral Cephalosporins cause Tongue and Lip Swelling  . Atenolol     Light headed/hair loss  . Chromic Sulfate Other (See Comments)    Chromic sutures--arm swells  . Codeine Nausea Only  . Contrast Media [Iodinated Diagnostic Agents] Other (See Comments)    Lips felt like on fire, rash, itching, lip peeling  . Desmopressin Other (See Comments)    Patient is unsure of reaction  . Dexilant [Dexlansoprazole] Diarrhea  . Doxycycline Nausea And Vomiting  . Entex     REACTION: unknown  . Marcelline DeistFarxiga [Dapagliflozin] Other (See Comments)    Patient is unsure of reaction  . Gabapentin     REACTION: unknown  .  Levaquin [Levofloxacin] Other (See Comments)    GI upset  . Lexapro [Escitalopram] Other (See Comments)    Weight gain  . Lisinopril Other (See Comments)    Unsure of reaction.  . Losartan Potassium Other (See Comments)  . Metformin     REACTION: unknown  . Other     Bellaryl  . Pregabalin Other (See Comments)    REACTION: unknown  . Rosiglitazone Maleate Other (See Comments)    REACTION: unknown  . Simvastatin Other (See Comments)    Muscle aches.  . Sitagliptin Other (See Comments)    Muscle ache  . Tradjenta [Linagliptin] Other (See Comments)    Joint  pain   Follow-up Information    RAMACHANDRAN,AJITH, MD.   Specialty:  Internal Medicine Why:  Please call your doctor for follow up in 2-3 days Contact information: [address] [place] [address] [phone]        [facility]-EMERGENCY DEPT.   Specialty:  Emergency Medicine Why:  Return to ER for any new or worsening symptoms Contact information: [address] [id number] [place] [address] [phone]           The results of significant diagnostics from this hospitalization (including imaging, microbiology, ancillary and laboratory) are listed below for reference.    Significant Diagnostic Studies: Dg Cervical Spine 2-3 Views  Result Date: 04/27/2016 CLINICAL DATA:  Anterior cervical spinal fusion at C5-C7. EXAM: CERVICAL SPINE - 2-3 VIEW COMPARISON:  CTA of the neck performed 02/03/2016 FINDINGS: Anterior cervical spinal fusion hardware is noted at C5-C7, on two provided C-arm images from the OR. Mild underlying degenerative change is noted along the cervical spine. IMPRESSION: Status post anterior cervical spinal fusion at C5-C7. Electronically Signed   By: Roanna Raider M.D.   On: 04/27/2016 19:20   Dg Abd 1 View  Result Date: 05/21/2016 CLINICAL DATA:  Nausea and vomiting. EXAM: ABDOMEN - 1 VIEW COMPARISON:  CT abdomen and pelvis  04/13/2016 FINDINGS: Gas and stool demonstrated throughout the colon. Small amount of gas within small bowel loops. No small or large bowel distention. No radiopaque stones. Visualized bones appear intact. IMPRESSION: Nonobstructive bowel gas pattern. Electronically Signed   By: Burman Nieves M.D.   On: 05/21/2016 01:19   Ct Head Wo Contrast  Result Date: 05/20/2016 CLINICAL DATA:  Increasing confusion.  No reported injury. EXAM: CT HEAD WITHOUT CONTRAST TECHNIQUE: Contiguous axial images were obtained from the base of the skull through the vertex without intravenous contrast. COMPARISON:  10/21/2015 head CT. FINDINGS: Brain: No evidence of parenchymal hemorrhage or extra-axial fluid collection. No mass lesion, mass effect, or midline shift. No CT evidence of acute infarction. Generalized cerebral volume loss. Nonspecific mild-to-moderate subcortical and periventricular white matter hypodensity, most in keeping with chronic small vessel ischemic change. Cerebral ventricle sizes are stable and concordant with the degree of cerebral volume loss. Vascular: No hyperdense vessel or unexpected calcification. Skull: No evidence of calvarial fracture. Sinuses/Orbits: The visualized paranasal sinuses are essentially clear. Other: The mastoid air cells are unopacified. Partially visualized surgical hardware from ACDF in the lower cervical spine on the lateral scout view. IMPRESSION: 1.  No evidence of acute intracranial abnormality. 2. Generalized cerebral volume loss and mild-to-moderate chronic small vessel ischemia. Electronically Signed   By: Delbert Phenix M.D.   On: 05/20/2016 12:44   Dg C-arm 1-60 Min  Result Date: 04/27/2016 CLINICAL DATA:  Anterior cervical spinal fusion at C5-C7. EXAM: CERVICAL SPINE - 2-3 VIEW COMPARISON:  CTA of the neck performed 02/03/2016 FINDINGS: Anterior cervical spinal fusion hardware is noted at C5-C7, on two provided C-arm images from the OR. Mild underlying degenerative change  is noted along the cervical spine. IMPRESSION: Status post anterior cervical spinal fusion at C5-C7. Electronically Signed   By: Roanna Raider M.D.   On: 04/27/2016 19:20   Xr Cervical Spine 2 Or 3 Views  Result Date: 05/06/2016 Two-view x-ray cervical spine obtained and reviewed. This shows good position alignment 2 level cervical fusion Impression: Satisfactory postop cervical fusion C5-6 C6-7   Microbiology: Recent Results (from the past 240 hour(s))  Urine culture     Status: Abnormal  Collection Time: 05/20/16 11:38 AM  Result Value Ref Range Status   Specimen Description URINE, RANDOM  Final   Special Requests NONE  Final   Culture 80,000 COLONIES/mL YEAST (A)  Final   Report Status 05/21/2016 FINAL  Final     Labs: Basic Metabolic Panel:  Recent Labs Lab 05/20/16 1233 05/20/16 1959 05/21/16 0533 05/22/16 0847  NA 137  --  138 135  K 3.3*  --  2.7* 4.1  CL 98*  --  100* 106  CO2 31  --  27 22  GLUCOSE 119*  --  72 51*  BUN 8  --  6 5*  CREATININE 1.04* 0.93 0.85 0.71  CALCIUM 10.2  --  9.7 9.1   Liver Function Tests:  Recent Labs Lab 05/20/16 1233 05/21/16 0533  AST 27 27  ALT 18 15  ALKPHOS 119 115  BILITOT 0.8 0.9  PROT 7.0 6.6  ALBUMIN 4.1 3.8   No results for input(s): LIPASE, AMYLASE in the last 168 hours. No results for input(s): AMMONIA in the last 168 hours. CBC:  Recent Labs Lab 05/20/16 1138 05/20/16 1959 05/21/16 0533  WBC 7.0 8.9 7.1  NEUTROABS 4.3  --  3.7  HGB 14.3 14.7 13.9  HCT 42.0 42.3 40.3  MCV 80.9 80.4 79.3  PLT 166 220 224   Cardiac Enzymes: No results for input(s): CKTOTAL, CKMB, CKMBINDEX, TROPONINI in the last 168 hours. BNP: BNP (last 3 results) No results for input(s): BNP in the last 8760 hours.  ProBNP (last 3 results) No results for input(s): PROBNP in the last 8760 hours.  CBG:  Recent Labs Lab 05/21/16 1938 05/21/16 2146 05/22/16 0755 05/22/16 0904 05/22/16 1034  GLUCAP 125* 109* 57* 66 81        Signed:  Rhetta Mura MD   [facility] 05/22/2016, 11:09 AM

## 2016-05-22 NOTE — Care Management Note (Signed)
Case Management Note  Patient Details  Name: [name] GlazeLinda J [name] MRN: [id number] Date of Birth: 02-10-1946  Subjective/Objective:     Toxic Metabolic Encephalopathy                Action/Plan: Discharge Planning: AVS reviewed: NCM spoke to pt and husband at bedside. Pt requesting wheelchair for home. Contacted [facility] DME rep for wheelchair for home.    PCP Georgianne FickAMACHANDRAN, AJITH MD  Expected Discharge Date:  05/22/16               Expected Discharge Plan:  Home/Self Care  In-House Referral:  NA  Discharge planning Services  CM Consult  Post Acute Care Choice:  NA Choice offered to:  NA  DME Arranged:  Lightweight manual wheelchair with seat cushion DME Agency:  [facility]  HH Arranged:  NA HH Agency:  NA  Status of Service:  Completed, signed off  If discussed at Long Length of Stay Meetings, dates discussed:    Additional Comments:  Elliot CousinShavis, Christophor Eick Ellen, RN 05/22/2016, 12:40 PM

## 2016-05-27 ENCOUNTER — Telehealth (INDEPENDENT_AMBULATORY_CARE_PROVIDER_SITE_OTHER): Payer: Self-pay | Admitting: Orthopaedic Surgery

## 2016-05-27 NOTE — Telephone Encounter (Signed)
Please advise 

## 2016-05-27 NOTE — Telephone Encounter (Signed)
Patient's husband Janyth Pupa(Nicholas) called asked if he can get a nurse to come to his home to assist him with Bonita QuinLinda. He advised Bonita QuinLinda is not taking her medication correctly. Janyth Pupaicholas said Bonita QuinLinda is forgetting how and when to take her medication. He advised they are currently using [facility].  The number to contact him is [phone]

## 2016-05-27 NOTE — Telephone Encounter (Signed)
[facility]

## 2016-05-27 NOTE — Telephone Encounter (Signed)
[facility] send nurse out one time to go over all her meds write them on a paper so husband can follow it. I told her to stop her pain meds then she can remember what she is suppose to do.  Thanks

## 2016-05-30 ENCOUNTER — Telehealth (INDEPENDENT_AMBULATORY_CARE_PROVIDER_SITE_OTHER): Payer: Self-pay | Admitting: Orthopaedic Surgery

## 2016-05-30 NOTE — Telephone Encounter (Signed)
[name] called from [facility] having a few questions about the PT orders? She said a 1 time nurse isn't allowed unless having Physical Therapy. CB # [phone]

## 2016-05-30 NOTE — Telephone Encounter (Signed)
I called and spoke with Lorie. Patient originally was put in for HHPT after surgery and refused it. [facility] is not working with the patient. They are unable to go in and do medication management for a one time visit. The patient needs to be under their care for PT or OT. Please advise.

## 2016-05-31 NOTE — Telephone Encounter (Signed)
Call pt's husband and let him know, they will have to go to her medical doc and go over all meds to get straight.

## 2016-05-31 NOTE — Telephone Encounter (Signed)
I called patient's husband and advised. He is going to call Dr. Nicholos Johnsamachandran. He states that he believes his wife has an appt there next week.

## 2016-06-08 ENCOUNTER — Ambulatory Visit (INDEPENDENT_AMBULATORY_CARE_PROVIDER_SITE_OTHER): Payer: Medicare Other | Admitting: Orthopaedic Surgery

## 2016-06-09 DIAGNOSIS — R634 Abnormal weight loss: Secondary | ICD-10-CM | POA: Diagnosis not present

## 2016-06-09 DIAGNOSIS — I10 Essential (primary) hypertension: Secondary | ICD-10-CM | POA: Diagnosis not present

## 2016-06-09 DIAGNOSIS — I2510 Atherosclerotic heart disease of native coronary artery without angina pectoris: Secondary | ICD-10-CM | POA: Diagnosis not present

## 2016-06-09 DIAGNOSIS — E1165 Type 2 diabetes mellitus with hyperglycemia: Secondary | ICD-10-CM | POA: Diagnosis not present

## 2016-06-09 DIAGNOSIS — Z0000 Encounter for general adult medical examination without abnormal findings: Secondary | ICD-10-CM | POA: Diagnosis not present

## 2016-06-09 DIAGNOSIS — E7800 Pure hypercholesterolemia, unspecified: Secondary | ICD-10-CM | POA: Diagnosis not present

## 2016-06-13 DIAGNOSIS — E1165 Type 2 diabetes mellitus with hyperglycemia: Secondary | ICD-10-CM | POA: Diagnosis not present

## 2016-06-16 ENCOUNTER — Encounter: Payer: Self-pay | Admitting: Cardiology

## 2016-06-21 DIAGNOSIS — E7800 Pure hypercholesterolemia, unspecified: Secondary | ICD-10-CM | POA: Diagnosis not present

## 2016-06-21 DIAGNOSIS — I2510 Atherosclerotic heart disease of native coronary artery without angina pectoris: Secondary | ICD-10-CM | POA: Diagnosis not present

## 2016-06-21 DIAGNOSIS — I129 Hypertensive chronic kidney disease with stage 1 through stage 4 chronic kidney disease, or unspecified chronic kidney disease: Secondary | ICD-10-CM | POA: Diagnosis not present

## 2016-06-21 DIAGNOSIS — E1165 Type 2 diabetes mellitus with hyperglycemia: Secondary | ICD-10-CM | POA: Diagnosis not present

## 2016-07-21 NOTE — Progress Notes (Signed)
, °

## 2016-08-15 ENCOUNTER — Encounter: Payer: Medicare Other | Admitting: *Deleted

## 2016-08-19 ENCOUNTER — Encounter: Payer: Self-pay | Admitting: Cardiology

## 2016-08-23 ENCOUNTER — Telehealth: Payer: Self-pay | Admitting: *Deleted

## 2016-08-23 NOTE — Telephone Encounter (Signed)
New message     1. Has your device fired? nmo  2. Is you device beeping? Pt states that the device is not sending the transmission like it normally does  3. Are you experiencing draining or swelling at device site? no  4. Are you calling to see if we received your device transmission? no  5. Have you passed out? no

## 2016-08-23 NOTE — Telephone Encounter (Signed)
Spoke w/ pt and she informed me that she can not get the monitor to turn on even after changing batteries. Informed pt that I would order a new monitor and she should have it in 7-10 business days. Pt verbalized understanding and said she would send the remote transmission once she receives the new monitor.

## 2016-08-31 ENCOUNTER — Telehealth: Payer: Self-pay | Admitting: Interventional Cardiology

## 2016-08-31 DIAGNOSIS — M79604 Pain in right leg: Secondary | ICD-10-CM

## 2016-08-31 DIAGNOSIS — M7989 Other specified soft tissue disorders: Principal | ICD-10-CM

## 2016-08-31 NOTE — Telephone Encounter (Signed)
Needs lower extremith venous doppler/duplex DVT study. Also needs to see PCP to consider cellulitis.

## 2016-08-31 NOTE — Telephone Encounter (Signed)
Close this incomplete encounter

## 2016-08-31 NOTE — Telephone Encounter (Signed)
Patient complaining of pain and swelling in her right leg. Patient states that her right leg is broken out, has raw places, is red and inflamed, and is itching and burning. Patient states that it feels like a bee sting, but she states that she has not been bitten by anything. Patient states that she is a little SOB all of the time. She states that all of this has been going on for a couple of months. Patient advised to follow up with PCP. Patient states that she thinks it is something else and that she wants to have a doppler done. Routed to Dr. Michaelle CopasSmith's nurse.

## 2016-08-31 NOTE — Telephone Encounter (Signed)
Will route to Dr. Katrinka BlazingSmith to see if he feels doppler needs to be done?

## 2016-09-01 NOTE — Telephone Encounter (Signed)
Spoke with pt and made her aware that I will order doppler and have scheduler for that dept call her.  Advised she also needed to contact PCP in the event this is cellulitis.  Pt states this is definitely circulation problem and feels Dr. Katrinka BlazingSmith needs to address it.  Advised pt we are ordering doppler but she still needs to go ahead and contact PCP for an appt so they can take it look at it in the event it is not a DVT.  Pt verbalized understanding.  Pt would like scheduler to call her cell number at [phone] and asked that message be left and she will call back because she will be at doctor with husband today.

## 2016-09-05 ENCOUNTER — Ambulatory Visit (HOSPITAL_COMMUNITY)
Admission: RE | Admit: 2016-09-05 | Discharge: 2016-09-05 | Disposition: A | Discharge status: Home / Self Care | Payer: Medicare Other | Source: Ambulatory Visit | Attending: Cardiology | Admitting: Cardiology

## 2016-09-05 DIAGNOSIS — M79604 Pain in right leg: Secondary | ICD-10-CM | POA: Diagnosis not present

## 2016-09-05 DIAGNOSIS — Z8673 Personal history of transient ischemic attack (TIA), and cerebral infarction without residual deficits: Secondary | ICD-10-CM | POA: Diagnosis not present

## 2016-09-05 DIAGNOSIS — M7989 Other specified soft tissue disorders: Secondary | ICD-10-CM | POA: Insufficient documentation

## 2016-09-05 DIAGNOSIS — R590 Localized enlarged lymph nodes: Secondary | ICD-10-CM | POA: Insufficient documentation

## 2016-09-05 DIAGNOSIS — E119 Type 2 diabetes mellitus without complications: Secondary | ICD-10-CM | POA: Diagnosis not present

## 2016-09-05 DIAGNOSIS — I10 Essential (primary) hypertension: Secondary | ICD-10-CM | POA: Insufficient documentation

## 2016-09-05 DIAGNOSIS — Z87891 Personal history of nicotine dependence: Secondary | ICD-10-CM | POA: Diagnosis not present

## 2016-09-14 DIAGNOSIS — F419 Anxiety disorder, unspecified: Secondary | ICD-10-CM | POA: Diagnosis not present

## 2016-09-14 DIAGNOSIS — L282 Other prurigo: Secondary | ICD-10-CM | POA: Diagnosis not present

## 2016-09-26 ENCOUNTER — Telehealth: Payer: Self-pay | Admitting: Internal Medicine

## 2016-09-26 NOTE — Telephone Encounter (Signed)
New Message  Pt call requesting to speak with RN about getting help to set up her new monitor and send transmission. Please call back to discuss

## 2016-09-29 ENCOUNTER — Ambulatory Visit (INDEPENDENT_AMBULATORY_CARE_PROVIDER_SITE_OTHER): Payer: Medicare Other | Admitting: *Deleted

## 2016-09-29 ENCOUNTER — Ambulatory Visit (INDEPENDENT_AMBULATORY_CARE_PROVIDER_SITE_OTHER): Payer: Self-pay | Admitting: *Deleted

## 2016-09-29 DIAGNOSIS — I442 Atrioventricular block, complete: Secondary | ICD-10-CM | POA: Diagnosis not present

## 2016-09-29 NOTE — Progress Notes (Signed)
Successfully paired patient's home monitor. Education print out given, patient and husband walked through how to send a manual transmission.

## 2016-09-30 ENCOUNTER — Encounter: Payer: Self-pay | Admitting: Cardiology

## 2016-09-30 NOTE — Progress Notes (Signed)
Remote pacemaker transmission.   

## 2016-10-06 DIAGNOSIS — L309 Dermatitis, unspecified: Secondary | ICD-10-CM | POA: Diagnosis not present

## 2016-10-11 DIAGNOSIS — E1165 Type 2 diabetes mellitus with hyperglycemia: Secondary | ICD-10-CM | POA: Diagnosis not present

## 2016-10-11 DIAGNOSIS — I10 Essential (primary) hypertension: Secondary | ICD-10-CM | POA: Diagnosis not present

## 2016-10-11 DIAGNOSIS — E7800 Pure hypercholesterolemia, unspecified: Secondary | ICD-10-CM | POA: Diagnosis not present

## 2016-10-11 DIAGNOSIS — R21 Rash and other nonspecific skin eruption: Secondary | ICD-10-CM | POA: Diagnosis not present

## 2016-10-12 DIAGNOSIS — I129 Hypertensive chronic kidney disease with stage 1 through stage 4 chronic kidney disease, or unspecified chronic kidney disease: Secondary | ICD-10-CM | POA: Diagnosis not present

## 2016-10-12 DIAGNOSIS — E7800 Pure hypercholesterolemia, unspecified: Secondary | ICD-10-CM | POA: Diagnosis not present

## 2016-10-12 DIAGNOSIS — I10 Essential (primary) hypertension: Secondary | ICD-10-CM | POA: Diagnosis not present

## 2016-10-12 DIAGNOSIS — E1165 Type 2 diabetes mellitus with hyperglycemia: Secondary | ICD-10-CM | POA: Diagnosis not present

## 2016-10-19 DIAGNOSIS — N3941 Urge incontinence: Secondary | ICD-10-CM | POA: Diagnosis not present

## 2016-10-19 DIAGNOSIS — E7800 Pure hypercholesterolemia, unspecified: Secondary | ICD-10-CM | POA: Diagnosis not present

## 2016-10-19 DIAGNOSIS — E1165 Type 2 diabetes mellitus with hyperglycemia: Secondary | ICD-10-CM | POA: Diagnosis not present

## 2016-10-19 DIAGNOSIS — I2510 Atherosclerotic heart disease of native coronary artery without angina pectoris: Secondary | ICD-10-CM | POA: Diagnosis not present

## 2016-10-24 DIAGNOSIS — M3500 Sicca syndrome, unspecified: Secondary | ICD-10-CM | POA: Diagnosis not present

## 2016-10-24 DIAGNOSIS — M797 Fibromyalgia: Secondary | ICD-10-CM | POA: Diagnosis not present

## 2016-10-24 DIAGNOSIS — M542 Cervicalgia: Secondary | ICD-10-CM | POA: Diagnosis not present

## 2016-10-24 DIAGNOSIS — M2550 Pain in unspecified joint: Secondary | ICD-10-CM | POA: Diagnosis not present

## 2016-10-31 LAB — CUP PACEART REMOTE DEVICE CHECK
Battery Impedance: 1755 Ohm
Battery Remaining Longevity: 27 mo
Battery Voltage: 2.71 V
Brady Statistic AP VP Percent: 40 %
Brady Statistic AP VS Percent: 0 %
Brady Statistic AS VP Percent: 60 %
Implantable Lead Implant Date: 20081113
Implantable Lead Location: 753860
Implantable Lead Model: 5076
Implantable Pulse Generator Implant Date: 20081113
Lead Channel Impedance Value: 541 Ohm
Lead Channel Pacing Threshold Amplitude: 0.625 V
Lead Channel Pacing Threshold Pulse Width: 0.4 ms
Lead Channel Pacing Threshold Pulse Width: 0.4 ms
Lead Channel Setting Pacing Amplitude: 2 V
Lead Channel Setting Pacing Amplitude: 2.5 V
Lead Channel Setting Pacing Pulse Width: 0.4 ms
MDC IDC LEAD IMPLANT DT: 20081113
MDC IDC LEAD LOCATION: 753859
MDC IDC MSMT LEADCHNL RV IMPEDANCE VALUE: 709 Ohm
MDC IDC MSMT LEADCHNL RV PACING THRESHOLD AMPLITUDE: 0.625 V
MDC IDC SESS DTM: 20180726132020
MDC IDC SET LEADCHNL RV SENSING SENSITIVITY: 4 mV
MDC IDC STAT BRADY AS VS PERCENT: 0 %

## 2016-11-08 DIAGNOSIS — L309 Dermatitis, unspecified: Secondary | ICD-10-CM | POA: Diagnosis not present

## 2016-11-08 DIAGNOSIS — L989 Disorder of the skin and subcutaneous tissue, unspecified: Secondary | ICD-10-CM | POA: Diagnosis not present

## 2016-12-29 ENCOUNTER — Telehealth: Payer: Self-pay | Admitting: Cardiology

## 2016-12-29 ENCOUNTER — Ambulatory Visit (INDEPENDENT_AMBULATORY_CARE_PROVIDER_SITE_OTHER): Payer: Medicare Other | Admitting: *Deleted

## 2016-12-29 DIAGNOSIS — I442 Atrioventricular block, complete: Secondary | ICD-10-CM | POA: Diagnosis not present

## 2016-12-29 NOTE — Progress Notes (Signed)
Remote pacemaker transmission.   

## 2016-12-29 NOTE — Telephone Encounter (Signed)
LMOVM reminding pt to send remote transmission.   

## 2016-12-30 ENCOUNTER — Encounter: Payer: Self-pay | Admitting: Cardiology

## 2016-12-30 DIAGNOSIS — G8929 Other chronic pain: Secondary | ICD-10-CM | POA: Diagnosis not present

## 2016-12-30 DIAGNOSIS — I129 Hypertensive chronic kidney disease with stage 1 through stage 4 chronic kidney disease, or unspecified chronic kidney disease: Secondary | ICD-10-CM | POA: Diagnosis not present

## 2016-12-30 DIAGNOSIS — Z23 Encounter for immunization: Secondary | ICD-10-CM | POA: Diagnosis not present

## 2016-12-30 DIAGNOSIS — M5441 Lumbago with sciatica, right side: Secondary | ICD-10-CM | POA: Diagnosis not present

## 2016-12-30 DIAGNOSIS — I10 Essential (primary) hypertension: Secondary | ICD-10-CM | POA: Diagnosis not present

## 2016-12-30 DIAGNOSIS — E7800 Pure hypercholesterolemia, unspecified: Secondary | ICD-10-CM | POA: Diagnosis not present

## 2016-12-30 DIAGNOSIS — M25561 Pain in right knee: Secondary | ICD-10-CM | POA: Diagnosis not present

## 2016-12-30 DIAGNOSIS — I2510 Atherosclerotic heart disease of native coronary artery without angina pectoris: Secondary | ICD-10-CM | POA: Diagnosis not present

## 2016-12-30 DIAGNOSIS — N3941 Urge incontinence: Secondary | ICD-10-CM | POA: Diagnosis not present

## 2017-01-10 LAB — CUP PACEART REMOTE DEVICE CHECK
Battery Impedance: 1960 Ohm
Battery Voltage: 2.71 V
Brady Statistic AP VS Percent: 0 %
Date Time Interrogation Session: 20181025162245
Implantable Lead Implant Date: 20081113
Implantable Lead Implant Date: 20081113
Implantable Lead Location: 753860
Implantable Lead Model: 5076
Implantable Lead Model: 5076
Implantable Pulse Generator Implant Date: 20081113
Lead Channel Pacing Threshold Pulse Width: 0.4 ms
Lead Channel Setting Pacing Amplitude: 2.5 V
Lead Channel Setting Pacing Pulse Width: 0.4 ms
MDC IDC LEAD LOCATION: 753859
MDC IDC MSMT BATTERY REMAINING LONGEVITY: 24 mo
MDC IDC MSMT LEADCHNL RA IMPEDANCE VALUE: 582 Ohm
MDC IDC MSMT LEADCHNL RA PACING THRESHOLD AMPLITUDE: 0.5 V
MDC IDC MSMT LEADCHNL RA PACING THRESHOLD PULSEWIDTH: 0.4 ms
MDC IDC MSMT LEADCHNL RV IMPEDANCE VALUE: 726 Ohm
MDC IDC MSMT LEADCHNL RV PACING THRESHOLD AMPLITUDE: 0.5 V
MDC IDC SET LEADCHNL RA PACING AMPLITUDE: 2 V
MDC IDC SET LEADCHNL RV SENSING SENSITIVITY: 4 mV
MDC IDC STAT BRADY AP VP PERCENT: 47 %
MDC IDC STAT BRADY AS VP PERCENT: 53 %
MDC IDC STAT BRADY AS VS PERCENT: 0 %

## 2017-01-24 DIAGNOSIS — I4891 Unspecified atrial fibrillation: Secondary | ICD-10-CM | POA: Insufficient documentation

## 2017-01-24 DIAGNOSIS — I2699 Other pulmonary embolism without acute cor pulmonale: Secondary | ICD-10-CM | POA: Insufficient documentation

## 2017-01-24 DIAGNOSIS — R21 Rash and other nonspecific skin eruption: Secondary | ICD-10-CM | POA: Insufficient documentation

## 2017-01-24 DIAGNOSIS — Z96659 Presence of unspecified artificial knee joint: Secondary | ICD-10-CM | POA: Insufficient documentation

## 2017-01-24 DIAGNOSIS — R011 Cardiac murmur, unspecified: Secondary | ICD-10-CM | POA: Insufficient documentation

## 2017-01-24 DIAGNOSIS — Z8719 Personal history of other diseases of the digestive system: Secondary | ICD-10-CM | POA: Insufficient documentation

## 2017-01-24 DIAGNOSIS — M1990 Unspecified osteoarthritis, unspecified site: Secondary | ICD-10-CM | POA: Insufficient documentation

## 2017-01-24 DIAGNOSIS — Z950 Presence of cardiac pacemaker: Secondary | ICD-10-CM | POA: Insufficient documentation

## 2017-01-24 DIAGNOSIS — R42 Dizziness and giddiness: Secondary | ICD-10-CM | POA: Insufficient documentation

## 2017-01-24 DIAGNOSIS — I447 Left bundle-branch block, unspecified: Secondary | ICD-10-CM | POA: Insufficient documentation

## 2017-01-24 DIAGNOSIS — G4733 Obstructive sleep apnea (adult) (pediatric): Secondary | ICD-10-CM | POA: Insufficient documentation

## 2017-01-24 DIAGNOSIS — I00 Rheumatic fever without heart involvement: Secondary | ICD-10-CM | POA: Insufficient documentation

## 2017-01-30 DIAGNOSIS — M1711 Unilateral primary osteoarthritis, right knee: Secondary | ICD-10-CM | POA: Diagnosis not present

## 2017-01-30 DIAGNOSIS — Z96651 Presence of right artificial knee joint: Secondary | ICD-10-CM | POA: Diagnosis not present

## 2017-01-30 DIAGNOSIS — G894 Chronic pain syndrome: Secondary | ICD-10-CM | POA: Diagnosis not present

## 2017-01-30 DIAGNOSIS — M545 Low back pain: Secondary | ICD-10-CM | POA: Diagnosis not present

## 2017-02-03 ENCOUNTER — Other Ambulatory Visit: Payer: Self-pay

## 2017-02-03 DIAGNOSIS — I6529 Occlusion and stenosis of unspecified carotid artery: Secondary | ICD-10-CM

## 2017-02-06 ENCOUNTER — Ambulatory Visit (INDEPENDENT_AMBULATORY_CARE_PROVIDER_SITE_OTHER): Payer: Medicare Other | Admitting: Internal Medicine

## 2017-02-06 ENCOUNTER — Encounter: Payer: Self-pay | Admitting: Internal Medicine

## 2017-02-06 ENCOUNTER — Encounter: Payer: Self-pay | Admitting: *Deleted

## 2017-02-06 VITALS — BP 140/72 | HR 68 | Ht 63.0 in | Wt 145.0 lb

## 2017-02-06 DIAGNOSIS — I442 Atrioventricular block, complete: Secondary | ICD-10-CM | POA: Diagnosis not present

## 2017-02-06 DIAGNOSIS — Z01812 Encounter for preprocedural laboratory examination: Secondary | ICD-10-CM | POA: Diagnosis not present

## 2017-02-06 DIAGNOSIS — I2510 Atherosclerotic heart disease of native coronary artery without angina pectoris: Secondary | ICD-10-CM

## 2017-02-06 DIAGNOSIS — I4891 Unspecified atrial fibrillation: Secondary | ICD-10-CM

## 2017-02-06 DIAGNOSIS — Z950 Presence of cardiac pacemaker: Secondary | ICD-10-CM | POA: Diagnosis not present

## 2017-02-06 DIAGNOSIS — I6523 Occlusion and stenosis of bilateral carotid arteries: Secondary | ICD-10-CM

## 2017-02-06 NOTE — H&P (View-Only) (Signed)
Patient Care Team: Ramachandran, Ajith, MD as PCP - General (Internal Medicine) Lyn RecordsSmith, Henry W, Georgianne FickMD as Consulting Physician (Cardiology) Waymon BudgeYoung, Clinton D, MD as Consulting Physician (Pulmonary Disease) Duke SalviaKlein, Clemence Stillings C, MD as Consulting Physician (Cardiology)   HPI  Stacie GlazeLinda J [name] is a 71 y.o. female Seen in followup. The device was implanted for sinus node dysfunction November 2008 it was associated with a modest improvement in activity.  She is a history of coronary artery disease with moderate-mild diffuse disease of her LAD; this was identified a catheterization 2005. Myoview scan 2008 demonstrated no ischemia.  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 She has undergone physical therapy  which helped some but she is not able to get there easily because of her knees which makes driving difficult.    She comes in with complaints of increasing shortness of breath over the last 3-4 months.  This is accompanied by chest discomfort which she describes as crushing.  There is been accompanying nausea.  There is pain in her neck with walking.  But she is not sure whether it is associated with chest discomfort.  It persists post stopping.  She is also had some peripheral edema but no orthopnea.  She does not recall what prompted her catheterization in 2005.  Date Cr K  3/18 0.71 4.1  12/18 1.02 4.6     Past Medical History:  Diagnosis Date  . Allergic rhinitis, cause unspecified   . Anxiety state, unspecified   . Arthritis   . Atrial fibrillation (HCC)   . Bronchitis, not specified as acute or chronic   . Cardiac pacemaker medtronic   . 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 Depressive disorder, not elsewhere classified   . Disorders of sacrum   . GERD  (gastroesophageal reflux disease)   . Headache(784.0)    history of  . Heart murmur   . History of hiatal hernia   . Hypertension   . LBBB (left bundle branch block)   . Myalgia and myositis, unspecified   . Obstructive sleep apnea (adult) (pediatric)   . Other voice and resonance disorders   . Pain in joint, lower leg    bilateral  . Presence of permanent cardiac pacemaker   . Pulmonary embolism (HCC)   . Rash, skin 2 weeks per patient   right arm, inner aspect    . Rheumatic fever in pediatric patient   . Total knee replacement status   . Type II or unspecified type diabetes mellitus without mention of complication, not stated as uncontrolled    Type II  . Unspecified asthma(493.90)   . 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 Vertigo    Meclizine (Antivert)    Past Surgical History:  Procedure Laterality Date  . ABDOMINAL HYSTERECTOMY    . ANTERIOR CERVICAL DECOMP/DISCECTOMY FUSION N/A 04/27/2016   Procedure: C5-6, C6-7 Anterior Cervical Discectomy and Fusion, Allograft, Plate;  Surgeon: Eldred MangesMark C Yates, MD;  Location: [facility];  Service: Orthopedics;  Laterality: N/A;  . BALLOON DILATION N/A 06/26/2013   Procedure: Marvis RepressBALLOON DILATION;  Surgeon: Vertell NovakJames L Edwards Jr., MD;  Location: [facility];  Service: Endoscopy;  Laterality: N/A;  . BILATERAL SALPINGOOPHORECTOMY  1991  . CHOLECYSTECTOMY    .  COLONOSCOPY N/A 06/26/2013   Procedure: COLONOSCOPY;  Surgeon: Vertell Novak., MD;  Location: [facility];  Service: Endoscopy;  Laterality: N/A;  . ELBOW SURGERY Bilateral   . ENDARTERECTOMY Left 10/31/2014   Procedure: LEFT CAROTID ENDARTERECTOMY WITH BOVINE PERICARDIAL PATCH ANGIOPLASTY;  Surgeon: Nada Libman, MD;  Location: [facility];  Service: Vascular;  Laterality: Left;  . ESOPHAGOGASTRODUODENOSCOPY  11/02/2011   Procedure: ESOPHAGOGASTRODUODENOSCOPY (EGD);  Surgeon: Vertell Novak., MD;  Location: [facility];  Service:  Endoscopy;  Laterality: N/A;  with xray  . ESOPHAGOGASTRODUODENOSCOPY N/A 06/26/2013   Procedure: ESOPHAGOGASTRODUODENOSCOPY (EGD);  Surgeon: Vertell Novak., MD;  Location: [facility];  Service: Endoscopy;  Laterality: N/A;  . INSERT / REPLACE / REMOVE PACEMAKER     insertion 2007  . NASAL FRACTURE SURGERY    . SAVORY DILATION  11/02/2011   Procedure: SAVORY DILATION;  Surgeon: Vertell Novak., MD;  Location: [facility];  Service: Endoscopy;  Laterality: N/A;  . TEMPOROMANDIBULAR JOINT ARTHROPLASTY  1982  . TOTAL KNEE ARTHROPLASTY  2001, 2010, 2012   right  . total knee repair  2010   right  . VESICOVAGINAL FISTULA CLOSURE W/ TAH  1974    Current Outpatient Medications  Medication Sig Dispense Refill  . aspirin EC 81 MG tablet Take 81 mg by mouth daily.    Marland Kitchen atorvastatin (LIPITOR) 20 MG tablet Take 20 mg by mouth daily.      . BD PEN NEEDLE NANO U/F 32G X 4 MM MISC Use as directed for Insulin Injections    . cetirizine (ZYRTEC) 10 MG tablet Take 10 mg by mouth daily.    . cycloSPORINE (RESTASIS) 0.05 % ophthalmic emulsion Place 1 drop into both eyes 2 (two) times daily.    Marland Kitchen diltiazem (CARDIZEM CD) 120 MG 24 hr capsule Take 1 capsule (120 mg total) by mouth daily. 90 capsule 4  . DULoxetine (CYMBALTA) 30 MG capsule Take 90 mg by mouth daily.     Marland Kitchen EPINEPHrine 0.3 mg/0.3 mL IJ SOAJ injection Inject 0.3 mg into the muscle as needed (FOR ALLERGIES).    Marland Kitchen HUMULIN N KWIKPEN 100 UNIT/ML Kiwkpen Inject 10 Units into the skin 2 (two) times daily. (Patient taking differently: Inject 20 units in the mornings and 40 units at bedtime into skin daily) 15 mL 11  . hydrochlorothiazide (HYDRODIURIL) 25 MG tablet Take 0.5 tablets (12.5 mg total) by mouth daily. 45 tablet 11  . HYDROcodone-acetaminophen (NORCO) 5-325 MG tablet Take 1-2 tablets by mouth every 6 (six) hours as needed for moderate pain. 40 tablet 0  . MYRBETRIQ 50 MG TB24 tablet Take 50 mg by mouth daily.  3  . nebivolol  (BYSTOLIC) 10 MG tablet TAKE 1 TABLET BY MOUTH TWICE A DAY. (Patient taking differently: Take 10 mg by mouth 2 (two) times daily. ) 180 tablet 3  . nitrofurantoin, macrocrystal-monohydrate, (MACROBID) 100 MG capsule Take 1 capsule (100 mg total) by mouth 2 (two) times daily. 10 capsule 0  . Omega-3 Fatty Acids (FISH OIL) 1200 MG CAPS Take 2,400 mg by mouth daily.     Letta Pate VERIO test strip 1 each by Other route 2 (two) times daily.     . polyethylene glycol powder (GLYCOLAX/MIRALAX) powder Take 17 g by mouth daily as needed. For constipation.     No current facility-administered medications for this visit.     Allergies  Allergen Reactions  . Cephalexin Swelling    Tongue and lip swelling  .  Bactrim [Sulfamethoxazole-Trimethoprim] Nausea And Vomiting  . Penicillins Nausea And Vomiting and Swelling    Oral Cephalosporins cause Tongue and Lip Swelling  . Atenolol     Light headed/hair loss  . Chromic Sulfate Other (See Comments)    Chromic sutures--arm swells  . Codeine Nausea Only  . Contrast Media [Iodinated Diagnostic Agents] Other (See Comments)    Lips felt like on fire, rash, itching, lip peeling  . Desmopressin Other (See Comments)    Patient is unsure of reaction  . Dexilant [Dexlansoprazole] Diarrhea  . Doxycycline Nausea And Vomiting  . Entex     REACTION: unknown  . Marcelline DeistFarxiga [Dapagliflozin] Other (See Comments)    Patient is unsure of reaction  . Gabapentin     REACTION: unknown  . Levaquin [Levofloxacin] Other (See Comments)    GI upset  . Lexapro [Escitalopram] Other (See Comments)    Weight gain  . Lisinopril Other (See Comments)    Unsure of reaction.  . Losartan Potassium Other (See Comments)  . Metformin     REACTION: unknown  . Other     Bellaryl  . Pregabalin Other (See Comments)    REACTION: unknown  . Rosiglitazone Maleate Other (See Comments)    REACTION: unknown  . Simvastatin Other (See Comments)    Muscle aches.  . Sitagliptin Other (See  Comments)    Muscle ache  . Tradjenta [Linagliptin] Other (See Comments)    Joint pain    Review of Systems negative except from HPI and PMH  Physical Exam BP 140/72   Pulse 68   Ht 5\' 3"  (1.6 m)   Wt 145 lb (65.8 kg)   BMI 25.69 kg/m  Well developed and nourished in no acute distress HENT normal Neck supple with JVP-flat Carotids brisk and full without bruits Clear Device pocket well healed; without hematoma or erythema.  There is no tethering  Regular rate and rhythm, no murmurs or gallops PMI not displaced  Abd-soft with active BS without hepatomegaly No Clubbing cyanosis edema Skin-warm and dry A & Oriented  Grossly normal sensory and motor function  ECG demonstrates AV pacing  Assessment and  Plan  Sinus node dysfunction    Pacemaker-Medtronic    Hypertension   Dyspnea on exertion  Exertional presyncope  CAD  Hx of PE     Differential diagnosis of her dyspnea is broad.  Interestingly, her neck veins are flat making recurrent pulmonary emboli unlikely, she has no nocturnal dyspnea suggesting that her LV function is probably pretty good as supported by her PMI to be nondisplaced.  With her vascular disease, this makes ischemic disease near the top of the list.  I spoke with Dr. Katrinka BlazingSmith.  He thinks that noninvasive imaging is probably unlikely to be helpful.  He recommended proceeding directly with catheterization.  I have reviewed this with the patient.  Exertional lightheadedness is concerning.  It occurs in the context of orthostatic symptoms.  There is no objective evidence for this today.  Treadmill testing might be of value but this will be deferred until after we look at her coronary vascular bed.  Given the long-standing pacing, we will undertake an echocardiogram to look at LV function.  More than 50% of 40 min was spent in counseling related to the above

## 2017-02-06 NOTE — Progress Notes (Signed)
Patient Care Team: Ramachandran, Ajith, MD as PCP - General (Internal Medicine) Lyn RecordsSmith, Henry W, Georgianne FickMD as Consulting Physician (Cardiology) Waymon BudgeYoung, Clinton D, MD as Consulting Physician (Pulmonary Disease) Duke SalviaKlein, Leigh Kaeding C, MD as Consulting Physician (Cardiology)   HPI  [name] GlazeLinda J [name] is a 71 y.o. female Seen in followup. The device was implanted for sinus node dysfunction November 2008 it was associated with a modest improvement in activity.  She is a history of coronary artery disease with moderate-mild diffuse disease of her LAD; this was identified a catheterization 2005. Myoview scan 2008 demonstrated no ischemia.  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 She has undergone physical therapy  which helped some but she is not able to get there easily because of her knees which makes driving difficult.    She comes in with complaints of increasing shortness of breath over the last 3-4 months.  This is accompanied by chest discomfort which she describes as crushing.  There is been accompanying nausea.  There is pain in her neck with walking.  But she is not sure whether it is associated with chest discomfort.  It persists post stopping.  She is also had some peripheral edema but no orthopnea.  She does not recall what prompted her catheterization in 2005.  Date Cr K  3/18 0.71 4.1  12/18 1.02 4.6     Past Medical History:  Diagnosis Date  . Allergic rhinitis, cause unspecified   . Anxiety state, unspecified   . Arthritis   . Atrial fibrillation (HCC)   . Bronchitis, not specified as acute or chronic   . Cardiac pacemaker medtronic   . 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 Depressive disorder, not elsewhere classified   . Disorders of sacrum   . GERD  (gastroesophageal reflux disease)   . Headache(784.0)    history of  . Heart murmur   . History of hiatal hernia   . Hypertension   . LBBB (left bundle branch block)   . Myalgia and myositis, unspecified   . Obstructive sleep apnea (adult) (pediatric)   . Other voice and resonance disorders   . Pain in joint, lower leg    bilateral  . Presence of permanent cardiac pacemaker   . Pulmonary embolism (HCC)   . Rash, skin 2 weeks per patient   right arm, inner aspect    . Rheumatic fever in pediatric patient   . Total knee replacement status   . Type II or unspecified type diabetes mellitus without mention of complication, not stated as uncontrolled    Type II  . Unspecified asthma(493.90)   . 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 Vertigo    Meclizine (Antivert)    Past Surgical History:  Procedure Laterality Date  . ABDOMINAL HYSTERECTOMY    . ANTERIOR CERVICAL DECOMP/DISCECTOMY FUSION N/A 04/27/2016   Procedure: C5-6, C6-7 Anterior Cervical Discectomy and Fusion, Allograft, Plate;  Surgeon: Eldred MangesMark C Yates, MD;  Location: [facility];  Service: Orthopedics;  Laterality: N/A;  . BALLOON DILATION N/A 06/26/2013   Procedure: Marvis RepressBALLOON DILATION;  Surgeon: Vertell NovakJames L Edwards Jr., MD;  Location: [facility];  Service: Endoscopy;  Laterality: N/A;  . BILATERAL SALPINGOOPHORECTOMY  1991  . CHOLECYSTECTOMY    .  COLONOSCOPY N/A 06/26/2013   Procedure: COLONOSCOPY;  Surgeon: Vertell Novak., MD;  Location: [facility];  Service: Endoscopy;  Laterality: N/A;  . ELBOW SURGERY Bilateral   . ENDARTERECTOMY Left 10/31/2014   Procedure: LEFT CAROTID ENDARTERECTOMY WITH BOVINE PERICARDIAL PATCH ANGIOPLASTY;  Surgeon: Nada Libman, MD;  Location: [facility];  Service: Vascular;  Laterality: Left;  . ESOPHAGOGASTRODUODENOSCOPY  11/02/2011   Procedure: ESOPHAGOGASTRODUODENOSCOPY (EGD);  Surgeon: Vertell Novak., MD;  Location: [facility];  Service:  Endoscopy;  Laterality: N/A;  with xray  . ESOPHAGOGASTRODUODENOSCOPY N/A 06/26/2013   Procedure: ESOPHAGOGASTRODUODENOSCOPY (EGD);  Surgeon: Vertell Novak., MD;  Location: [facility];  Service: Endoscopy;  Laterality: N/A;  . INSERT / REPLACE / REMOVE PACEMAKER     insertion 2007  . NASAL FRACTURE SURGERY    . SAVORY DILATION  11/02/2011   Procedure: SAVORY DILATION;  Surgeon: Vertell Novak., MD;  Location: [facility];  Service: Endoscopy;  Laterality: N/A;  . TEMPOROMANDIBULAR JOINT ARTHROPLASTY  1982  . TOTAL KNEE ARTHROPLASTY  2001, 2010, 2012   right  . total knee repair  2010   right  . VESICOVAGINAL FISTULA CLOSURE W/ TAH  1974    Current Outpatient Medications  Medication Sig Dispense Refill  . aspirin EC 81 MG tablet Take 81 mg by mouth daily.    Marland Kitchen atorvastatin (LIPITOR) 20 MG tablet Take 20 mg by mouth daily.      . BD PEN NEEDLE NANO U/F 32G X 4 MM MISC Use as directed for Insulin Injections    . cetirizine (ZYRTEC) 10 MG tablet Take 10 mg by mouth daily.    . cycloSPORINE (RESTASIS) 0.05 % ophthalmic emulsion Place 1 drop into both eyes 2 (two) times daily.    Marland Kitchen diltiazem (CARDIZEM CD) 120 MG 24 hr capsule Take 1 capsule (120 mg total) by mouth daily. 90 capsule 4  . DULoxetine (CYMBALTA) 30 MG capsule Take 90 mg by mouth daily.     Marland Kitchen EPINEPHrine 0.3 mg/0.3 mL IJ SOAJ injection Inject 0.3 mg into the muscle as needed (FOR ALLERGIES).    Marland Kitchen HUMULIN N KWIKPEN 100 UNIT/ML Kiwkpen Inject 10 Units into the skin 2 (two) times daily. (Patient taking differently: Inject 20 units in the mornings and 40 units at bedtime into skin daily) 15 mL 11  . hydrochlorothiazide (HYDRODIURIL) 25 MG tablet Take 0.5 tablets (12.5 mg total) by mouth daily. 45 tablet 11  . HYDROcodone-acetaminophen (NORCO) 5-325 MG tablet Take 1-2 tablets by mouth every 6 (six) hours as needed for moderate pain. 40 tablet 0  . MYRBETRIQ 50 MG TB24 tablet Take 50 mg by mouth daily.  3  . nebivolol  (BYSTOLIC) 10 MG tablet TAKE 1 TABLET BY MOUTH TWICE A DAY. (Patient taking differently: Take 10 mg by mouth 2 (two) times daily. ) 180 tablet 3  . nitrofurantoin, macrocrystal-monohydrate, (MACROBID) 100 MG capsule Take 1 capsule (100 mg total) by mouth 2 (two) times daily. 10 capsule 0  . Omega-3 Fatty Acids (FISH OIL) 1200 MG CAPS Take 2,400 mg by mouth daily.     Letta Pate VERIO test strip 1 each by Other route 2 (two) times daily.     . polyethylene glycol powder (GLYCOLAX/MIRALAX) powder Take 17 g by mouth daily as needed. For constipation.     No current facility-administered medications for this visit.     Allergies  Allergen Reactions  . Cephalexin Swelling    Tongue and lip swelling  .  Bactrim [Sulfamethoxazole-Trimethoprim] Nausea And Vomiting  . Penicillins Nausea And Vomiting and Swelling    Oral Cephalosporins cause Tongue and Lip Swelling  . Atenolol     Light headed/hair loss  . Chromic Sulfate Other (See Comments)    Chromic sutures--arm swells  . Codeine Nausea Only  . Contrast Media [Iodinated Diagnostic Agents] Other (See Comments)    Lips felt like on fire, rash, itching, lip peeling  . Desmopressin Other (See Comments)    Patient is unsure of reaction  . Dexilant [Dexlansoprazole] Diarrhea  . Doxycycline Nausea And Vomiting  . Entex     REACTION: unknown  . Marcelline DeistFarxiga [Dapagliflozin] Other (See Comments)    Patient is unsure of reaction  . Gabapentin     REACTION: unknown  . Levaquin [Levofloxacin] Other (See Comments)    GI upset  . Lexapro [Escitalopram] Other (See Comments)    Weight gain  . Lisinopril Other (See Comments)    Unsure of reaction.  . Losartan Potassium Other (See Comments)  . Metformin     REACTION: unknown  . Other     Bellaryl  . Pregabalin Other (See Comments)    REACTION: unknown  . Rosiglitazone Maleate Other (See Comments)    REACTION: unknown  . Simvastatin Other (See Comments)    Muscle aches.  . Sitagliptin Other (See  Comments)    Muscle ache  . Tradjenta [Linagliptin] Other (See Comments)    Joint pain    Review of Systems negative except from HPI and PMH  Physical Exam BP 140/72   Pulse 68   Ht 5\' 3"  (1.6 m)   Wt 145 lb (65.8 kg)   BMI 25.69 kg/m  Well developed and nourished in no acute distress HENT normal Neck supple with JVP-flat Carotids brisk and full without bruits Clear Device pocket well healed; without hematoma or erythema.  There is no tethering  Regular rate and rhythm, no murmurs or gallops PMI not displaced  Abd-soft with active BS without hepatomegaly No Clubbing cyanosis edema Skin-warm and dry A & Oriented  Grossly normal sensory and motor function  ECG demonstrates AV pacing  Assessment and  Plan  Sinus node dysfunction    Pacemaker-Medtronic    Hypertension   Dyspnea on exertion  Exertional presyncope  CAD  Hx of PE     Differential diagnosis of her dyspnea is broad.  Interestingly, her neck veins are flat making recurrent pulmonary emboli unlikely, she has no nocturnal dyspnea suggesting that her LV function is probably pretty good as supported by her PMI to be nondisplaced.  With her vascular disease, this makes ischemic disease near the top of the list.  I spoke with Dr. Katrinka BlazingSmith.  He thinks that noninvasive imaging is probably unlikely to be helpful.  He recommended proceeding directly with catheterization.  I have reviewed this with the patient.  Exertional lightheadedness is concerning.  It occurs in the context of orthostatic symptoms.  There is no objective evidence for this today.  Treadmill testing might be of value but this will be deferred until after we look at her coronary vascular bed.  Given the long-standing pacing, we will undertake an echocardiogram to look at LV function.  More than 50% of 40 min was spent in counseling related to the above

## 2017-02-06 NOTE — Patient Instructions (Addendum)
Medication Instructions:  Your physician recommends that you continue on your current medications as directed. Please refer to the Current Medication list given to you today.  *If you need a refill on your cardiac medications before your next appointment, please call your pharmacy*  Labwork: Pre procedure lab work today: BMET & CBC w/ diff  Testing/Procedures: Your physician has requested that you have an echocardiogram. Echocardiography is a painless test that uses sound waves to create images of your heart. It provides your doctor with information about the size and shape of your heart and how well your heart's chambers and valves are working. This procedure takes approximately one hour. There are no restrictions for this procedure.  Your physician has requested that you have a cardiac catheterization. Cardiac catheterization is used to diagnose and/or treat various heart conditions. Doctors may recommend this procedure for a number of different reasons. The most common reason is to evaluate chest pain. Chest pain can be a symptom of coronary artery disease (CAD), and cardiac catheterization can show whether plaque is narrowing or blocking your heart's arteries. This procedure is also used to evaluate the valves, as well as measure the blood flow and oxygen levels in different parts of your heart. For further information please visit [email or web address]. Please follow instruction sheet, as given.  Follow-Up: Remote monitoring is used to monitor your Pacemaker or ICD from home. This monitoring reduces the number of office visits required to check your device to one time per year. It allows us to keep an eye on the functioning of your device to ensure it is working properly. You are scheduled for a device check from home on 03/30/2017. You may send your transmission at any time that day. If you have a wireless device, the transmission will be sent automatically. After your physician reviews your  transmission, you will receive a postcard with your next transmission date.  Your physician wants you to follow-up in: 1 year with Dr. Graciela HusbandsKlein.  You will receive a reminder letter in the mail two months in advance. If you don't receive a letter, please call our office to schedule the follow-up appointment.  Thank you for choosing [facility]!!    Any Other Special Instructions Will Be Listed Below (If Applicable).  Coronary Angiogram A coronary angiogram is an X-ray procedure that is used to examine the arteries in the heart. In this procedure, a dye (contrast dye) is injected through a long, thin tube (catheter). The catheter is inserted through the groin, wrist, or arm. The dye is injected into each artery, then X-rays are taken to show if there is a blockage in the arteries of the heart. This procedure can also show if you have valve disease or a disease of the aorta, and it can be used to check the overall function of your heart muscle. You may have a coronary angiogram if:  You are having chest pain, or other symptoms of angina, and you are at risk for heart disease.  You have an abnormal electrocardiogram (ECG) or stress test.  You have chest pain and heart failure.  You are having irregular heart rhythms.  You and your health care provider determine that the benefits of the test information outweigh the risks of the procedure.  Let your health care provider know about:  Any allergies you have, including allergies to contrast dye.  All medicines you are taking, including vitamins, herbs, eye drops, creams, and over-the-counter medicines.  Any problems you or family members have had with  anesthetic medicines.  Any blood disorders you have.  Any surgeries you have had.  History of kidney problems or kidney failure.  Any medical conditions you have.  Whether you are pregnant or may be pregnant. What are the risks? Generally, this is a safe procedure. However, problems may  occur, including:  Infection.  Allergic reaction to medicines or dyes that are used.  Bleeding from the access site or other locations.  Kidney injury, especially in people with impaired kidney function.  Stroke (rare).  Heart attack (rare).  Damage to other structures or organs.  What happens before the procedure? Staying hydrated Follow instructions from your health care provider about hydration, which may include:  Up to 2 hours before the procedure - you may continue to drink clear liquids, such as water, clear fruit juice, black coffee, and plain tea.  Eating and drinking restrictions Follow instructions from your health care provider about eating and drinking, which may include:  8 hours before the procedure - stop eating heavy meals or foods such as meat, fried foods, or fatty foods.  6 hours before the procedure - stop eating light meals or foods, such as toast or cereal.  2 hours before the procedure - stop drinking clear liquids.  General instructions  Ask your health care provider about: ? Changing or stopping your regular medicines. This is especially important if you are taking diabetes medicines or blood thinners. ? Taking medicines such as ibuprofen. These medicines can thin your blood. Do not take these medicines before your procedure if your health care provider instructs you not to, though aspirin may be recommended prior to coronary angiograms.  Plan to have someone take you home from the hospital or clinic.  You may need to have blood tests or X-rays done. What happens during the procedure?  An IV tube will be inserted into one of your veins.  You will be given one or more of the following: ? A medicine to help you relax (sedative). ? A medicine to numb the area where the catheter will be inserted into an artery (local anesthetic).  To reduce your risk of infection: ? Your health care team will wash or sanitize their hands. ? Your skin will be  washed with soap. ? Hair may be removed from the area where the catheter will be inserted.  You will be connected to a continuous ECG monitor.  The catheter will be inserted into an artery. The location may be in your groin, in your wrist, or in the fold of your arm (near your elbow).  A type of X-ray (fluoroscopy) will be used to help guide the catheter to the opening of the blood vessel that is being examined.  A dye will be injected into the catheter, and X-rays will be taken. The dye will help to show where any narrowing or blockages are located in the heart arteries.  Tell your health care provider if you have any chest pain or trouble breathing during the procedure.  If blockages are found, your health care provider may perform another procedure, such as inserting a coronary stent. The procedure may vary among health care providers and hospitals. What happens after the procedure?  After the procedure, you will need to keep the area still for a few hours, or for as long as told by your health care provider. If the procedure is done through the groin, you will be instructed to not bend and not cross your legs.  The insertion site will  be checked frequently.  The pulse in your foot or wrist will be checked frequently.  You may have additional blood tests, X-rays, and a test that records the electrical activity of your heart (ECG).  Do not drive for 24 hours if you were given a sedative. Summary  A coronary angiogram is an X-ray procedure that is used to look into the arteries in the heart.  During the procedure, a dye (contrast dye) is injected through a long, thin tube (catheter). The catheter is inserted through the groin, wrist, or arm.  Tell your health care provider about any allergies you have, including allergies to contrast dye.  After the procedure, you will need to keep the area still for a few hours, or for as long as told by your health care provider. This  information is not intended to replace advice given to you by your health care provider. Make sure you discuss any questions you have with your health care provider. Document Released: 08/28/2002 Document Revised: 12/04/2015 Document Reviewed: 12/04/2015 Elsevier Interactive Patient Education  [employer].

## 2017-02-07 LAB — CBC WITH DIFFERENTIAL/PLATELET
Basophils Absolute: 0 x10E3/uL (ref 0.0–0.2)
Basos: 0 %
EOS (ABSOLUTE): 0.2 x10E3/uL (ref 0.0–0.4)
Eos: 2 %
Hematocrit: 41.9 % (ref 34.0–46.6)
Hemoglobin: 14.2 g/dL (ref 11.1–15.9)
Immature Grans (Abs): 0 x10E3/uL (ref 0.0–0.1)
Immature Granulocytes: 0 %
Lymphocytes Absolute: 3.2 x10E3/uL — ABNORMAL HIGH (ref 0.7–3.1)
Lymphs: 37 %
MCH: 28.5 pg (ref 26.6–33.0)
MCHC: 33.9 g/dL (ref 31.5–35.7)
MCV: 84 fL (ref 79–97)
Monocytes Absolute: 0.5 x10E3/uL (ref 0.1–0.9)
Monocytes: 6 %
Neutrophils Absolute: 4.7 x10E3/uL (ref 1.4–7.0)
Neutrophils: 55 %
Platelets: 219 x10E3/uL (ref 150–379)
RBC: 4.98 x10E6/uL (ref 3.77–5.28)
RDW: 14.9 % (ref 12.3–15.4)
WBC: 8.7 x10E3/uL (ref 3.4–10.8)

## 2017-02-07 LAB — BASIC METABOLIC PANEL WITH GFR
BUN/Creatinine Ratio: 15 (ref 12–28)
BUN: 15 mg/dL (ref 8–27)
CO2: 25 mmol/L (ref 20–29)
Calcium: 9.7 mg/dL (ref 8.7–10.3)
Chloride: 96 mmol/L (ref 96–106)
Creatinine, Ser: 1.02 mg/dL — ABNORMAL HIGH (ref 0.57–1.00)
GFR calc Af Amer: 63 mL/min/1.73
GFR calc non Af Amer: 55 mL/min/1.73 — ABNORMAL LOW
Glucose: 275 mg/dL — ABNORMAL HIGH (ref 65–99)
Potassium: 4.6 mmol/L (ref 3.5–5.2)
Sodium: 134 mmol/L (ref 134–144)

## 2017-02-09 ENCOUNTER — Telehealth: Payer: Self-pay | Admitting: *Deleted

## 2017-02-09 ENCOUNTER — Encounter: Payer: Self-pay | Admitting: *Deleted

## 2017-02-09 MED ORDER — PREDNISONE 20 MG PO TABS: ORAL_TABLET | ORAL | 0 refills | Status: DC

## 2017-02-09 NOTE — Telephone Encounter (Signed)
Called patient to give her instructions for contrast media allergy, prior to her cath. Advised patient to take the following medications: 02/13/17 @ 1:30 pm -- take Prednisone 60 mg & Benadryl 25 mg 02/13/17 @ 7:30 pm -- take Prednisone 60 mg & Benadryl 25 mg Instructed to take the same medications, listed above, with her to the hospital to take 1 hour prior to her cath.  Patient verbalized understanding and agreeable to plan.   Pt verbalized being concerned about the severe winter weather arriving Sunday.  Advised patient to assess the situation around noon on Monday to determine if she will be able to make the cath on Tuesday.  If she feels she cannot, call the office and let someone know she will not be able to make it Tuesday.  She is agreeable to plan.

## 2017-02-13 ENCOUNTER — Ambulatory Visit: Payer: Medicare Other | Admitting: Family

## 2017-02-13 ENCOUNTER — Encounter (HOSPITAL_COMMUNITY): Payer: Medicare Other

## 2017-02-14 ENCOUNTER — Emergency Department (HOSPITAL_COMMUNITY): Payer: Medicare Other

## 2017-02-14 ENCOUNTER — Encounter (HOSPITAL_COMMUNITY): Payer: Self-pay | Admitting: Emergency Medicine

## 2017-02-14 ENCOUNTER — Emergency Department (HOSPITAL_COMMUNITY)
Admission: EM | Admit: 2017-02-14 | Discharge: 2017-02-15 | Disposition: A | Discharge status: Home / Self Care | Payer: Medicare Other | Attending: Emergency Medicine | Admitting: Emergency Medicine

## 2017-02-14 ENCOUNTER — Encounter (HOSPITAL_COMMUNITY): Payer: Self-pay

## 2017-02-14 ENCOUNTER — Encounter (HOSPITAL_COMMUNITY): Payer: Medicare Other

## 2017-02-14 ENCOUNTER — Ambulatory Visit (HOSPITAL_COMMUNITY): Admit: 2017-02-14 | Payer: Medicare Other | Admitting: Interventional Cardiology

## 2017-02-14 ENCOUNTER — Ambulatory Visit: Payer: Medicare Other | Admitting: Family

## 2017-02-14 DIAGNOSIS — Z950 Presence of cardiac pacemaker: Secondary | ICD-10-CM | POA: Diagnosis not present

## 2017-02-14 DIAGNOSIS — I10 Essential (primary) hypertension: Secondary | ICD-10-CM | POA: Insufficient documentation

## 2017-02-14 DIAGNOSIS — Z794 Long term (current) use of insulin: Secondary | ICD-10-CM | POA: Insufficient documentation

## 2017-02-14 DIAGNOSIS — R4182 Altered mental status, unspecified: Secondary | ICD-10-CM | POA: Diagnosis not present

## 2017-02-14 DIAGNOSIS — Z79899 Other long term (current) drug therapy: Secondary | ICD-10-CM | POA: Insufficient documentation

## 2017-02-14 DIAGNOSIS — Z7982 Long term (current) use of aspirin: Secondary | ICD-10-CM | POA: Insufficient documentation

## 2017-02-14 DIAGNOSIS — Z9049 Acquired absence of other specified parts of digestive tract: Secondary | ICD-10-CM | POA: Insufficient documentation

## 2017-02-14 DIAGNOSIS — F419 Anxiety disorder, unspecified: Secondary | ICD-10-CM | POA: Insufficient documentation

## 2017-02-14 DIAGNOSIS — E1165 Type 2 diabetes mellitus with hyperglycemia: Secondary | ICD-10-CM | POA: Insufficient documentation

## 2017-02-14 DIAGNOSIS — J45909 Unspecified asthma, uncomplicated: Secondary | ICD-10-CM | POA: Insufficient documentation

## 2017-02-14 DIAGNOSIS — Z87891 Personal history of nicotine dependence: Secondary | ICD-10-CM | POA: Diagnosis not present

## 2017-02-14 DIAGNOSIS — Z96651 Presence of right artificial knee joint: Secondary | ICD-10-CM | POA: Diagnosis not present

## 2017-02-14 DIAGNOSIS — R079 Chest pain, unspecified: Secondary | ICD-10-CM | POA: Diagnosis not present

## 2017-02-14 DIAGNOSIS — F329 Major depressive disorder, single episode, unspecified: Secondary | ICD-10-CM | POA: Insufficient documentation

## 2017-02-14 DIAGNOSIS — R739 Hyperglycemia, unspecified: Secondary | ICD-10-CM | POA: Diagnosis present

## 2017-02-14 LAB — CBG MONITORING, ED
Glucose-Capillary: 485 mg/dL — ABNORMAL HIGH (ref 65–99)
Glucose-Capillary: 371 mg/dL — ABNORMAL HIGH (ref 65–99)
Glucose-Capillary: 283 mg/dL — ABNORMAL HIGH (ref 65–99)

## 2017-02-14 LAB — COMPREHENSIVE METABOLIC PANEL
ALK PHOS: 120 U/L (ref 38–126)
ALT: 37 U/L (ref 14–54)
ANION GAP: 12 (ref 5–15)
AST: 38 U/L (ref 15–41)
Albumin: 4.7 g/dL (ref 3.5–5.0)
BUN: 23 mg/dL — ABNORMAL HIGH (ref 6–20)
CALCIUM: 9.6 mg/dL (ref 8.9–10.3)
CO2: 21 mmol/L — AB (ref 22–32)
CREATININE: 1 mg/dL (ref 0.44–1.00)
Chloride: 92 mmol/L — ABNORMAL LOW (ref 101–111)
GFR, EST NON AFRICAN AMERICAN: 55 mL/min — AB (ref >60)
Glucose, Bld: 484 mg/dL — ABNORMAL HIGH (ref 65–99)
Potassium: 4.6 mmol/L (ref 3.5–5.1)
SODIUM: 125 mmol/L — AB (ref 135–145)
Total Bilirubin: 1.5 mg/dL — ABNORMAL HIGH (ref 0.3–1.2)
Total Protein: 7.5 g/dL (ref 6.5–8.1)

## 2017-02-14 LAB — I-STAT TROPONIN, ED: Troponin i, poc: 0 ng/mL (ref 0.00–0.08)

## 2017-02-14 LAB — URINALYSIS, ROUTINE W REFLEX MICROSCOPIC
Bacteria, UA: NONE SEEN
Bilirubin Urine: NEGATIVE
KETONES UR: NEGATIVE mg/dL
LEUKOCYTES UA: NEGATIVE
Nitrite: NEGATIVE
PH: 5 (ref 5.0–8.0)
PROTEIN: NEGATIVE mg/dL
Specific Gravity, Urine: 1.033 — ABNORMAL HIGH (ref 1.005–1.030)

## 2017-02-14 LAB — CBC
HCT: 45.1 % (ref 36.0–46.0)
HEMOGLOBIN: 16.2 g/dL — AB (ref 12.0–15.0)
MCH: 29.4 pg (ref 26.0–34.0)
MCHC: 35.9 g/dL (ref 30.0–36.0)
MCV: 81.9 fL (ref 78.0–100.0)
Platelets: 191 10*3/uL (ref 150–400)
RBC: 5.51 MIL/uL — AB (ref 3.87–5.11)
RDW: 13.9 % (ref 11.5–15.5)
WBC: 14.2 10*3/uL — AB (ref 4.0–10.5)

## 2017-02-14 SURGERY — RIGHT/LEFT HEART CATH AND CORONARY ANGIOGRAPHY
Anesthesia: LOCAL

## 2017-02-14 MED ORDER — NEBIVOLOL HCL 10 MG PO TABS
10.0000 mg | ORAL_TABLET | Freq: Two times a day (BID) | ORAL | Status: DC
  Administered 2017-02-14: 10 mg via ORAL
  Filled 2017-02-14: qty 1

## 2017-02-14 MED ORDER — DILTIAZEM HCL ER COATED BEADS 120 MG PO CP24
120.0000 mg | ORAL_CAPSULE | Freq: Every day | ORAL | Status: DC
  Administered 2017-02-14: 120 mg via ORAL
  Filled 2017-02-14: qty 1

## 2017-02-14 MED ORDER — INSULIN ASPART 100 UNIT/ML ~~LOC~~ SOLN
12.0000 [IU] | Freq: Once | SUBCUTANEOUS | Status: AC
  Administered 2017-02-14: 12 [IU] via SUBCUTANEOUS
  Filled 2017-02-14: qty 1

## 2017-02-14 MED ORDER — SODIUM CHLORIDE 0.9 % IV BOLUS (SEPSIS)
500.0000 mL | Freq: Once | INTRAVENOUS | Status: AC
  Administered 2017-02-14: 500 mL via INTRAVENOUS

## 2017-02-14 NOTE — ED Notes (Signed)
Patient transported to X-ray 

## 2017-02-14 NOTE — ED Notes (Signed)
ED Provider at bedside. Resident MD 

## 2017-02-14 NOTE — ED Provider Notes (Signed)
[facility] EMERGENCY DEPARTMENT Provider Note   CSN: [id number] Arrival date & time: 02/14/17  1840  History   Chief Complaint Chief Complaint  Patient presents with  . Altered Mental Status  . Hyperglycemia   HPI [name] is a 71 y.o. 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   Patient responds to questions and is able to converse but family at bedside states that her answers are sometimes inaccurate. Husband and sister states that the patient has been confused for greater than 6 months. She has struggled with executive function including driving, following instructions, and decision making. She also appears to struggle with short term memory. Family is unsure the cause and has not brought up the issue with her PCP. They have noticed changes in her personality as well. Patient states that this is true and she is unsure why. She feels her husband hates her and that she and her sister are not close.   ROS is likely inaccurate due to the patient mental status but she denies fevers, chills, N/V, changes in appetite, changes in sleep, tremor, visual changes, abdominal pain, dysuria. She endorses urinary incontinence, increased frequency, and gait disturbances. Family reciprocates the gait changes, states that she shuffles now. Her episodes of confusion are worse when her blood sugars go high. She did not take her insulin this AM because she was supposed to have a heart cath. She canceled her heart cath due to transportation issues. Patient and her family are concerned she has alzheimer's dementia.   Past Medical History:  Diagnosis Date  . Allergic rhinitis, cause unspecified   . Anxiety state, unspecified   . Arthritis   . Atrial fibrillation (HCC)   . Bronchitis, not specified as acute or chronic   . Cardiac  pacemaker medtronic   . 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 Depressive disorder, not elsewhere classified   . Disorders of sacrum   . GERD (gastroesophageal reflux disease)   . Headache(784.0)    history of  . Heart murmur   . History of hiatal hernia   . Hypertension   . LBBB (left bundle branch block)   . Myalgia and myositis, unspecified   . Obstructive sleep apnea (adult) (pediatric)   . Other voice and resonance disorders   . Pain in joint, lower leg    bilateral  . Presence of permanent cardiac pacemaker   . Pulmonary embolism (HCC)   . Rash, skin 2 weeks per patient   right arm, inner aspect    . Rheumatic fever in pediatric patient   . Total knee replacement status   . Type II or unspecified type diabetes mellitus without mention of complication, not stated as uncontrolled    Type II  . Unspecified asthma(493.90)   . 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 Vertigo    Meclizine (Antivert)   Patient Active Problem List   Diagnosis Date Noted  . Vertigo   . Total knee replacement status   . Rheumatic fever in pediatric patient   . Rash, skin   . Pulmonary embolism (HCC)   . Presence of permanent cardiac pacemaker   . Obstructive sleep apnea (adult) (pediatric)   . LBBB (left bundle branch block)   . History of hiatal hernia   . Heart murmur   .  Atrial fibrillation (HCC)   . Arthritis   . Toxic metabolic encephalopathy 05/20/2016  . Cervical spinal stenosis 04/27/2016  . Aftercare following surgery 04/27/2016  . UTI (urinary tract infection) 10/22/2015  . Hyponatremia 10/22/2015  . Hypokalemia 10/22/2015  . AKI (acute kidney injury) (HCC) 10/22/2015  . Delirium 10/21/2015  . Asymptomatic carotid artery stenosis 10/31/2014  . Left carotid bruit 08/26/2013  . Palpitations 06/24/2013  . 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 Hypertension   . Itching  01/03/2013  . GERD (gastroesophageal reflux disease) 11/23/2011  . PULMONARY EMBOLISM, hx of 12/22/2008  . RHINOCONJUNCTIVITIS, ALLERGIC 12/20/2007  . SACROILIAC JOINT DYSFUNCTION 09/28/2007  . Depressive disorder, not elsewhere classified 08/28/2007  . KNEE PAIN, RIGHT 08/28/2007  . PAIN IN JOINT OTHER SPECIFIED SITES 08/28/2007  . 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 CARDIAC PACEMAKER IN SITU 06/06/2007  . Diabetes mellitus, type 2 (HCC) 05/16/2007  . ANXIETY 05/16/2007  . Obstructive sleep apnea 05/16/2007  . H/O: CVA (cerebrovascular accident) 05/16/2007  . BRONCHITIS 05/16/2007  . FIBROMYALGIA 05/16/2007  . FACIAL PAIN 05/16/2007  . DYSPHONIA 05/16/2007  . OTHER DYSPHAGIA 05/16/2007   Past Surgical History:  Procedure Laterality Date  . ABDOMINAL HYSTERECTOMY    . ANTERIOR CERVICAL DECOMP/DISCECTOMY FUSION N/A 04/27/2016   Procedure: C5-6, C6-7 Anterior Cervical Discectomy and Fusion, Allograft, Plate;  Surgeon: Eldred MangesMark C Yates, MD;  Location: [facility] OR;  Service: Orthopedics;  Laterality: N/A;  . BALLOON DILATION N/A 06/26/2013   Procedure: Marvis RepressBALLOON DILATION;  Surgeon: Vertell NovakJames L Edwards Jr., MD;  Location: [facility] ENDOSCOPY;  Service: Endoscopy;  Laterality: N/A;  . BILATERAL SALPINGOOPHORECTOMY  1991  . CHOLECYSTECTOMY    . COLONOSCOPY N/A 06/26/2013   Procedure: COLONOSCOPY;  Surgeon: Vertell NovakJames L Edwards Jr., MD;  [facility];  Service: Endoscopy;  Laterality: N/A;  . ELBOW SURGERY Bilateral   . ENDARTERECTOMY Left 10/31/2014   Procedure: LEFT CAROTID ENDARTERECTOMY WITH BOVINE PERICARDIAL PATCH ANGIOPLASTY;  Surgeon: Nada LibmanVance W Brabham, MD;  Location: [facility] OR;  Service: Vascular;  Laterality: Left;  . ESOPHAGOGASTRODUODENOSCOPY  11/02/2011   Procedure: ESOPHAGOGASTRODUODENOSCOPY (EGD);  Surgeon: Vertell NovakJames L Edwards Jr., MD;  [facility];  Service: Endoscopy;  Laterality: N/A;  with xray  . ESOPHAGOGASTRODUODENOSCOPY N/A 06/26/2013   Procedure: ESOPHAGOGASTRODUODENOSCOPY (EGD);   Surgeon: Vertell NovakJames L Edwards Jr., MD;  [facility];  Service: Endoscopy;  Laterality: N/A;  . INSERT / REPLACE / REMOVE PACEMAKER     insertion 2007  . NASAL FRACTURE SURGERY    . SAVORY DILATION  11/02/2011   Procedure: SAVORY DILATION;  Surgeon: Vertell NovakJames L Edwards Jr., MD;  [facility];  Service: Endoscopy;  Laterality: N/A;  . TEMPOROMANDIBULAR JOINT ARTHROPLASTY  1982  . TOTAL KNEE ARTHROPLASTY  2001, 2010, 2012   right  . total knee repair  2010   right  . VESICOVAGINAL FISTULA CLOSURE W/ TAH  1974   OB History    No data available     Home Medications    Prior to Admission medications   Medication Sig Start Date End Date Taking? Authorizing Provider  aspirin EC 81 MG tablet Take 81 mg by mouth daily.   Yes [provider]  atorvastatin (LIPITOR) 20 MG tablet Take 20 mg by mouth daily.     Yes [provider]  cetirizine (ZYRTEC) 10 MG tablet Take 10 mg by mouth daily.   Yes [provider]  cycloSPORINE (RESTASIS) 0.05 % ophthalmic emulsion Place 1 drop into both eyes daily.  Yes [provider]  diltiazem (CARDIZEM CD) 120 MG 24 hr capsule Take 1 capsule (120 mg total) by mouth daily. 02/02/16  Yes Sheilah Pigeon, PA-C  EPINEPHrine 0.3 mg/0.3 mL IJ SOAJ injection Inject 0.3 mg into the muscle once as needed (FOR ALLERGIES OR ALLERGIC REACTION).    Yes [provider]  HUMULIN N KWIKPEN 100 UNIT/ML Kiwkpen Inject 10 Units into the skin 2 (two) times daily. Patient taking differently: Inject 20-40 Units into the skin See admin instructions. 20 units in the morning before breakfast and 40 units before supper 05/22/16  Yes Samtani, Alta Corning, MD  MYRBETRIQ 50 MG TB24 tablet Take 50 mg by mouth daily. 01/20/17  Yes [provider]  nebivolol (BYSTOLIC) 10 MG tablet TAKE 1 TABLET BY MOUTH TWICE A DAY. Patient taking differently: Take 10 mg by mouth 2 (two) times daily.  03/17/16  Yes Janetta Hora, PA-C    Omega-3 Fatty Acids (FISH OIL) 1200 MG CAPS Take 2,400 mg by mouth daily.    Yes [provider]  polyethylene glycol powder (GLYCOLAX/MIRALAX) powder Take 17 g by mouth daily as needed for moderate constipation.  01/17/16  Yes [provider]  predniSONE (DELTASONE) 20 MG tablet Take as directed 02/09/17  Yes Duke Salvia, MD  hydrochlorothiazide (HYDRODIURIL) 25 MG tablet Take 0.5 tablets (12.5 mg total) by mouth daily. Patient not taking: Reported on 02/14/2017 04/27/15   Duke Salvia, MD  HYDROcodone-acetaminophen [facility]) 5-325 MG tablet Take 1-2 tablets by mouth every 6 (six) hours as needed for moderate pain. Patient not taking: Reported on 02/14/2017 04/28/16   Eldred Manges, MD   Family History Family History  Problem Relation Age of Onset  . Cancer Father        LUNG  . Alcohol abuse Father   . Diabetes Mother   . Heart disease Mother   . Stroke Mother   . Heart attack Mother   . Hypertension Mother   . Deep vein thrombosis Mother   . Hyperlipidemia Mother   . Varicose Veins Mother   . Peripheral vascular disease Mother        amputation  . Lupus Sister   . Diabetes Sister   . Aneurysm Sister   . Deep vein thrombosis Sister   . Hyperlipidemia Sister   . Hypertension Sister   . Varicose Veins Sister   . Heart disease Sister   . Deep vein thrombosis Sister   . Diabetes Sister   . Hyperlipidemia Sister   . Hypertension Sister   . Varicose Veins Sister    Social History Social History   Tobacco Use  . Smoking status: Former Smoker    Packs/day: 0.30    Years: 20.00    Pack years: 6.00    Types: Cigarettes    Last attempt to quit: 2007    Years since quitting: 11.9  . Smokeless tobacco: Never Used  . Tobacco comment: pt smoked less than 1/4 ppd  Substance Use Topics  . Alcohol use: No    Alcohol/week: 0.0 oz  . Drug use: No    Comment: "now, marijuana I would do..." - but doesn't by report   Allergies   Cephalexin; Bactrim  [sulfamethoxazole-trimethoprim]; Penicillins; Atenolol; Cephalosporins; Chromic sulfate; Codeine; Desmopressin; Dexilant [dexlansoprazole]; Doxycycline; Entex; Farxiga [dapagliflozin]; Gabapentin; Levaquin [levofloxacin]; Lexapro [escitalopram]; Lisinopril; Losartan potassium; Metformin; Other; Pregabalin; Rosiglitazone maleate; Simvastatin; Sitagliptin; Tradjenta [linagliptin]; and Contrast media [iodinated diagnostic agents]  Review of Systems  All systems reviewed and are  negative for acute change except as noted in the HPI.  Physical Exam Updated Vital Signs BP 111/87   Pulse 69   Temp 98.6 F (37 C) (Oral)   Resp 20   Ht 5\' 3"  (1.6 m)   Wt 63.5 kg (140 lb)   SpO2 100%   BMI 24.80 kg/m   General: Elderly female in no acute distress HENT: Normocephalic, atraumatic, moist mucus membranes Pulm: Good air movement with no wheezing or crackles  CV: Distant heart sounds, RRR, no murmurs or rubs appreciated  Abdomen: Active bowel sounds, soft, non-distended, no tenderness to palpation Extremities: No LE edema, multiple excoriations on the upper and lower extremities  Skin: Warm and dry  Neuro: Alert, oriented to person, place and time. Cranial nerves intact bilaterally. Gross strength 5/5 in the upper and lower extremities. She does have some rigidity in the upper extremities bilaterally. No tremor noted. Sensation to light touch intact in the upper and lower extremities.   ED Treatments / Results  Labs (all labs ordered are listed, but only abnormal results are displayed) Labs Reviewed  COMPREHENSIVE METABOLIC PANEL - Abnormal; Notable for the following components:      Result Value   Sodium 125 (*)    Chloride 92 (*)    CO2 21 (*)    Glucose, Bld 484 (*)    BUN 23 (*)    Total Bilirubin 1.5 (*)    GFR calc non Af Amer 55 (*)    All other components within normal limits  CBC - Abnormal; Notable for the following components:   WBC 14.2 (*)    RBC 5.51 (*)    Hemoglobin 16.2  (*)    All other components within normal limits  URINALYSIS, ROUTINE W REFLEX MICROSCOPIC - Abnormal; Notable for the following components:   Specific Gravity, Urine 1.033 (*)    Glucose, UA >=500 (*)    Hgb urine dipstick SMALL (*)    Squamous Epithelial / LPF 0-5 (*)    All other components within normal limits  CBG MONITORING, ED - Abnormal; Notable for the following components:   Glucose-Capillary 485 (*)    All other components within normal limits  CBG MONITORING, ED - Abnormal; Notable for the following components:   Glucose-Capillary 371 (*)    All other components within normal limits  I-STAT CHEM 8, ED - Abnormal; Notable for the following components:   Potassium 3.4 (*)    Chloride 94 (*)    Glucose, Bld 290 (*)    All other components within normal limits  CBG MONITORING, ED - Abnormal; Notable for the following components:   Glucose-Capillary 283 (*)    All other components within normal limits  I-STAT TROPONIN, ED   EKG  EKG Interpretation  Date/Time:  Tuesday February 14 2017 18:45:51 EST Ventricular Rate:  69 PR Interval:  202 QRS Duration: 158 QT Interval:  440 QTC Calculation: 471 R Axis:   -142 Text Interpretation:  Electronic ventricular pacemaker No significant change since last tracing Confirmed by Drema Pry [phone]) on 02/14/2017 9:38:55 PM      Radiology Dg Chest 2 View  Result Date: 02/14/2017 CLINICAL DATA:  Episodic chest pain, ongoing confusion. EXAM: CHEST  2 VIEW COMPARISON:  06/09/2016 FINDINGS: Normal heart size and mediastinal contours. Left-sided pacemaker apparatus right atrial and right ventricular leads are noted. Anterior cervical fusion hardware is seen of the included lower cervical spine. The lungs are clear without pneumonic consolidations. No overt pulmonary edema. There is  minimal atelectasis at the left lung base. No acute osseous abnormality. IMPRESSION: No active cardiopulmonary disease. Electronically Signed   By: Tollie Ethavid   Kwon M.D.   On: 02/14/2017 21:16   Procedures Procedures (including critical care time)  Medications Ordered in ED Medications  diltiazem (CARDIZEM CD) 24 hr capsule 120 mg (120 mg Oral Given 02/14/17 2245)  nebivolol (BYSTOLIC) tablet 10 mg (10 mg Oral Given 02/14/17 2245)  sodium chloride 0.9 % bolus 500 mL (0 mLs Intravenous Stopped 02/14/17 2230)  insulin aspart (novoLOG) injection 12 Units (12 Units Subcutaneous Given 02/14/17 2201)   Initial Impression / Assessment and Plan / ED Course  I have reviewed the triage vital signs and the nursing notes.  Pertinent labs & imaging results that were available during my care of the patient were reviewed by me and considered in my medical decision making (see chart for details).    Patient presented with a 6 month history of periodic confusion and gait disturbances. Initially she was found to be hyperglycemic and hyponatremic. She was treated with IV fluids and insulin with improvement of both. Work-up for infectious causes were unremarkable including CXR and UA. EKG and troponin were reassuring to rule out ACS.   Her altered mental status does not appear to metabolic or infectious. This appears to be a chronic issue based on the reported history. I discussed that she needs follow-up with her PCP and possible referral to a neurologist. She and family at bedside voice understanding of the plan and agree with the course of action. All questions and concerns addressed. Patient is stable for discharge.   Final Clinical Impressions(s) / ED Diagnoses   Final diagnoses:  Hyperglycemia  Altered mental status, unspecified altered mental status type   ED Discharge Orders    None       Levora DredgeHelberg, Antiono Ettinger, MD 02/15/17 0023    Nira Connardama, Pedro Eduardo, MD 02/15/17 [phone]

## 2017-02-14 NOTE — ED Triage Notes (Addendum)
Pt brought in by family for ongoing confusion that family reports has been progressing for the past 6 months. Pt had cardiac cath scheduled this am but procedure was cancelled due to weather, per families report. Pt a/ox4 but rambling in triage, unable to report reason she is in ED but reports some off and on chest pain, resp e/u, nad.

## 2017-02-14 NOTE — ED Notes (Signed)
CBG 485; RN notified

## 2017-02-15 ENCOUNTER — Other Ambulatory Visit (HOSPITAL_COMMUNITY): Payer: Medicare Other

## 2017-02-15 DIAGNOSIS — E1165 Type 2 diabetes mellitus with hyperglycemia: Secondary | ICD-10-CM | POA: Diagnosis not present

## 2017-02-15 LAB — I-STAT CHEM 8, ED
BUN: 20 mg/dL (ref 6–20)
Calcium, Ion: 1.19 mmol/L (ref 1.15–1.40)
Chloride: 94 mmol/L — ABNORMAL LOW (ref 101–111)
Creatinine, Ser: 0.7 mg/dL (ref 0.44–1.00)
GLUCOSE: 290 mg/dL — AB (ref 65–99)
HEMATOCRIT: 41 % (ref 36.0–46.0)
HEMOGLOBIN: 13.9 g/dL (ref 12.0–15.0)
POTASSIUM: 3.4 mmol/L — AB (ref 3.5–5.1)
SODIUM: 135 mmol/L (ref 135–145)
TCO2: 27 mmol/L (ref 22–32)

## 2017-02-15 NOTE — ED Notes (Signed)
Pt verbalized understanding discharge instructions and denies any further needs or questions at this time. VS stable, ambulatory and steady gait.   

## 2017-02-15 NOTE — Discharge Instructions (Signed)
Continue to take your medications as prescribed. Please follow-up with your primary care doctor as soon as possible to discuss these episodes of confusion.

## 2017-02-16 ENCOUNTER — Telehealth: Payer: Self-pay | Admitting: Internal Medicine

## 2017-02-16 MED ORDER — PREDNISONE 50 MG PO TABS: ORAL_TABLET | ORAL | 0 refills | Status: AC

## 2017-02-16 NOTE — Telephone Encounter (Signed)
New Message   .Marland Kitchen.Pt c/o of Chest Pain: 1. Are you having CP right now? yes 2. Are you experiencing any other symptoms (ex. SOB, nausea, vomiting, sweating)?  SOB, congestion, having a hard time breathing 3. How long have you been experiencing CP? For about a week 4. Is your CP continuous or coming and going? Coming and going 5. Have you taken Nitroglycerin? No, patient states that she does not have any   Patient states that she was scheduled to have a heart cath on 12/11.

## 2017-02-16 NOTE — Telephone Encounter (Signed)
Patient calling complaining of chest pain, left side of neck pain, and SOB. Patient stated she was scheduled for a heart cath on 02/14/17. Patient cancelled cath due to transportation issues and snow. Patient ended up being in the ED on 02/14/17 for hyperglycemia and AMS. This might have been due to her taking prednisone for allergy to IV contrast. Informed patient that her heart cath needs to be rescheduled. Encouraged patient to go to ED with her symptoms, patient refusing to go to ED. Informed patient that if she is not going to go to the ED that if her symptoms get worse to call 911. Patient stated she does not want to do this either. Informed patient that her message would be sent to Dr. Graciela HusbandsKlein and his nurse to see when she can be rescheduled for heart cath. Patient verbalized understanding.

## 2017-02-16 NOTE — Telephone Encounter (Signed)
Spoke with pt and rescheduled her for heart cath on 02/20/17 with Dr. Katrinka BlazingSmith.  Went over Prednisone and Benadryl instructions for prior to cath.  Pt had me repeat instructions to her husband.  Husband repeated back to me.  Both verbalized understanding and was appreciative for assistance.

## 2017-02-17 ENCOUNTER — Telehealth: Payer: Self-pay

## 2017-02-17 NOTE — Telephone Encounter (Signed)
[facility] scheduled for:  02/20/2017 @ 0730 Verified arrival time and place: NT @ 0530 Confirmed AM meds to be taken pre-cath with sip of water: Prednisone:  Sunday 5:30 pm-11:30 pm-Monday 5:30 am with 50 mg Benadryl and ASA Hold HCTZ Confirmed patient has responsible person to drive home post procedure and observe patient for 24 hours:  yes Addl concerns:

## 2017-02-20 ENCOUNTER — Ambulatory Visit (HOSPITAL_COMMUNITY)
Admission: RE | Admit: 2017-02-20 | Discharge: 2017-02-20 | Disposition: A | Discharge status: Home / Self Care | Payer: Medicare Other | Source: Ambulatory Visit | Attending: Interventional Cardiology | Admitting: Interventional Cardiology

## 2017-02-20 ENCOUNTER — Encounter (HOSPITAL_COMMUNITY): Payer: Self-pay | Admitting: Interventional Cardiology

## 2017-02-20 ENCOUNTER — Encounter (HOSPITAL_COMMUNITY)
Admission: RE | Disposition: A | Discharge status: Home / Self Care | Payer: Self-pay | Source: Ambulatory Visit | Attending: Interventional Cardiology

## 2017-02-20 DIAGNOSIS — Z79899 Other long term (current) drug therapy: Secondary | ICD-10-CM | POA: Insufficient documentation

## 2017-02-20 DIAGNOSIS — Z86711 Personal history of pulmonary embolism: Secondary | ICD-10-CM | POA: Diagnosis not present

## 2017-02-20 DIAGNOSIS — R51 Headache: Secondary | ICD-10-CM | POA: Diagnosis not present

## 2017-02-20 DIAGNOSIS — M7989 Other specified soft tissue disorders: Secondary | ICD-10-CM | POA: Insufficient documentation

## 2017-02-20 DIAGNOSIS — I2510 Atherosclerotic heart disease of native coronary artery without angina pectoris: Secondary | ICD-10-CM | POA: Insufficient documentation

## 2017-02-20 DIAGNOSIS — R42 Dizziness and giddiness: Secondary | ICD-10-CM | POA: Insufficient documentation

## 2017-02-20 DIAGNOSIS — F419 Anxiety disorder, unspecified: Secondary | ICD-10-CM | POA: Diagnosis not present

## 2017-02-20 DIAGNOSIS — Z7901 Long term (current) use of anticoagulants: Secondary | ICD-10-CM | POA: Insufficient documentation

## 2017-02-20 DIAGNOSIS — Z888 Allergy status to other drugs, medicaments and biological substances status: Secondary | ICD-10-CM | POA: Insufficient documentation

## 2017-02-20 DIAGNOSIS — M542 Cervicalgia: Secondary | ICD-10-CM | POA: Diagnosis not present

## 2017-02-20 DIAGNOSIS — I10 Essential (primary) hypertension: Secondary | ICD-10-CM | POA: Insufficient documentation

## 2017-02-20 DIAGNOSIS — R079 Chest pain, unspecified: Secondary | ICD-10-CM

## 2017-02-20 DIAGNOSIS — I495 Sick sinus syndrome: Secondary | ICD-10-CM | POA: Insufficient documentation

## 2017-02-20 DIAGNOSIS — G4733 Obstructive sleep apnea (adult) (pediatric): Secondary | ICD-10-CM | POA: Insufficient documentation

## 2017-02-20 DIAGNOSIS — J45909 Unspecified asthma, uncomplicated: Secondary | ICD-10-CM | POA: Insufficient documentation

## 2017-02-20 DIAGNOSIS — Z950 Presence of cardiac pacemaker: Secondary | ICD-10-CM | POA: Diagnosis not present

## 2017-02-20 DIAGNOSIS — M609 Myositis, unspecified: Secondary | ICD-10-CM | POA: Diagnosis not present

## 2017-02-20 DIAGNOSIS — R0602 Shortness of breath: Secondary | ICD-10-CM | POA: Diagnosis not present

## 2017-02-20 DIAGNOSIS — Z9049 Acquired absence of other specified parts of digestive tract: Secondary | ICD-10-CM | POA: Insufficient documentation

## 2017-02-20 DIAGNOSIS — I498 Other specified cardiac arrhythmias: Secondary | ICD-10-CM | POA: Insufficient documentation

## 2017-02-20 DIAGNOSIS — Z882 Allergy status to sulfonamides status: Secondary | ICD-10-CM | POA: Insufficient documentation

## 2017-02-20 DIAGNOSIS — F329 Major depressive disorder, single episode, unspecified: Secondary | ICD-10-CM | POA: Diagnosis not present

## 2017-02-20 DIAGNOSIS — Z90710 Acquired absence of both cervix and uterus: Secondary | ICD-10-CM | POA: Insufficient documentation

## 2017-02-20 DIAGNOSIS — R011 Cardiac murmur, unspecified: Secondary | ICD-10-CM | POA: Insufficient documentation

## 2017-02-20 DIAGNOSIS — I4891 Unspecified atrial fibrillation: Secondary | ICD-10-CM | POA: Diagnosis not present

## 2017-02-20 DIAGNOSIS — Z86718 Personal history of other venous thrombosis and embolism: Secondary | ICD-10-CM | POA: Diagnosis not present

## 2017-02-20 DIAGNOSIS — E119 Type 2 diabetes mellitus without complications: Secondary | ICD-10-CM | POA: Insufficient documentation

## 2017-02-20 DIAGNOSIS — I447 Left bundle-branch block, unspecified: Secondary | ICD-10-CM | POA: Diagnosis not present

## 2017-02-20 DIAGNOSIS — K449 Diaphragmatic hernia without obstruction or gangrene: Secondary | ICD-10-CM | POA: Insufficient documentation

## 2017-02-20 DIAGNOSIS — Z8673 Personal history of transient ischemic attack (TIA), and cerebral infarction without residual deficits: Secondary | ICD-10-CM | POA: Insufficient documentation

## 2017-02-20 DIAGNOSIS — R498 Other voice and resonance disorders: Secondary | ICD-10-CM | POA: Diagnosis not present

## 2017-02-20 DIAGNOSIS — Z794 Long term (current) use of insulin: Secondary | ICD-10-CM | POA: Insufficient documentation

## 2017-02-20 DIAGNOSIS — M1990 Unspecified osteoarthritis, unspecified site: Secondary | ICD-10-CM | POA: Diagnosis not present

## 2017-02-20 DIAGNOSIS — Z880 Allergy status to penicillin: Secondary | ICD-10-CM | POA: Insufficient documentation

## 2017-02-20 DIAGNOSIS — Z7982 Long term (current) use of aspirin: Secondary | ICD-10-CM | POA: Insufficient documentation

## 2017-02-20 DIAGNOSIS — J40 Bronchitis, not specified as acute or chronic: Secondary | ICD-10-CM | POA: Diagnosis present

## 2017-02-20 DIAGNOSIS — K219 Gastro-esophageal reflux disease without esophagitis: Secondary | ICD-10-CM | POA: Diagnosis not present

## 2017-02-20 DIAGNOSIS — I6522 Occlusion and stenosis of left carotid artery: Secondary | ICD-10-CM | POA: Insufficient documentation

## 2017-02-20 DIAGNOSIS — Z96651 Presence of right artificial knee joint: Secondary | ICD-10-CM | POA: Insufficient documentation

## 2017-02-20 DIAGNOSIS — Z881 Allergy status to other antibiotic agents status: Secondary | ICD-10-CM | POA: Insufficient documentation

## 2017-02-20 HISTORY — PX: RIGHT/LEFT HEART CATH AND CORONARY ANGIOGRAPHY: CATH118266

## 2017-02-20 LAB — POCT I-STAT 3, VENOUS BLOOD GAS (G3P V)
ACID-BASE DEFICIT: 2 mmol/L (ref 0.0–2.0)
Bicarbonate: 24 mmol/L (ref 20.0–28.0)
O2 Saturation: 75 %
PO2 VEN: 42 mmHg (ref 32.0–45.0)
TCO2: 25 mmol/L (ref 22–32)
pCO2, Ven: 43.5 mmHg — ABNORMAL LOW (ref 44.0–60.0)
pH, Ven: 7.35 (ref 7.250–7.430)

## 2017-02-20 LAB — BASIC METABOLIC PANEL
ANION GAP: 9 (ref 5–15)
BUN: 17 mg/dL (ref 6–20)
CALCIUM: 9.6 mg/dL (ref 8.9–10.3)
CO2: 26 mmol/L (ref 22–32)
Chloride: 98 mmol/L — ABNORMAL LOW (ref 101–111)
Creatinine, Ser: 0.83 mg/dL (ref 0.44–1.00)
GFR calc Af Amer: >60 mL/min (ref >60)
Glucose, Bld: 368 mg/dL — ABNORMAL HIGH (ref 65–99)
POTASSIUM: 4.3 mmol/L (ref 3.5–5.1)
SODIUM: 133 mmol/L — AB (ref 135–145)

## 2017-02-20 LAB — POCT I-STAT 3, ART BLOOD GAS (G3+)
Acid-base deficit: 2 mmol/L (ref 0.0–2.0)
Bicarbonate: 23.8 mmol/L (ref 20.0–28.0)
O2 Saturation: 99 %
TCO2: 25 mmol/L (ref 22–32)
pCO2 arterial: 42.2 mmHg (ref 32.0–48.0)
pH, Arterial: 7.359 (ref 7.350–7.450)
pO2, Arterial: 134 mmHg — ABNORMAL HIGH (ref 83.0–108.0)

## 2017-02-20 LAB — PROTIME-INR
INR: 0.98
PROTHROMBIN TIME: 12.9 s (ref 11.4–15.2)

## 2017-02-20 LAB — GLUCOSE, CAPILLARY
GLUCOSE-CAPILLARY: 354 mg/dL — AB (ref 65–99)
GLUCOSE-CAPILLARY: 282 mg/dL — AB (ref 65–99)

## 2017-02-20 SURGERY — RIGHT/LEFT HEART CATH AND CORONARY ANGIOGRAPHY
Anesthesia: LOCAL

## 2017-02-20 MED ORDER — SODIUM CHLORIDE 0.9 % IV SOLN: 250.0000 mL | INTRAVENOUS | Status: DC | PRN

## 2017-02-20 MED ORDER — SODIUM CHLORIDE 0.9 % WEIGHT BASED INFUSION
3.0000 mL/kg/h | INTRAVENOUS | Status: AC
  Administered 2017-02-20: 3 mL/kg/h via INTRAVENOUS

## 2017-02-20 MED ORDER — HEPARIN SODIUM (PORCINE) 1000 UNIT/ML IJ SOLN
INTRAMUSCULAR | Status: DC | PRN
  Administered 2017-02-20: 4000 [IU] via INTRAVENOUS

## 2017-02-20 MED ORDER — VERAPAMIL HCL 2.5 MG/ML IV SOLN
INTRAVENOUS | Status: AC
  Filled 2017-02-20: qty 2

## 2017-02-20 MED ORDER — FENTANYL CITRATE (PF) 100 MCG/2ML IJ SOLN
INTRAMUSCULAR | Status: AC
  Filled 2017-02-20: qty 2

## 2017-02-20 MED ORDER — FENTANYL CITRATE (PF) 100 MCG/2ML IJ SOLN
INTRAMUSCULAR | Status: DC | PRN
  Administered 2017-02-20: 50 ug via INTRAVENOUS

## 2017-02-20 MED ORDER — ASPIRIN 81 MG PO CHEW
CHEWABLE_TABLET | ORAL | Status: AC
  Filled 2017-02-20: qty 1

## 2017-02-20 MED ORDER — SODIUM CHLORIDE 0.9% FLUSH: 3.0000 mL | INTRAVENOUS | Status: DC | PRN

## 2017-02-20 MED ORDER — HEPARIN (PORCINE) IN NACL 2-0.9 UNIT/ML-% IJ SOLN
INTRAMUSCULAR | Status: AC
  Filled 2017-02-20: qty 1000

## 2017-02-20 MED ORDER — IOPAMIDOL (ISOVUE-370) INJECTION 76%
INTRAVENOUS | Status: DC | PRN
  Administered 2017-02-20: 60 mL via INTRAVENOUS

## 2017-02-20 MED ORDER — IOPAMIDOL (ISOVUE-370) INJECTION 76%
INTRAVENOUS | Status: AC
  Filled 2017-02-20: qty 100

## 2017-02-20 MED ORDER — ASPIRIN 81 MG PO CHEW
81.0000 mg | CHEWABLE_TABLET | ORAL | Status: AC
  Administered 2017-02-20: 81 mg via ORAL

## 2017-02-20 MED ORDER — LIDOCAINE HCL (PF) 1 % IJ SOLN
INTRAMUSCULAR | Status: DC | PRN
  Administered 2017-02-20 (×2): 2 mL

## 2017-02-20 MED ORDER — ACETAMINOPHEN 325 MG PO TABS: 650.0000 mg | ORAL_TABLET | ORAL | Status: DC | PRN

## 2017-02-20 MED ORDER — SODIUM CHLORIDE 0.9 % IV SOLN: INTRAVENOUS | Status: DC

## 2017-02-20 MED ORDER — SODIUM CHLORIDE 0.9% FLUSH: 3.0000 mL | Freq: Two times a day (BID) | INTRAVENOUS | Status: DC

## 2017-02-20 MED ORDER — LIDOCAINE HCL (PF) 1 % IJ SOLN
INTRAMUSCULAR | Status: AC
  Filled 2017-02-20: qty 30

## 2017-02-20 MED ORDER — MIDAZOLAM HCL 2 MG/2ML IJ SOLN
INTRAMUSCULAR | Status: AC
  Filled 2017-02-20: qty 2

## 2017-02-20 MED ORDER — MIDAZOLAM HCL 2 MG/2ML IJ SOLN
INTRAMUSCULAR | Status: DC | PRN
  Administered 2017-02-20: 1 mg via INTRAVENOUS

## 2017-02-20 MED ORDER — INSULIN ASPART 100 UNIT/ML ~~LOC~~ SOLN
SUBCUTANEOUS | Status: AC
  Administered 2017-02-20: 8 [IU] via SUBCUTANEOUS
  Filled 2017-02-20: qty 1

## 2017-02-20 MED ORDER — HEPARIN (PORCINE) IN NACL 2-0.9 UNIT/ML-% IJ SOLN
INTRAMUSCULAR | Status: AC | PRN
  Administered 2017-02-20: 1000 mL

## 2017-02-20 MED ORDER — INSULIN ASPART 100 UNIT/ML ~~LOC~~ SOLN
SUBCUTANEOUS | Status: AC
  Filled 2017-02-20: qty 1

## 2017-02-20 MED ORDER — INSULIN ASPART 100 UNIT/ML ~~LOC~~ SOLN
0.0000 [IU] | Freq: Three times a day (TID) | SUBCUTANEOUS | Status: DC
  Administered 2017-02-20: 15 [IU] via SUBCUTANEOUS
  Administered 2017-02-20: 8 [IU] via SUBCUTANEOUS
  Filled 2017-02-20: qty 0.15

## 2017-02-20 MED ORDER — HEPARIN (PORCINE) IN NACL 2-0.9 UNIT/ML-% IJ SOLN
INTRAMUSCULAR | Status: DC | PRN
  Administered 2017-02-20: 08:00:00 via INTRA_ARTERIAL

## 2017-02-20 MED ORDER — SODIUM CHLORIDE 0.9 % WEIGHT BASED INFUSION: 1.0000 mL/kg/h | INTRAVENOUS | Status: DC

## 2017-02-20 MED ORDER — ONDANSETRON HCL 4 MG/2ML IJ SOLN: 4.0000 mg | Freq: Four times a day (QID) | INTRAMUSCULAR | Status: DC | PRN

## 2017-02-20 MED ORDER — HEPARIN SODIUM (PORCINE) 1000 UNIT/ML IJ SOLN
INTRAMUSCULAR | Status: AC
  Filled 2017-02-20: qty 1

## 2017-02-20 SURGICAL SUPPLY — 15 items
CATH BALLN WEDGE 5F 110CM (CATHETERS) ×2 IMPLANT
CATH INFINITI 5 FR JL3.5 (CATHETERS) ×2 IMPLANT
CATH INFINITI JR4 5F (CATHETERS) ×2 IMPLANT
COVER PRB 48X5XTLSCP FOLD TPE (BAG) ×1 IMPLANT
COVER PROBE 5X48 (BAG) ×1
DEVICE RAD COMP TR BAND LRG (VASCULAR PRODUCTS) ×2 IMPLANT
GLIDESHEATH SLEND A-KIT 6F 22G (SHEATH) ×2 IMPLANT
GUIDEWIRE INQWIRE 1.5J.035X260 (WIRE) ×1 IMPLANT
INQWIRE 1.5J .035X260CM (WIRE) ×2
KIT HEART LEFT (KITS) ×2 IMPLANT
PACK CARDIAC CATHETERIZATION (CUSTOM PROCEDURE TRAY) ×2 IMPLANT
SHEATH GLIDE SLENDER 4/5FR (SHEATH) ×2 IMPLANT
TRANSDUCER W/STOPCOCK (MISCELLANEOUS) ×2 IMPLANT
TUBING CIL FLEX 10 FLL-RA (TUBING) ×2 IMPLANT
WIRE HI TORQ VERSACORE-J 145CM (WIRE) ×2 IMPLANT

## 2017-02-20 NOTE — Interval H&P Note (Signed)
Cath Lab Visit (complete for each Cath Lab visit)  Clinical Evaluation Leading to the Procedure:   ACS: No.  Non-ACS:    Anginal Classification: CCS III  Anti-ischemic medical therapy: Minimal Therapy (1 class of medications)  Non-Invasive Test Results: No non-invasive testing performed  Prior CABG: No previous CABG      History and Physical Interval Note:  02/20/2017 7:07 AM  [name]  has presented today for surgery, with the diagnosis of CAD, SOB  The various methods of treatment have been discussed with the patient and family. After consideration of risks, benefits and other options for treatment, the patient has consented to  Procedure(s): RIGHT/LEFT HEART CATH AND CORONARY/GRAFT ANGIOGRAPHY (N/A) as a surgical intervention .  The patient's history has been reviewed, patient examined, no change in status, stable for surgery.  I have reviewed the patient's chart and labs.  Questions were answered to the patient's satisfaction.     Lyn RecordsHenry W Orena Cavazos III

## 2017-02-20 NOTE — Discharge Instructions (Signed)
Radial Site Care °Refer to this sheet in the next few weeks. These instructions provide you with information about caring for yourself after your procedure. Your health care provider may also give you more specific instructions. Your treatment has been planned according to current medical practices, but problems sometimes occur. Call your health care provider if you have any problems or questions after your procedure. °What can I expect after the procedure? °After your procedure, it is typical to have the following: °· Bruising at the radial site that usually fades within 1-2 weeks. °· Blood collecting in the tissue (hematoma) that may be painful to the touch. It should usually decrease in size and tenderness within 1-2 weeks. ° °Follow these instructions at home: °· Take medicines only as directed by your health care provider. °· You may shower 24-48 hours after the procedure or as directed by your health care provider. Remove the bandage (dressing) and gently wash the site with plain soap and water. Pat the area dry with a clean towel. Do not rub the site, because this may cause bleeding. °· Do not take baths, swim, or use a hot tub until your health care provider approves. °· Check your insertion site every day for redness, swelling, or drainage. °· Do not apply powder or lotion to the site. °· Do not flex or bend the affected arm for 24 hours or as directed by your health care provider. °· Do not push or pull heavy objects with the affected arm for 24 hours or as directed by your health care provider. °· Do not lift over 10 lb (4.5 kg) for 5 days after your procedure or as directed by your health care provider. °· Ask your health care provider when it is okay to: °? Return to work or school. °? Resume usual physical activities or sports. °? Resume sexual activity. °· Do not drive home if you are discharged the same day as the procedure. Have someone else drive you. °· You may drive 24 hours after the procedure  unless otherwise instructed by your health care provider. °· Do not operate machinery or power tools for 24 hours after the procedure. °· If your procedure was done as an outpatient procedure, which means that you went home the same day as your procedure, a responsible adult should be with you for the first 24 hours after you arrive home. °· Keep all follow-up visits as directed by your health care provider. This is important. °Contact a health care provider if: °· You have a fever. °· You have chills. °· You have increased bleeding from the radial site. Hold pressure on the site. °Get help right away if: °· You have unusual pain at the radial site. °· You have redness, warmth, or swelling at the radial site. °· You have drainage (other than a small amount of blood on the dressing) from the radial site. °· The radial site is bleeding, and the bleeding does not stop after 30 minutes of holding steady pressure on the site. °· Your arm or hand becomes pale, cool, tingly, or numb. °This information is not intended to replace advice given to you by your health care provider. Make sure you discuss any questions you have with your health care provider. °Document Released: 03/26/2010 Document Revised: 07/30/2015 Document Reviewed: 09/09/2013 °Elsevier Interactive Patient Education © 2018 Elsevier Inc. ° ° ° °Moderate Conscious Sedation, Adult, Care After °These instructions provide you with information about caring for yourself after your procedure. Your health care provider   may also give you more specific instructions. Your treatment has been planned according to current medical practices, but problems sometimes occur. Call your health care provider if you have any problems or questions after your procedure. °What can I expect after the procedure? °After your procedure, it is common: °· To feel sleepy for several hours. °· To feel clumsy and have poor balance for several hours. °· To have poor judgment for several  hours. °· To vomit if you eat too soon. ° °Follow these instructions at home: °For at least 24 hours after the procedure: ° °· Do not: °? Participate in activities where you could fall or become injured. °? Drive. °? Use heavy machinery. °? Drink alcohol. °? Take sleeping pills or medicines that cause drowsiness. °? Make important decisions or sign legal documents. °? Take care of children on your own. °· Rest. °Eating and drinking °· Follow the diet recommended by your health care provider. °· If you vomit: °? Drink water, juice, or soup when you can drink without vomiting. °? Make sure you have little or no nausea before eating solid foods. °General instructions °· Have a responsible adult stay with you until you are awake and alert. °· Take over-the-counter and prescription medicines only as told by your health care provider. °· If you smoke, do not smoke without supervision. °· Keep all follow-up visits as told by your health care provider. This is important. °Contact a health care provider if: °· You keep feeling nauseous or you keep vomiting. °· You feel light-headed. °· You develop a rash. °· You have a fever. °Get help right away if: °· You have trouble breathing. °This information is not intended to replace advice given to you by your health care provider. Make sure you discuss any questions you have with your health care provider. °Document Released: 12/12/2012 Document Revised: 07/27/2015 Document Reviewed: 06/13/2015 °Elsevier Interactive Patient Education © 2018 Elsevier Inc. ° °

## 2017-02-20 NOTE — Progress Notes (Signed)
Right brachial venous sheath removed and pressure held x 5min; no bleeding or hematoma 

## 2017-02-21 ENCOUNTER — Telehealth: Payer: Self-pay

## 2017-02-21 NOTE — Telephone Encounter (Signed)
Pt it aware and agreeable to normal results with worsening kidney function. Will fax records to PCP for review and to follow. Pt is agreeable

## 2017-02-23 ENCOUNTER — Telehealth: Payer: Self-pay | Admitting: Internal Medicine

## 2017-02-23 DIAGNOSIS — Z950 Presence of cardiac pacemaker: Secondary | ICD-10-CM

## 2017-02-23 DIAGNOSIS — I495 Sick sinus syndrome: Secondary | ICD-10-CM

## 2017-02-23 NOTE — Telephone Encounter (Signed)
Patient calling complaining of SOB that has worsened since her heart cath on 12/17. She states that she has SOB at rest and has been lightheaded. Patient denies having any chest pain, syncope,  LE swelling, weight gain, or any other symptoms. Patient does not have vitals to offer. Patient states that her cath site was fine and denies having any swelling, bleeding, temperature/color changes, or numbness or tingling. Cath showed normal pressures and stated that her "dyspnea and chest pain are not related to heart failure or significant coronary obstruction". Patient scheduled for echo on 03/08/17. Patient takes HCTZ 12.5 mg QD, however the patient has not taken it since her cath on 12/17. Instructed the patient to restart her HCTZ to see if her symptoms improve and that I would forward the information to Dr. Graciela HusbandsKlein for review. Instructed the patient to let us know if her symptoms worsen and that I would check on her tomorrow. Patient verbalized understanding.

## 2017-02-23 NOTE — Telephone Encounter (Signed)
New Message  Pt c/o Shortness Of Breath: STAT if SOB developed within the last 24 hours or pt is noticeably SOB on the phone  1. Are you currently SOB (can you hear that pt is SOB on the phone)? Yes   2. How long have you been experiencing SOB? Since procedure on Monday   3. Are you SOB when sitting or when up moving around?   4. Are you currently experiencing any other symptoms? Nausea and pre syncope

## 2017-02-24 MED ORDER — HYDROCHLOROTHIAZIDE 25 MG PO TABS: 12.5000 mg | ORAL_TABLET | Freq: Every day | ORAL | 11 refills | Status: AC

## 2017-02-24 NOTE — Telephone Encounter (Signed)
Called to check on patient to see if she was feeling better since restarting the HCTZ. Patient states that she is starting to feel better. Instructed the patient to continue HCTZ and all other meds as directed. Patient requesting refill. Sent to patient's preferred pharmacy. Patient thanked me for the call.

## 2017-03-08 ENCOUNTER — Other Ambulatory Visit (HOSPITAL_COMMUNITY): Payer: Medicare Other

## 2017-03-09 DIAGNOSIS — I129 Hypertensive chronic kidney disease with stage 1 through stage 4 chronic kidney disease, or unspecified chronic kidney disease: Secondary | ICD-10-CM | POA: Diagnosis not present

## 2017-03-09 DIAGNOSIS — F0390 Unspecified dementia without behavioral disturbance: Secondary | ICD-10-CM | POA: Diagnosis not present

## 2017-03-09 DIAGNOSIS — I2510 Atherosclerotic heart disease of native coronary artery without angina pectoris: Secondary | ICD-10-CM | POA: Diagnosis not present

## 2017-03-09 DIAGNOSIS — E7800 Pure hypercholesterolemia, unspecified: Secondary | ICD-10-CM | POA: Diagnosis not present

## 2017-03-09 DIAGNOSIS — E1165 Type 2 diabetes mellitus with hyperglycemia: Secondary | ICD-10-CM | POA: Diagnosis not present

## 2017-03-24 LAB — CUP PACEART INCLINIC DEVICE CHECK
Battery Impedance: 1992 Ohm
Brady Statistic AP VS Percent: 0 %
Brady Statistic AS VP Percent: 53 %
Brady Statistic AS VS Percent: 0 %
Date Time Interrogation Session: 20181203153728
Implantable Lead Implant Date: 20081113
Implantable Lead Model: 5076
Lead Channel Impedance Value: 553 Ohm
Lead Channel Impedance Value: 708 Ohm
Lead Channel Pacing Threshold Amplitude: 0.5 V
Lead Channel Pacing Threshold Pulse Width: 0.4 ms
Lead Channel Pacing Threshold Pulse Width: 0.4 ms
Lead Channel Setting Sensing Sensitivity: 4 mV
MDC IDC LEAD IMPLANT DT: 20081113
MDC IDC LEAD LOCATION: 753859
MDC IDC LEAD LOCATION: 753860
MDC IDC MSMT BATTERY REMAINING LONGEVITY: 24 mo
MDC IDC MSMT BATTERY VOLTAGE: 2.7 V
MDC IDC MSMT LEADCHNL RA SENSING INTR AMPL: 4 mV
MDC IDC MSMT LEADCHNL RV PACING THRESHOLD AMPLITUDE: 0.5 V
MDC IDC PG IMPLANT DT: 20081113
MDC IDC SET LEADCHNL RA PACING AMPLITUDE: 2 V
MDC IDC SET LEADCHNL RV PACING AMPLITUDE: 2.5 V
MDC IDC SET LEADCHNL RV PACING PULSEWIDTH: 0.4 ms
MDC IDC STAT BRADY AP VP PERCENT: 47 %

## 2017-03-30 ENCOUNTER — Ambulatory Visit (INDEPENDENT_AMBULATORY_CARE_PROVIDER_SITE_OTHER): Payer: Medicare Other | Admitting: *Deleted

## 2017-03-30 ENCOUNTER — Telehealth: Payer: Self-pay | Admitting: Cardiology

## 2017-03-30 DIAGNOSIS — I495 Sick sinus syndrome: Secondary | ICD-10-CM

## 2017-03-30 NOTE — Telephone Encounter (Signed)
Spoke with pt and reminded pt of remote transmission that is due today. Pt verbalized understanding.   

## 2017-03-30 NOTE — Progress Notes (Signed)
Remote pacemaker transmission.   

## 2017-03-31 ENCOUNTER — Encounter: Payer: Self-pay | Admitting: Cardiology

## 2017-04-13 ENCOUNTER — Ambulatory Visit (HOSPITAL_COMMUNITY)
Admission: RE | Admit: 2017-04-13 | Discharge: 2017-04-13 | Disposition: A | Discharge status: Home / Self Care | Payer: Medicare Other | Source: Ambulatory Visit | Attending: Family | Admitting: Family

## 2017-04-13 ENCOUNTER — Ambulatory Visit (INDEPENDENT_AMBULATORY_CARE_PROVIDER_SITE_OTHER): Payer: Medicare Other | Admitting: Family

## 2017-04-13 ENCOUNTER — Encounter: Payer: Self-pay | Admitting: Family

## 2017-04-13 VITALS — BP 176/96 | HR 71 | Temp 97.0°F | Resp 20 | Ht 63.0 in | Wt 146.7 lb

## 2017-04-13 DIAGNOSIS — I6529 Occlusion and stenosis of unspecified carotid artery: Secondary | ICD-10-CM

## 2017-04-13 DIAGNOSIS — I6523 Occlusion and stenosis of bilateral carotid arteries: Secondary | ICD-10-CM | POA: Diagnosis not present

## 2017-04-13 DIAGNOSIS — Z9889 Other specified postprocedural states: Secondary | ICD-10-CM

## 2017-04-13 LAB — VAS US CAROTID
LCCAPDIAS: 13 cm/s
LEFT ECA DIAS: -50 cm/s
LEFT VERTEBRAL DIAS: 11 cm/s
LICAPSYS: -83 cm/s
Left CCA dist dias: 19 cm/s
Left CCA dist sys: 72 cm/s
Left CCA prox sys: 68 cm/s
Left ICA dist dias: -31 cm/s
Left ICA dist sys: -130 cm/s
Left ICA prox dias: -19 cm/s
RCCADSYS: -121 cm/s
RCCAPDIAS: 11 cm/s
RCCAPSYS: 66 cm/s
RIGHT CCA MID DIAS: 19 cm/s
RIGHT ECA DIAS: -13 cm/s
RIGHT VERTEBRAL DIAS: -9 cm/s

## 2017-04-13 NOTE — Progress Notes (Signed)
Chief Complaint: Follow up Extracranial Carotid Artery Stenosis   History of Present Illness  JOHNNA [name] is a 72 y.o. female patient of Dr. Lear Ng who is back for follow-up. She is status post left carotid endarterectomy with patch angioplasty on 10/31/2014. This was done for asymptomatic left carotid stenosis. Intraoperative findings included a isolated focal ulcerated lesion at the carotid bifurcation. She did have the internal and external carotid artery rotated 180 apart. Her postoperative course was uncomplicated.  She reports a history of "brain bleed" about 2002 as manifested by dizziness, left eye became "droopy", does not think she had vision changes or speech difficulties at that time.  She denies any stroke or TIA sx's since then.  Dr. Myra Gianotti last evaluated pt on 02-01-16. At that time based on the patient's most recent ultrasound in June 2017, she had bilateral patent carotid arteries and patent vertebral and subclavian arteries.  She's been having symptoms for approximately 5 months.  In order to definitively rule out a vascular etiology, Dr. Gaylord Shih thought additional imaging was warranted both of the neck and the intracranial vasculature. Therefore he ordered a CT angiogram of the head and neck. Dr. Myra Gianotti called pt with results.   She had a heart cath by Dr. Katrinka Blazing in December 2018.   Pt Diabetic: yes, states her last A1C was 6.4, she has not been to see her PCP since the heart cath, will see her PCP on 04-19-17 Pt smoker: former smoker, quit in 2006, started smoking at age 38  Pt meds include: Statin : yes ASA: yes Other anticoagulants/antiplatelets: no   Past Medical History:  Diagnosis Date  . Allergic rhinitis, cause unspecified   . Anxiety state, unspecified   . Arthritis   . Atrial fibrillation (HCC)   . Bronchitis, not specified as acute or chronic   . Cardiac pacemaker medtronic   . 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 Depressive disorder, not elsewhere classified   . Disorders of sacrum   . GERD (gastroesophageal reflux disease)   . Headache(784.0)    history of  . Heart murmur   . History of hiatal hernia   . Hypertension   . LBBB (left bundle branch block)   . Myalgia and myositis, unspecified   . Obstructive sleep apnea (adult) (pediatric)   . Other voice and resonance disorders   . Pain in joint, lower leg    bilateral  . Presence of permanent cardiac pacemaker   . Pulmonary embolism (HCC)   . Rash, skin 2 weeks per patient   right arm, inner aspect    . Rheumatic fever in pediatric patient   . Total knee replacement status   . Type II or unspecified type diabetes mellitus without mention of complication, not stated as uncontrolled    Type II  . Unspecified asthma(493.90)   . 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 Vertigo    Meclizine (Antivert)    Social History Social History   Tobacco Use  . Smoking status: Former Smoker    Packs/day: 0.30    Years: 20.00    Pack years: 6.00    Types: Cigarettes    Last attempt to quit: 2007    Years since quitting: 12.1  . Smokeless tobacco: Never Used  . Tobacco comment: pt smoked less than 1/4 ppd  Substance Use Topics  . Alcohol use: No    Alcohol/week: 0.0 oz  .  Drug use: No    Comment: "now, marijuana I would do..." - but doesn't by report    Family History Family History  Problem Relation Age of Onset  . Cancer Father        LUNG  . Alcohol abuse Father   . Diabetes Mother   . Heart disease Mother   . Stroke Mother   . Heart attack Mother   . Hypertension Mother   . Deep vein thrombosis Mother   . Hyperlipidemia Mother   . Varicose Veins Mother   . Peripheral vascular disease Mother        amputation  . Lupus Sister   . Diabetes Sister   . Aneurysm Sister   . Deep vein thrombosis Sister   . Hyperlipidemia Sister   . Hypertension Sister   . Varicose  Veins Sister   . Heart disease Sister   . Deep vein thrombosis Sister   . Diabetes Sister   . Hyperlipidemia Sister   . Hypertension Sister   . Varicose Veins Sister     Surgical History Past Surgical History:  Procedure Laterality Date  . ABDOMINAL HYSTERECTOMY    . ANTERIOR CERVICAL DECOMP/DISCECTOMY FUSION N/A 04/27/2016   Procedure: C5-6, C6-7 Anterior Cervical Discectomy and Fusion, Allograft, Plate;  Surgeon: Eldred Manges, MD;  Location: [facility] OR;  Service: Orthopedics;  Laterality: N/A;  . BALLOON DILATION N/A 06/26/2013   Procedure: Marvis Repress DILATION;  Surgeon: Vertell Novak., MD;  Location: [facility];  Service: Endoscopy;  Laterality: N/A;  . BILATERAL SALPINGOOPHORECTOMY  1991  . CARDIAC CATHETERIZATION  02/2018  . CHOLECYSTECTOMY    . COLONOSCOPY N/A 06/26/2013   Procedure: COLONOSCOPY;  Surgeon: Vertell Novak., MD;  Location: [facility];  Service: Endoscopy;  Laterality: N/A;  . ELBOW SURGERY Bilateral   . ENDARTERECTOMY Left 10/31/2014   Procedure: LEFT CAROTID ENDARTERECTOMY WITH BOVINE PERICARDIAL PATCH ANGIOPLASTY;  Surgeon: Nada Libman, MD;  Location: [facility] OR;  Service: Vascular;  Laterality: Left;  . ESOPHAGOGASTRODUODENOSCOPY  11/02/2011   Procedure: ESOPHAGOGASTRODUODENOSCOPY (EGD);  Surgeon: Vertell Novak., MD;  Location: [facility];  Service: Endoscopy;  Laterality: N/A;  with xray  . ESOPHAGOGASTRODUODENOSCOPY N/A 06/26/2013   Procedure: ESOPHAGOGASTRODUODENOSCOPY (EGD);  Surgeon: Vertell Novak., MD;  Location: [facility];  Service: Endoscopy;  Laterality: N/A;  . INSERT / REPLACE / REMOVE PACEMAKER     insertion 2007  . NASAL FRACTURE SURGERY    . RIGHT/LEFT HEART CATH AND CORONARY ANGIOGRAPHY N/A 02/20/2017   Procedure: RIGHT/LEFT HEART CATH AND CORONARY ANGIOGRAPHY;  Surgeon: Lyn Records, MD;  Location: [facility];  Service: Cardiovascular;  Laterality: N/A;  . SAVORY DILATION  11/02/2011   Procedure: SAVORY DILATION;   Surgeon: Vertell Novak., MD;  Location: [facility];  Service: Endoscopy;  Laterality: N/A;  . TEMPOROMANDIBULAR JOINT ARTHROPLASTY  1982  . TOTAL KNEE ARTHROPLASTY  2001, 2010, 2012   right  . total knee repair  2010   right  . VESICOVAGINAL FISTULA CLOSURE W/ TAH  1974    Allergies  Allergen Reactions  . Cephalexin Swelling and Other (See Comments)    Tongue and lip swelling  . Bactrim [Sulfamethoxazole-Trimethoprim] Nausea And Vomiting  . Penicillins Nausea And Vomiting, Swelling and Other (See Comments)    Oral Cephalosporins cause Tongue and Lip Swelling Has patient had a PCN reaction causing immediate rash, facial/tongue/throat swelling, SOB or lightheadedness with hypotension: Yes Has patient had a PCN reaction causing severe  rash involving mucus membranes or skin necrosis: No Has patient had a PCN reaction that required hospitalization: No Has patient had a PCN reaction occurring within the last 10 years: Yes If all of the above answers are "NO", then may proceed with Cephalosporin use.   . Atenolol Other (See Comments)    Light headed/hair loss  . Cephalosporins Swelling    Tongue and lips swell  . Chromic Sulfate Swelling and Other (See Comments)    Chromic sutures--arm swells  . Codeine Nausea Only  . Desmopressin Other (See Comments)    Patient is unsure of reaction  . Dexilant [Dexlansoprazole] Diarrhea  . Doxycycline Nausea And Vomiting  . Entex Other (See Comments)    Reaction not recalled  . Marcelline Deist [Dapagliflozin] Other (See Comments)    Patient is unsure of reaction  . Gabapentin Other (See Comments)    Reaction not recalled  . Levaquin [Levofloxacin] Other (See Comments)    GI upset  . Lexapro [Escitalopram] Other (See Comments)    Weight gain  . Lisinopril Other (See Comments)    Unsure of reaction  . Losartan Potassium Other (See Comments)    Unknown reaction  . Metformin Other (See Comments)    Reaction not recalled  . Other Other (See  Comments)    "Bellaryl"- Unknown reaction  . Pregabalin Other (See Comments)    Reaction not recalled  . Rosiglitazone Maleate Other (See Comments)    Reaction not recalled  . Simvastatin Other (See Comments)    Muscle aches.  . Sitagliptin Other (See Comments)    Muscle aches  . Tradjenta [Linagliptin] Other (See Comments)    Joint pain  . Contrast Media [Iodinated Diagnostic Agents] Swelling, Rash and Other (See Comments)    Lips felt like they were on fire, rash, itching, lip peeling    Current Outpatient Medications  Medication Sig Dispense Refill  . aspirin EC 81 MG tablet Take 81 mg by mouth daily.    Marland Kitchen atorvastatin (LIPITOR) 20 MG tablet Take 20 mg by mouth daily.      . cetirizine (ZYRTEC) 10 MG tablet Take 10 mg by mouth daily.    . cycloSPORINE (RESTASIS) 0.05 % ophthalmic emulsion Place 1 drop into both eyes daily.     Marland Kitchen diltiazem (CARDIZEM CD) 120 MG 24 hr capsule Take 1 capsule (120 mg total) by mouth daily. 90 capsule 4  . EPINEPHrine 0.3 mg/0.3 mL IJ SOAJ injection Inject 0.3 mg into the muscle once as needed (FOR ALLERGIES OR ALLERGIC REACTION).     Marland Kitchen HUMULIN N KWIKPEN 100 UNIT/ML Kiwkpen Inject 10 Units into the skin 2 (two) times daily. (Patient taking differently: Inject 20-40 Units into the skin See admin instructions. 20 units in the morning before breakfast and 40 units before supper) 15 mL 11  . hydrochlorothiazide (HYDRODIURIL) 25 MG tablet Take 0.5 tablets (12.5 mg total) by mouth daily. 45 tablet 11  . HYDROcodone-acetaminophen (NORCO) 5-325 MG tablet Take 1-2 tablets by mouth every 6 (six) hours as needed for moderate pain. 40 tablet 0  . MYRBETRIQ 50 MG TB24 tablet Take 50 mg by mouth daily.  3  . nebivolol (BYSTOLIC) 10 MG tablet TAKE 1 TABLET BY MOUTH TWICE A DAY. (Patient taking differently: Take 10 mg by mouth 2 (two) times daily. ) 180 tablet 3  . Omega-3 Fatty Acids (FISH OIL) 1200 MG CAPS Take 2,400 mg by mouth daily.     . polyethylene glycol powder  (GLYCOLAX/MIRALAX) powder Take 17 g  by mouth daily as needed for moderate constipation.     . predniSONE (DELTASONE) 50 MG tablet Take 1 tablet (50mg  total) by mouth 13 hours, 7 hours and 1 hour prior to procedure.  Take medication as directed. 3 tablet 0   No current facility-administered medications for this visit.     Review of Systems : See HPI for pertinent positives and negatives.  Physical Examination  Vitals:   04/13/17 1445 04/13/17 1453  BP: (!) 170/86 (!) 176/96  Pulse: 71   Resp: 20   Temp: (!) 97 F (36.1 C)   TempSrc: Oral   SpO2: 98%   Weight: 146 lb 11.2 oz (66.5 kg)   Height: 5\' 3"  (1.6 m)    Body mass index is 25.99 kg/m.  General:WDWN female in NAD HENT: No gross abnormalities  GAIT: normal Eyes: PERRLA Pulmonary:  Respirations are non-labored, CTAB Cardiac: regular rhythm, no detected murmur, pacemaker left upper chest (subcutaneous).  VASCULAR EXAM Carotid Bruits Right Left   Negative positive    Aorta is not palpable. Radial pulses are 2+ palpable and equal.                                                                                                                                          LE Pulses Right Left       POPLITEAL  not palpable   not palpable       POSTERIOR TIBIAL  not palpable   not palpable        DORSALIS PEDIS      ANTERIOR TIBIAL  palpable   palpable     Gastrointestinal: soft, nontender, BS WNL, no r/g,  no palpable masses. Musculoskeletal: no muscle atrophy/wasting. M/S 5/5 throughout except 4/5 in upper extremities, extremities without ischemic changes. Skin: No rash, no cellulitis, no ulcers noted.  Neurologic: A&O X 3; appropriate affect, speech is normal, CN 2-12 intact except left facial droop with smile, tongue protrudes to the right, pain and light touch intact in extremities, Motor exam as listed above Psychiatric: Normal thought content, mood appropriate to clinical situation.        Assessment: [name] is a 72 y.o. female who is status post left carotid endarterectomy with patch angioplasty on 10/31/2014. This was done for asymptomatic left carotid stenosis. Intraoperative findings included a isolated focal ulcerated lesion at the carotid bifurcation. She reports a history of "brain bleed" about 2002 as manifested by dizziness, left eye became "droopy", does not think she had vision changes or speech difficulties at that time.  She denies any stroke or TIA sx's since then.   Her blood pressure is elevated, she has an appointment to see her PCP on 04-19-17.   Her atherosclerotic risk factors include currently well controlled DM and former smoker.   DATA   Carotid Duplex (04/13/17): Right ICA: 1-39% stenosis Left ICA: No restenosis at the  CEA site Left ECA with >50% stenosis  Bilateral vertebral artery flow is antegrade.  Bilateral subclavian artery waveforms are normal.  Less stenosis in the right ICA compared to neck CTA on 02-03-16.   CTA head and neck (02-03-16): IMPRESSION: 1. Unremarkable appearance of the intracranial arterial vasculature. 2. Focal calcified plaque at the right carotid bifurcation with 60% proximal ICA stenosis. 3. Widely patent vertebral arteries. 4. No acute intracranial abnormality or mass. 5. Mildly progressive cervical disc degeneration, most advanced at C5-6 and C6-7. Mild spinal and mild left foraminal stenosis at C5-6. 6. New mild left foraminal stenosis at C4-5.  CT head w/o contrast requested by Dr. Patria Mane (05-20-16): IMPRESSION: 1.  No evidence of acute intracranial abnormality. 2. Generalized cerebral volume loss and mild-to-moderate chronic small vessel ischemia.    Carotid Duplex (08/17/2015):  Right ICA: 40 - 59 % stenosis. Left ICA: CEA site with no restenosis.   Plan: Follow-up in 6 months with Carotid Duplex scan.  I discussed in depth with the patient the nature of atherosclerosis, and  emphasized the importance of maximal medical management including strict control of blood pressure, blood glucose, and lipid levels, obtaining regular exercise, and continued cessation of smoking.  The patient is aware that without maximal medical management the underlying atherosclerotic disease process will progress, limiting the benefit of any interventions. The patient was given information about stroke prevention and what symptoms should prompt the patient to seek immediate medical care. Thank you for allowing [place] to participate in this patient's care.  Charisse March, RN, MSN, FNP-C [facility] Office: [phone]  Clinic Physician: Darrick Penna  04/13/17 2:58 PM

## 2017-04-13 NOTE — Patient Instructions (Signed)

## 2017-04-18 NOTE — Addendum Note (Signed)
Addended by: Burton ApleyPETTY, Alvy Alsop A on: 04/18/2017 11:14 AM   Modules accepted: Orders

## 2017-04-19 DIAGNOSIS — I129 Hypertensive chronic kidney disease with stage 1 through stage 4 chronic kidney disease, or unspecified chronic kidney disease: Secondary | ICD-10-CM | POA: Diagnosis not present

## 2017-04-19 DIAGNOSIS — E7800 Pure hypercholesterolemia, unspecified: Secondary | ICD-10-CM | POA: Diagnosis not present

## 2017-04-19 DIAGNOSIS — E1165 Type 2 diabetes mellitus with hyperglycemia: Secondary | ICD-10-CM | POA: Diagnosis not present

## 2017-04-19 DIAGNOSIS — I10 Essential (primary) hypertension: Secondary | ICD-10-CM | POA: Diagnosis not present

## 2017-04-19 LAB — CUP PACEART REMOTE DEVICE CHECK
Battery Impedance: 2177 Ohm
Battery Voltage: 2.71 V
Brady Statistic AP VP Percent: 75 %
Brady Statistic AS VS Percent: 0 %
Date Time Interrogation Session: 20190124180040
Implantable Lead Implant Date: 20081113
Implantable Lead Location: 753860
Implantable Lead Model: 5076
Lead Channel Impedance Value: 554 Ohm
Lead Channel Impedance Value: 716 Ohm
Lead Channel Pacing Threshold Amplitude: 0.625 V
Lead Channel Pacing Threshold Pulse Width: 0.4 ms
Lead Channel Pacing Threshold Pulse Width: 0.4 ms
Lead Channel Setting Pacing Amplitude: 2.5 V
Lead Channel Setting Pacing Pulse Width: 0.4 ms
Lead Channel Setting Sensing Sensitivity: 4 mV
MDC IDC LEAD IMPLANT DT: 20081113
MDC IDC LEAD LOCATION: 753859
MDC IDC MSMT BATTERY REMAINING LONGEVITY: 22 mo
MDC IDC MSMT LEADCHNL RA PACING THRESHOLD AMPLITUDE: 0.625 V
MDC IDC PG IMPLANT DT: 20081113
MDC IDC SET LEADCHNL RA PACING AMPLITUDE: 2 V
MDC IDC STAT BRADY AP VS PERCENT: 0 %
MDC IDC STAT BRADY AS VP PERCENT: 25 %

## 2017-05-02 ENCOUNTER — Ambulatory Visit: Payer: Medicare Other | Admitting: Neurology

## 2017-05-18 ENCOUNTER — Other Ambulatory Visit: Payer: Self-pay

## 2017-05-18 ENCOUNTER — Ambulatory Visit (HOSPITAL_COMMUNITY): Payer: Medicare Other | Attending: Cardiovascular Disease

## 2017-05-18 DIAGNOSIS — I447 Left bundle-branch block, unspecified: Secondary | ICD-10-CM | POA: Insufficient documentation

## 2017-05-18 DIAGNOSIS — Z86711 Personal history of pulmonary embolism: Secondary | ICD-10-CM | POA: Insufficient documentation

## 2017-05-18 DIAGNOSIS — R011 Cardiac murmur, unspecified: Secondary | ICD-10-CM | POA: Diagnosis not present

## 2017-05-18 DIAGNOSIS — G4733 Obstructive sleep apnea (adult) (pediatric): Secondary | ICD-10-CM | POA: Diagnosis not present

## 2017-05-18 DIAGNOSIS — I10 Essential (primary) hypertension: Secondary | ICD-10-CM | POA: Diagnosis not present

## 2017-05-18 DIAGNOSIS — I071 Rheumatic tricuspid insufficiency: Secondary | ICD-10-CM | POA: Diagnosis not present

## 2017-05-18 DIAGNOSIS — I4891 Unspecified atrial fibrillation: Secondary | ICD-10-CM | POA: Insufficient documentation

## 2017-05-29 DIAGNOSIS — E7800 Pure hypercholesterolemia, unspecified: Secondary | ICD-10-CM | POA: Diagnosis not present

## 2017-05-29 DIAGNOSIS — I10 Essential (primary) hypertension: Secondary | ICD-10-CM | POA: Diagnosis not present

## 2017-05-29 DIAGNOSIS — E1165 Type 2 diabetes mellitus with hyperglycemia: Secondary | ICD-10-CM | POA: Diagnosis not present

## 2017-06-12 ENCOUNTER — Other Ambulatory Visit: Payer: Self-pay | Admitting: Physician Assistant

## 2017-06-29 ENCOUNTER — Encounter: Payer: Medicare Other | Admitting: *Deleted

## 2017-06-30 ENCOUNTER — Encounter: Payer: Self-pay | Admitting: Cardiology

## 2017-07-04 ENCOUNTER — Telehealth: Payer: Self-pay | Admitting: Interventional Cardiology

## 2017-07-04 ENCOUNTER — Other Ambulatory Visit: Payer: Self-pay | Admitting: *Deleted

## 2017-07-04 MED ORDER — NEBIVOLOL HCL 10 MG PO TABS: ORAL_TABLET | ORAL | 1 refills | Status: AC

## 2017-07-04 NOTE — Telephone Encounter (Signed)
Ok to refill until Dec since she seen Dr. Graciela Husbands in Dec.  Thanks!

## 2017-07-04 NOTE — Telephone Encounter (Signed)
Will send to Dr. Katrinka Blazing and Dr. Graciela Husbands to see if they know of any cardiologists in the [facility] area.

## 2017-07-04 NOTE — Telephone Encounter (Signed)
New Message:      Pt is calling to tell us she is currently in the process of moving to [facility] and needs help finding a cardiologist in that area.

## 2017-07-04 NOTE — Telephone Encounter (Signed)
If okay, how long can this refill be approved for? Please advise. Thanks, MI

## 2017-07-04 NOTE — Telephone Encounter (Signed)
New Message:      *STAT* If patient is at the pharmacy, call can be transferred to refill team.   1. Which medications need to be refilled? (please list name of each medication and dose if known) nebivolol (BYSTOLIC) 10 MG tablet  2. Which pharmacy/location (including street and city if local pharmacy) is medication to be sent to?[id number] - [place], [place] - [address]  3. Do they need a 30 day or 90 day supply? Pt is asking for enough of this medication until she can find a provider in [address].

## 2017-07-06 ENCOUNTER — Ambulatory Visit (INDEPENDENT_AMBULATORY_CARE_PROVIDER_SITE_OTHER): Payer: Medicare Other | Admitting: *Deleted

## 2017-07-06 DIAGNOSIS — I495 Sick sinus syndrome: Secondary | ICD-10-CM

## 2017-07-07 NOTE — Telephone Encounter (Signed)
Establish with PCP then get referral from PCP.

## 2017-07-10 NOTE — Progress Notes (Signed)
Remote pacemaker transmission.   

## 2017-07-11 ENCOUNTER — Encounter: Payer: Self-pay | Admitting: Cardiology

## 2017-07-12 NOTE — Telephone Encounter (Signed)
Spoke with pt and made her aware of Dr. Michaelle Copas recommendation.  Advised if I hear back from Dr. Graciela Husbands and he knows someone I will let her know.  Pt appreciative for call.

## 2017-07-18 LAB — CUP PACEART REMOTE DEVICE CHECK
Battery Remaining Longevity: 20 mo
Brady Statistic AP VP Percent: 79 %
Brady Statistic AP VS Percent: 0 %
Brady Statistic AS VP Percent: 21 %
Brady Statistic AS VS Percent: 0 %
Implantable Lead Implant Date: 20081113
Implantable Lead Implant Date: 20081113
Implantable Lead Location: 753859
Implantable Lead Model: 5076
Lead Channel Impedance Value: 551 Ohm
Lead Channel Impedance Value: 687 Ohm
Lead Channel Pacing Threshold Amplitude: 0.5 V
Lead Channel Pacing Threshold Amplitude: 0.625 V
Lead Channel Pacing Threshold Pulse Width: 0.4 ms
Lead Channel Pacing Threshold Pulse Width: 0.4 ms
Lead Channel Setting Pacing Amplitude: 2 V
MDC IDC LEAD LOCATION: 753860
MDC IDC MSMT BATTERY IMPEDANCE: 2323 Ohm
MDC IDC MSMT BATTERY VOLTAGE: 2.7 V
MDC IDC PG IMPLANT DT: 20081113
MDC IDC SESS DTM: 20190502205459
MDC IDC SET LEADCHNL RV PACING AMPLITUDE: 2.5 V
MDC IDC SET LEADCHNL RV PACING PULSEWIDTH: 0.4 ms
MDC IDC SET LEADCHNL RV SENSING SENSITIVITY: 4 mV

## 2017-08-07 DIAGNOSIS — I10 Essential (primary) hypertension: Secondary | ICD-10-CM | POA: Diagnosis not present

## 2017-08-07 DIAGNOSIS — E1159 Type 2 diabetes mellitus with other circulatory complications: Secondary | ICD-10-CM | POA: Diagnosis not present

## 2017-08-07 DIAGNOSIS — R5383 Other fatigue: Secondary | ICD-10-CM | POA: Diagnosis not present

## 2017-08-07 DIAGNOSIS — N390 Urinary tract infection, site not specified: Secondary | ICD-10-CM | POA: Diagnosis not present

## 2017-08-07 DIAGNOSIS — Z6825 Body mass index (BMI) 25.0-25.9, adult: Secondary | ICD-10-CM | POA: Diagnosis not present

## 2017-08-07 DIAGNOSIS — R110 Nausea: Secondary | ICD-10-CM | POA: Diagnosis not present

## 2017-08-09 DIAGNOSIS — I10 Essential (primary) hypertension: Secondary | ICD-10-CM | POA: Diagnosis not present

## 2017-08-09 DIAGNOSIS — K5730 Diverticulosis of large intestine without perforation or abscess without bleeding: Secondary | ICD-10-CM | POA: Diagnosis not present

## 2017-08-09 DIAGNOSIS — R4182 Altered mental status, unspecified: Secondary | ICD-10-CM | POA: Diagnosis not present

## 2017-08-09 DIAGNOSIS — R109 Unspecified abdominal pain: Secondary | ICD-10-CM | POA: Diagnosis not present

## 2017-08-09 DIAGNOSIS — R1030 Lower abdominal pain, unspecified: Secondary | ICD-10-CM | POA: Diagnosis not present

## 2017-08-09 DIAGNOSIS — Z880 Allergy status to penicillin: Secondary | ICD-10-CM | POA: Diagnosis not present

## 2017-08-09 DIAGNOSIS — K5900 Constipation, unspecified: Secondary | ICD-10-CM | POA: Diagnosis not present

## 2017-08-09 DIAGNOSIS — E119 Type 2 diabetes mellitus without complications: Secondary | ICD-10-CM | POA: Diagnosis not present

## 2017-08-09 DIAGNOSIS — Z881 Allergy status to other antibiotic agents status: Secondary | ICD-10-CM | POA: Diagnosis not present

## 2017-08-09 DIAGNOSIS — R45851 Suicidal ideations: Secondary | ICD-10-CM | POA: Diagnosis not present

## 2017-08-09 DIAGNOSIS — R0989 Other specified symptoms and signs involving the circulatory and respiratory systems: Secondary | ICD-10-CM | POA: Diagnosis not present

## 2017-08-10 DIAGNOSIS — R45851 Suicidal ideations: Secondary | ICD-10-CM | POA: Diagnosis not present

## 2017-08-10 DIAGNOSIS — M1990 Unspecified osteoarthritis, unspecified site: Secondary | ICD-10-CM | POA: Diagnosis present

## 2017-08-10 DIAGNOSIS — F339 Major depressive disorder, recurrent, unspecified: Secondary | ICD-10-CM | POA: Diagnosis present

## 2017-08-10 DIAGNOSIS — E119 Type 2 diabetes mellitus without complications: Secondary | ICD-10-CM | POA: Diagnosis present

## 2017-08-10 DIAGNOSIS — I10 Essential (primary) hypertension: Secondary | ICD-10-CM | POA: Diagnosis present

## 2017-08-10 DIAGNOSIS — G8929 Other chronic pain: Secondary | ICD-10-CM | POA: Diagnosis present

## 2017-08-10 DIAGNOSIS — F69 Unspecified disorder of adult personality and behavior: Secondary | ICD-10-CM | POA: Diagnosis not present

## 2017-08-10 DIAGNOSIS — R109 Unspecified abdominal pain: Secondary | ICD-10-CM | POA: Diagnosis not present

## 2017-08-10 DIAGNOSIS — Z794 Long term (current) use of insulin: Secondary | ICD-10-CM | POA: Diagnosis not present

## 2017-08-10 DIAGNOSIS — F411 Generalized anxiety disorder: Secondary | ICD-10-CM | POA: Diagnosis present

## 2017-08-10 DIAGNOSIS — R1030 Lower abdominal pain, unspecified: Secondary | ICD-10-CM | POA: Diagnosis not present

## 2017-08-10 DIAGNOSIS — F0390 Unspecified dementia without behavioral disturbance: Secondary | ICD-10-CM | POA: Diagnosis present

## 2017-08-10 DIAGNOSIS — Z9114 Patient's other noncompliance with medication regimen: Secondary | ICD-10-CM | POA: Diagnosis not present

## 2017-08-10 DIAGNOSIS — K5900 Constipation, unspecified: Secondary | ICD-10-CM | POA: Diagnosis present

## 2017-08-10 DIAGNOSIS — R4182 Altered mental status, unspecified: Secondary | ICD-10-CM | POA: Diagnosis not present

## 2017-08-10 DIAGNOSIS — F05 Delirium due to known physiological condition: Secondary | ICD-10-CM | POA: Diagnosis not present

## 2017-08-10 DIAGNOSIS — K219 Gastro-esophageal reflux disease without esophagitis: Secondary | ICD-10-CM | POA: Diagnosis present

## 2017-08-17 DIAGNOSIS — F419 Anxiety disorder, unspecified: Secondary | ICD-10-CM | POA: Diagnosis not present

## 2017-08-17 DIAGNOSIS — E119 Type 2 diabetes mellitus without complications: Secondary | ICD-10-CM | POA: Diagnosis not present

## 2017-08-17 DIAGNOSIS — Z09 Encounter for follow-up examination after completed treatment for conditions other than malignant neoplasm: Secondary | ICD-10-CM | POA: Diagnosis not present

## 2017-08-17 DIAGNOSIS — R45851 Suicidal ideations: Secondary | ICD-10-CM | POA: Diagnosis not present

## 2017-08-17 DIAGNOSIS — Z6825 Body mass index (BMI) 25.0-25.9, adult: Secondary | ICD-10-CM | POA: Diagnosis not present

## 2017-09-11 DIAGNOSIS — F339 Major depressive disorder, recurrent, unspecified: Secondary | ICD-10-CM | POA: Diagnosis not present

## 2017-09-11 DIAGNOSIS — F419 Anxiety disorder, unspecified: Secondary | ICD-10-CM | POA: Diagnosis not present

## 2017-09-11 DIAGNOSIS — F0390 Unspecified dementia without behavioral disturbance: Secondary | ICD-10-CM | POA: Diagnosis not present

## 2017-09-13 IMAGING — CT CT ANGIO NECK
1 of 14 series · 3 of 33 positions shown · IV contrast (isovue)
Comparison: Head CT 10/21/2015. Cervical spine radiographs
06/15/2015. Cervical spine CT myelogram 08/31/2006.

CLINICAL DATA: Carotid artery stenosis. Neck pain with bilateral
upper extremity radiculopathy. Dizziness. Left parietal headache.

EXAM:
CT ANGIOGRAPHY HEAD AND NECK
CT CERVICAL SPINE WITHOUT CONTRAST
TECHNIQUE: Multidetector CT imaging of the head and neck was performed using
the standard protocol during bolus administration of intravenous
contrast. Multiplanar CT image reconstructions and MIPs were
obtained to evaluate the vascular anatomy. Carotid stenosis
measurements (when applicable) are obtained utilizing NASCET
criteria, using the distal internal carotid diameter as the
denominator.
Multidetector CT imaging of the cervical spine was performed
following the standard protocol without intravenous contrast.
Multiplanar CT image reconstructions of the cervical spine were also
generated.
CONTRAST:  50 mL Isovue 370

[Series 12: cta neck axial · axial · 0.36mm/px · z∈[-247,-80]mm · 3 of 335 slices shown]
[im 84/335  soft-tissue]
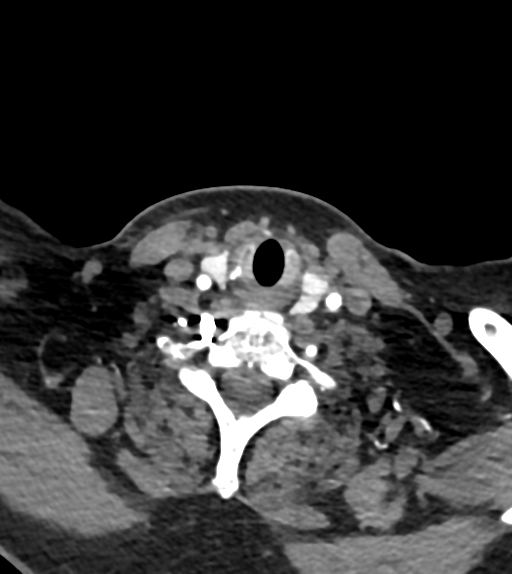
[im 168/335  bone]
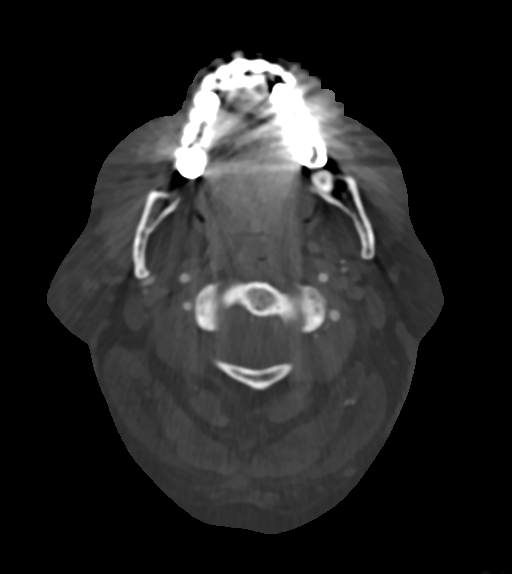
[im 251/335  soft-tissue]
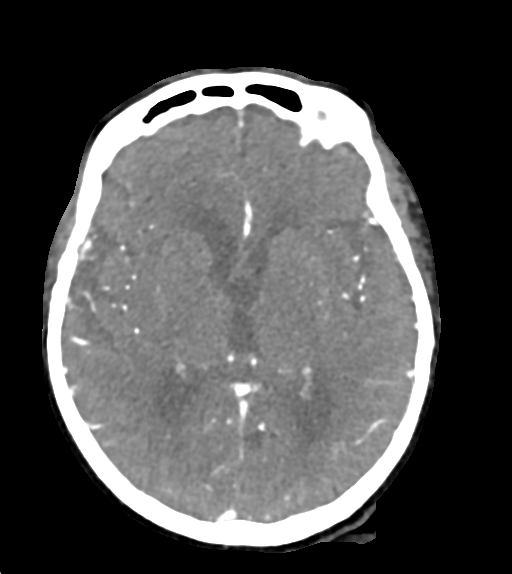

[3 of 33 positions shown; findings below may reference images not displayed]

FINDINGS: CT HEAD FINDINGS

Brain: There is no evidence of acute cortical infarct, intracranial
hemorrhage, mass, midline shift, or extra-axial fluid collection.
Prominence of the lateral and third ventricles is unchanged and may
reflect central predominant cerebral atrophy or less likely chronic
hydrocephalus. Periventricular white matter hypodensities are
unchanged and nonspecific but compatible with mild chronic small
vessel ischemic disease.

Vascular: No hyperdense vessel or unexpected calcification.

Skull: No fracture or focal osseous lesion.

Sinuses: The visualized paranasal sinuses and mastoid air cells are
clear.

Orbits: Prior bilateral cataract extraction.

Review of the MIP images confirms the above findings

CTA NECK FINDINGS

Aortic arch: 3 vessel aortic arch. Mild mixed calcified and
noncalcified plaque in the aortic arch. Brachiocephalic and
subclavian arteries are patent without significant stenosis.

Right carotid system: Patent. Focal eccentric, calcified plaque
posteriorly at the carotid bifurcation results in 60% proximal ICA
stenosis.

Left carotid system: Patent with mild intimal thickening in the
common and proximal internal carotid arteries. No evidence of
dissection or significant stenosis.

Vertebral arteries: Patent without evidence of stenosis or
dissection. Left vertebral artery is slightly dominant.

Skeleton: Cervical disc degeneration, more fully evaluated below.
Bilateral TMJ arthrosis.

Other neck: 3 mm low-density lesion in the right thyroid lobe.

Upper chest: Clear lung apices.

Review of the MIP images confirms the above findings

CTA HEAD FINDINGS

Anterior circulation: The internal carotid arteries are widely
patent from skullbase to carotid termini. ACAs and MCAs are patent
without evidence of major branch occlusion or significant stenosis.
No aneurysm.

Posterior circulation: The intracranial vertebral arteries are
widely patent to the basilar with the left being mildly dominant.
Right PICA, left AICA, and bilateral SCA origins are patent. Basilar
artery is patent without stenosis. There are patent posterior
communicating arteries bilaterally. PCAs are patent without
stenosis. No aneurysm.

Venous sinuses: Patent.

Anatomic variants: None.

Delayed phase: No abnormal enhancement.

Review of the MIP images confirms the above findings

CT CERVICAL SPINE FINDINGS

Vertebral alignment is normal. No fracture or destructive osseous
lesion is identified. Moderate disc space narrowing is again seen at
C5-6 and C6-7 with progressive degenerative endplate changes since
1330. Paraspinal soft tissues are unremarkable.

C2-3: Mild right facet arthrosis and minimal right uncovertebral
spurring without stenosis, unchanged from 1330.

C3-4: New mild disc bulging with annular calcification and mild left
facet arthrosis without stenosis.

C4-5: Minimal disc bulging, minimal uncovertebral spurring, and
progressive moderate left facet arthrosis result in new mild left
neural foraminal stenosis without spinal stenosis.

C5-6: Broad-based posterior disc osteophyte complex results in mild
spinal stenosis and mild left neural foraminal stenosis, similar to
prior.

C6-7: Progressive disc degeneration. Broad-based posterior disc
osteophyte complex without significant stenosis.

C7-T1:  Mild left facet arthrosis without stenosis.

Review of the MIP images confirms the above findings
IMPRESSION: 1. Unremarkable appearance of the intracranial arterial vasculature.
2. Focal calcified plaque at the right carotid bifurcation with 60%
proximal ICA stenosis.
3. Widely patent vertebral arteries.
4. No acute intracranial abnormality or mass.
5. Mildly progressive cervical disc degeneration, most advanced at
C5-6 and C6-7. Mild spinal and mild left foraminal stenosis at C5-6.
6. New mild left foraminal stenosis at C4-5.

## 2017-09-14 DIAGNOSIS — R4182 Altered mental status, unspecified: Secondary | ICD-10-CM | POA: Diagnosis not present

## 2017-09-15 DIAGNOSIS — R456 Violent behavior: Secondary | ICD-10-CM | POA: Diagnosis not present

## 2017-09-15 DIAGNOSIS — R402441 Other coma, without documented Glasgow coma scale score, or with partial score reported, in the field [EMT or ambulance]: Secondary | ICD-10-CM | POA: Diagnosis not present

## 2017-09-15 DIAGNOSIS — E119 Type 2 diabetes mellitus without complications: Secondary | ICD-10-CM | POA: Diagnosis not present

## 2017-09-15 DIAGNOSIS — I6789 Other cerebrovascular disease: Secondary | ICD-10-CM | POA: Diagnosis not present

## 2017-09-15 DIAGNOSIS — I10 Essential (primary) hypertension: Secondary | ICD-10-CM | POA: Diagnosis not present

## 2017-09-15 DIAGNOSIS — Z794 Long term (current) use of insulin: Secondary | ICD-10-CM | POA: Diagnosis not present

## 2017-09-15 DIAGNOSIS — F4489 Other dissociative and conversion disorders: Secondary | ICD-10-CM | POA: Diagnosis not present

## 2017-09-15 DIAGNOSIS — Z880 Allergy status to penicillin: Secondary | ICD-10-CM | POA: Diagnosis not present

## 2017-09-15 DIAGNOSIS — Z882 Allergy status to sulfonamides status: Secondary | ICD-10-CM | POA: Diagnosis not present

## 2017-09-15 DIAGNOSIS — R410 Disorientation, unspecified: Secondary | ICD-10-CM | POA: Diagnosis not present

## 2017-09-15 DIAGNOSIS — Z881 Allergy status to other antibiotic agents status: Secondary | ICD-10-CM | POA: Diagnosis not present

## 2017-09-15 DIAGNOSIS — F0391 Unspecified dementia with behavioral disturbance: Secondary | ICD-10-CM | POA: Diagnosis not present

## 2017-09-18 DIAGNOSIS — E11649 Type 2 diabetes mellitus with hypoglycemia without coma: Secondary | ICD-10-CM | POA: Diagnosis not present

## 2017-09-18 DIAGNOSIS — F0390 Unspecified dementia without behavioral disturbance: Secondary | ICD-10-CM | POA: Diagnosis not present

## 2017-09-18 DIAGNOSIS — Z794 Long term (current) use of insulin: Secondary | ICD-10-CM | POA: Diagnosis not present

## 2017-09-18 DIAGNOSIS — Z7984 Long term (current) use of oral hypoglycemic drugs: Secondary | ICD-10-CM | POA: Diagnosis not present

## 2017-09-18 DIAGNOSIS — R946 Abnormal results of thyroid function studies: Secondary | ICD-10-CM | POA: Diagnosis not present

## 2017-09-18 DIAGNOSIS — E119 Type 2 diabetes mellitus without complications: Secondary | ICD-10-CM | POA: Diagnosis not present

## 2017-09-18 DIAGNOSIS — Z91041 Radiographic dye allergy status: Secondary | ICD-10-CM | POA: Diagnosis not present

## 2017-09-18 DIAGNOSIS — Z882 Allergy status to sulfonamides status: Secondary | ICD-10-CM | POA: Diagnosis not present

## 2017-09-18 DIAGNOSIS — R402441 Other coma, without documented Glasgow coma scale score, or with partial score reported, in the field [EMT or ambulance]: Secondary | ICD-10-CM | POA: Diagnosis not present

## 2017-09-18 DIAGNOSIS — E1165 Type 2 diabetes mellitus with hyperglycemia: Secondary | ICD-10-CM | POA: Diagnosis not present

## 2017-09-18 DIAGNOSIS — I10 Essential (primary) hypertension: Secondary | ICD-10-CM | POA: Diagnosis not present

## 2017-09-18 DIAGNOSIS — Z881 Allergy status to other antibiotic agents status: Secondary | ICD-10-CM | POA: Diagnosis not present

## 2017-09-18 DIAGNOSIS — Z880 Allergy status to penicillin: Secondary | ICD-10-CM | POA: Diagnosis not present

## 2017-09-19 DIAGNOSIS — I10 Essential (primary) hypertension: Secondary | ICD-10-CM | POA: Diagnosis not present

## 2017-09-19 DIAGNOSIS — F0390 Unspecified dementia without behavioral disturbance: Secondary | ICD-10-CM | POA: Diagnosis not present

## 2017-09-19 DIAGNOSIS — R946 Abnormal results of thyroid function studies: Secondary | ICD-10-CM | POA: Diagnosis not present

## 2017-09-19 DIAGNOSIS — Z794 Long term (current) use of insulin: Secondary | ICD-10-CM | POA: Diagnosis not present

## 2017-09-19 DIAGNOSIS — E119 Type 2 diabetes mellitus without complications: Secondary | ICD-10-CM | POA: Diagnosis not present

## 2017-09-20 DIAGNOSIS — R946 Abnormal results of thyroid function studies: Secondary | ICD-10-CM | POA: Diagnosis not present

## 2017-09-20 DIAGNOSIS — F339 Major depressive disorder, recurrent, unspecified: Secondary | ICD-10-CM | POA: Diagnosis not present

## 2017-09-20 DIAGNOSIS — I10 Essential (primary) hypertension: Secondary | ICD-10-CM | POA: Diagnosis not present

## 2017-09-20 DIAGNOSIS — F419 Anxiety disorder, unspecified: Secondary | ICD-10-CM | POA: Diagnosis not present

## 2017-09-20 DIAGNOSIS — E119 Type 2 diabetes mellitus without complications: Secondary | ICD-10-CM | POA: Diagnosis not present

## 2017-09-20 DIAGNOSIS — F0390 Unspecified dementia without behavioral disturbance: Secondary | ICD-10-CM | POA: Diagnosis not present

## 2017-09-20 DIAGNOSIS — Z794 Long term (current) use of insulin: Secondary | ICD-10-CM | POA: Diagnosis not present

## 2017-09-21 DIAGNOSIS — F419 Anxiety disorder, unspecified: Secondary | ICD-10-CM | POA: Diagnosis not present

## 2017-09-21 DIAGNOSIS — I10 Essential (primary) hypertension: Secondary | ICD-10-CM | POA: Diagnosis not present

## 2017-09-21 DIAGNOSIS — F0390 Unspecified dementia without behavioral disturbance: Secondary | ICD-10-CM | POA: Diagnosis not present

## 2017-09-21 DIAGNOSIS — E119 Type 2 diabetes mellitus without complications: Secondary | ICD-10-CM | POA: Diagnosis not present

## 2017-09-21 DIAGNOSIS — Z794 Long term (current) use of insulin: Secondary | ICD-10-CM | POA: Diagnosis not present

## 2017-09-21 DIAGNOSIS — R946 Abnormal results of thyroid function studies: Secondary | ICD-10-CM | POA: Diagnosis not present

## 2017-09-21 DIAGNOSIS — F339 Major depressive disorder, recurrent, unspecified: Secondary | ICD-10-CM | POA: Diagnosis not present

## 2017-09-22 DIAGNOSIS — E119 Type 2 diabetes mellitus without complications: Secondary | ICD-10-CM | POA: Diagnosis not present

## 2017-09-22 DIAGNOSIS — Z794 Long term (current) use of insulin: Secondary | ICD-10-CM | POA: Diagnosis not present

## 2017-09-22 DIAGNOSIS — I10 Essential (primary) hypertension: Secondary | ICD-10-CM | POA: Diagnosis not present

## 2017-09-22 DIAGNOSIS — R946 Abnormal results of thyroid function studies: Secondary | ICD-10-CM | POA: Diagnosis not present

## 2017-09-22 DIAGNOSIS — F0390 Unspecified dementia without behavioral disturbance: Secondary | ICD-10-CM | POA: Diagnosis not present

## 2017-09-23 DIAGNOSIS — E119 Type 2 diabetes mellitus without complications: Secondary | ICD-10-CM | POA: Diagnosis not present

## 2017-09-23 DIAGNOSIS — Z794 Long term (current) use of insulin: Secondary | ICD-10-CM | POA: Diagnosis not present

## 2017-09-23 DIAGNOSIS — I10 Essential (primary) hypertension: Secondary | ICD-10-CM | POA: Diagnosis not present

## 2017-09-23 DIAGNOSIS — R4182 Altered mental status, unspecified: Secondary | ICD-10-CM | POA: Diagnosis not present

## 2017-09-23 DIAGNOSIS — F0390 Unspecified dementia without behavioral disturbance: Secondary | ICD-10-CM | POA: Diagnosis not present

## 2017-09-23 DIAGNOSIS — R946 Abnormal results of thyroid function studies: Secondary | ICD-10-CM | POA: Diagnosis not present

## 2017-09-24 DIAGNOSIS — I10 Essential (primary) hypertension: Secondary | ICD-10-CM | POA: Diagnosis not present

## 2017-09-24 DIAGNOSIS — F0390 Unspecified dementia without behavioral disturbance: Secondary | ICD-10-CM | POA: Diagnosis not present

## 2017-09-24 DIAGNOSIS — Z794 Long term (current) use of insulin: Secondary | ICD-10-CM | POA: Diagnosis not present

## 2017-09-24 DIAGNOSIS — R946 Abnormal results of thyroid function studies: Secondary | ICD-10-CM | POA: Diagnosis not present

## 2017-09-24 DIAGNOSIS — E119 Type 2 diabetes mellitus without complications: Secondary | ICD-10-CM | POA: Diagnosis not present

## 2017-09-25 DIAGNOSIS — F0390 Unspecified dementia without behavioral disturbance: Secondary | ICD-10-CM | POA: Diagnosis not present

## 2017-09-25 DIAGNOSIS — E119 Type 2 diabetes mellitus without complications: Secondary | ICD-10-CM | POA: Diagnosis not present

## 2017-09-25 DIAGNOSIS — R946 Abnormal results of thyroid function studies: Secondary | ICD-10-CM | POA: Diagnosis not present

## 2017-09-25 DIAGNOSIS — Z794 Long term (current) use of insulin: Secondary | ICD-10-CM | POA: Diagnosis not present

## 2017-09-25 DIAGNOSIS — I10 Essential (primary) hypertension: Secondary | ICD-10-CM | POA: Diagnosis not present

## 2017-09-26 DIAGNOSIS — F0151 Vascular dementia with behavioral disturbance: Secondary | ICD-10-CM | POA: Diagnosis not present

## 2017-09-26 DIAGNOSIS — I10 Essential (primary) hypertension: Secondary | ICD-10-CM | POA: Diagnosis not present

## 2017-09-26 DIAGNOSIS — Z794 Long term (current) use of insulin: Secondary | ICD-10-CM | POA: Diagnosis not present

## 2017-09-26 DIAGNOSIS — E119 Type 2 diabetes mellitus without complications: Secondary | ICD-10-CM | POA: Diagnosis not present

## 2017-09-27 DIAGNOSIS — F0151 Vascular dementia with behavioral disturbance: Secondary | ICD-10-CM | POA: Diagnosis not present

## 2017-09-27 DIAGNOSIS — I10 Essential (primary) hypertension: Secondary | ICD-10-CM | POA: Diagnosis not present

## 2017-09-27 DIAGNOSIS — Z794 Long term (current) use of insulin: Secondary | ICD-10-CM | POA: Diagnosis not present

## 2017-09-27 DIAGNOSIS — E119 Type 2 diabetes mellitus without complications: Secondary | ICD-10-CM | POA: Diagnosis not present

## 2017-09-28 DIAGNOSIS — E119 Type 2 diabetes mellitus without complications: Secondary | ICD-10-CM | POA: Diagnosis not present

## 2017-09-28 DIAGNOSIS — F0151 Vascular dementia with behavioral disturbance: Secondary | ICD-10-CM | POA: Diagnosis not present

## 2017-09-28 DIAGNOSIS — Z794 Long term (current) use of insulin: Secondary | ICD-10-CM | POA: Diagnosis not present

## 2017-09-28 DIAGNOSIS — I10 Essential (primary) hypertension: Secondary | ICD-10-CM | POA: Diagnosis not present

## 2017-10-05 ENCOUNTER — Telehealth: Payer: Self-pay

## 2017-10-05 ENCOUNTER — Encounter: Payer: Medicare Other | Admitting: *Deleted

## 2017-10-05 NOTE — Telephone Encounter (Signed)
Attempted to confirm remote transmission with pt. No answer and was unable to leave a message.   

## 2017-10-13 DIAGNOSIS — L609 Nail disorder, unspecified: Secondary | ICD-10-CM | POA: Diagnosis not present

## 2017-10-16 ENCOUNTER — Ambulatory Visit: Payer: Medicare Other | Admitting: Family

## 2017-10-16 ENCOUNTER — Ambulatory Visit (HOSPITAL_COMMUNITY): Payer: Medicare Other | Attending: Surgery

## 2017-10-16 ENCOUNTER — Encounter: Payer: Self-pay | Admitting: Family

## 2017-10-30 DIAGNOSIS — E785 Hyperlipidemia, unspecified: Secondary | ICD-10-CM | POA: Diagnosis not present

## 2017-10-30 DIAGNOSIS — E1165 Type 2 diabetes mellitus with hyperglycemia: Secondary | ICD-10-CM | POA: Diagnosis not present

## 2017-10-30 DIAGNOSIS — E039 Hypothyroidism, unspecified: Secondary | ICD-10-CM | POA: Diagnosis not present

## 2017-10-30 DIAGNOSIS — F0391 Unspecified dementia with behavioral disturbance: Secondary | ICD-10-CM | POA: Diagnosis not present

## 2017-10-30 DIAGNOSIS — M6281 Muscle weakness (generalized): Secondary | ICD-10-CM | POA: Diagnosis not present

## 2017-10-30 DIAGNOSIS — K219 Gastro-esophageal reflux disease without esophagitis: Secondary | ICD-10-CM | POA: Diagnosis not present

## 2017-10-30 DIAGNOSIS — M170 Bilateral primary osteoarthritis of knee: Secondary | ICD-10-CM | POA: Diagnosis not present

## 2017-10-30 DIAGNOSIS — R2689 Other abnormalities of gait and mobility: Secondary | ICD-10-CM | POA: Diagnosis not present

## 2017-10-30 DIAGNOSIS — I10 Essential (primary) hypertension: Secondary | ICD-10-CM | POA: Diagnosis not present

## 2017-10-30 DIAGNOSIS — K5900 Constipation, unspecified: Secondary | ICD-10-CM | POA: Diagnosis not present

## 2017-11-15 DIAGNOSIS — F419 Anxiety disorder, unspecified: Secondary | ICD-10-CM | POA: Diagnosis not present

## 2017-11-15 DIAGNOSIS — F0630 Mood disorder due to known physiological condition, unspecified: Secondary | ICD-10-CM | POA: Diagnosis not present

## 2017-11-15 DIAGNOSIS — R451 Restlessness and agitation: Secondary | ICD-10-CM | POA: Diagnosis not present

## 2017-11-15 DIAGNOSIS — F0391 Unspecified dementia with behavioral disturbance: Secondary | ICD-10-CM | POA: Diagnosis not present

## 2017-11-17 DIAGNOSIS — Z79899 Other long term (current) drug therapy: Secondary | ICD-10-CM | POA: Diagnosis not present

## 2017-11-17 DIAGNOSIS — E785 Hyperlipidemia, unspecified: Secondary | ICD-10-CM | POA: Diagnosis not present

## 2017-11-17 DIAGNOSIS — E039 Hypothyroidism, unspecified: Secondary | ICD-10-CM | POA: Diagnosis not present

## 2017-12-06 IMAGING — RF DG C-ARM 61-120 MIN
1 series · 2 of 2 positions shown · non-contrast
Comparison: CTA of the neck performed 02/03/2016

CLINICAL DATA: Anterior cervical spinal fusion at C5-C7.

EXAM:
CERVICAL SPINE - 2-3 VIEW

[Series 1: run · 2 of 2 slices shown]
[im 1/2]
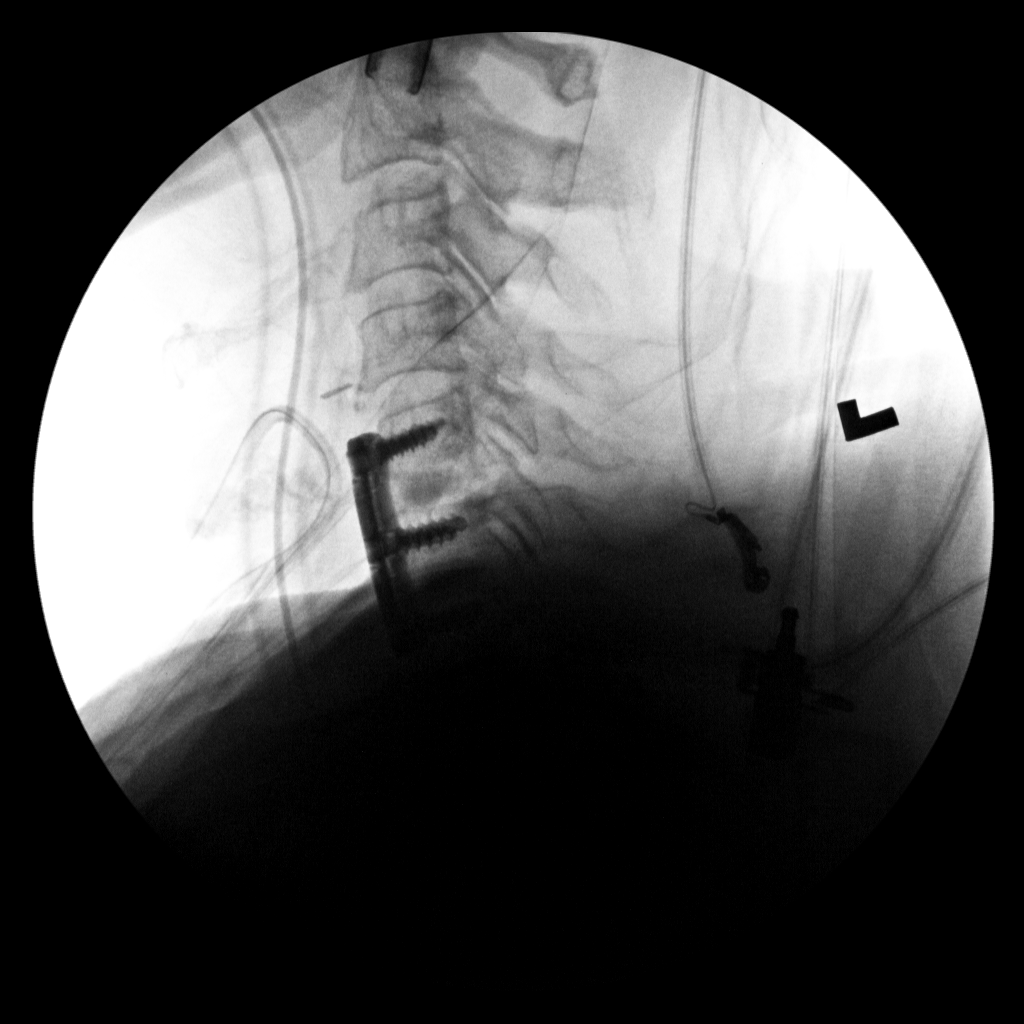
[im 2/2]
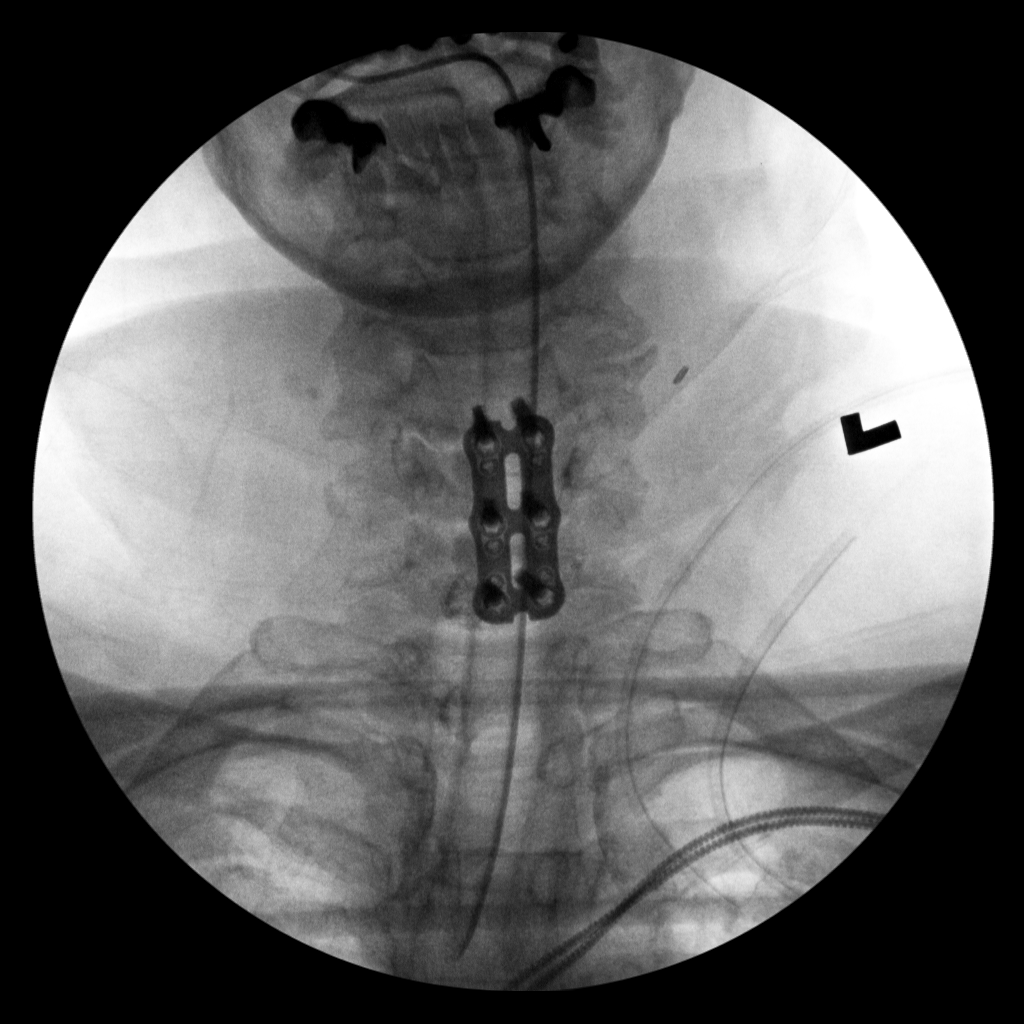

[2 of 2 positions shown; findings below may reference images not displayed]

FINDINGS: Anterior cervical spinal fusion hardware is noted at C5-C7, on two
provided C-arm images from the OR. Mild underlying degenerative
change is noted along the cervical spine.
IMPRESSION: Status post anterior cervical spinal fusion at C5-C7.

## 2017-12-07 DIAGNOSIS — Z794 Long term (current) use of insulin: Secondary | ICD-10-CM | POA: Diagnosis not present

## 2017-12-07 DIAGNOSIS — R410 Disorientation, unspecified: Secondary | ICD-10-CM | POA: Diagnosis not present

## 2017-12-07 DIAGNOSIS — E1165 Type 2 diabetes mellitus with hyperglycemia: Secondary | ICD-10-CM | POA: Diagnosis not present

## 2017-12-07 DIAGNOSIS — Z743 Need for continuous supervision: Secondary | ICD-10-CM | POA: Diagnosis not present

## 2017-12-07 DIAGNOSIS — I959 Hypotension, unspecified: Secondary | ICD-10-CM | POA: Diagnosis not present

## 2017-12-08 DIAGNOSIS — E860 Dehydration: Secondary | ICD-10-CM | POA: Diagnosis not present

## 2017-12-08 DIAGNOSIS — E1165 Type 2 diabetes mellitus with hyperglycemia: Secondary | ICD-10-CM | POA: Diagnosis not present

## 2017-12-08 DIAGNOSIS — G9341 Metabolic encephalopathy: Secondary | ICD-10-CM | POA: Diagnosis not present

## 2017-12-08 DIAGNOSIS — Z794 Long term (current) use of insulin: Secondary | ICD-10-CM | POA: Diagnosis not present

## 2017-12-08 DIAGNOSIS — Z6824 Body mass index (BMI) 24.0-24.9, adult: Secondary | ICD-10-CM | POA: Diagnosis not present

## 2017-12-08 DIAGNOSIS — R0602 Shortness of breath: Secondary | ICD-10-CM | POA: Diagnosis not present

## 2017-12-08 DIAGNOSIS — I959 Hypotension, unspecified: Secondary | ICD-10-CM | POA: Diagnosis not present

## 2017-12-08 DIAGNOSIS — E876 Hypokalemia: Secondary | ICD-10-CM | POA: Diagnosis not present

## 2017-12-08 DIAGNOSIS — R739 Hyperglycemia, unspecified: Secondary | ICD-10-CM | POA: Diagnosis not present

## 2017-12-08 DIAGNOSIS — F0151 Vascular dementia with behavioral disturbance: Secondary | ICD-10-CM | POA: Diagnosis not present

## 2017-12-08 DIAGNOSIS — I10 Essential (primary) hypertension: Secondary | ICD-10-CM | POA: Diagnosis not present

## 2017-12-08 DIAGNOSIS — Z881 Allergy status to other antibiotic agents status: Secondary | ICD-10-CM | POA: Diagnosis not present

## 2017-12-08 DIAGNOSIS — F0390 Unspecified dementia without behavioral disturbance: Secondary | ICD-10-CM | POA: Diagnosis not present

## 2017-12-08 DIAGNOSIS — E871 Hypo-osmolality and hyponatremia: Secondary | ICD-10-CM | POA: Diagnosis not present

## 2017-12-08 DIAGNOSIS — Z880 Allergy status to penicillin: Secondary | ICD-10-CM | POA: Diagnosis not present

## 2017-12-08 DIAGNOSIS — N179 Acute kidney failure, unspecified: Secondary | ICD-10-CM | POA: Diagnosis not present

## 2017-12-08 DIAGNOSIS — Z882 Allergy status to sulfonamides status: Secondary | ICD-10-CM | POA: Diagnosis not present

## 2017-12-08 DIAGNOSIS — R404 Transient alteration of awareness: Secondary | ICD-10-CM | POA: Diagnosis not present

## 2017-12-08 DIAGNOSIS — Z09 Encounter for follow-up examination after completed treatment for conditions other than malignant neoplasm: Secondary | ICD-10-CM | POA: Diagnosis not present

## 2017-12-08 DIAGNOSIS — B370 Candidal stomatitis: Secondary | ICD-10-CM | POA: Diagnosis not present

## 2017-12-09 DIAGNOSIS — I10 Essential (primary) hypertension: Secondary | ICD-10-CM | POA: Diagnosis not present

## 2017-12-09 DIAGNOSIS — G9349 Other encephalopathy: Secondary | ICD-10-CM | POA: Diagnosis not present

## 2017-12-09 DIAGNOSIS — F0151 Vascular dementia with behavioral disturbance: Secondary | ICD-10-CM | POA: Diagnosis not present

## 2017-12-09 DIAGNOSIS — R739 Hyperglycemia, unspecified: Secondary | ICD-10-CM | POA: Diagnosis not present

## 2017-12-09 DIAGNOSIS — K6389 Other specified diseases of intestine: Secondary | ICD-10-CM | POA: Diagnosis not present

## 2017-12-10 DIAGNOSIS — R739 Hyperglycemia, unspecified: Secondary | ICD-10-CM | POA: Diagnosis not present

## 2017-12-10 DIAGNOSIS — I10 Essential (primary) hypertension: Secondary | ICD-10-CM | POA: Diagnosis not present

## 2017-12-10 DIAGNOSIS — F0151 Vascular dementia with behavioral disturbance: Secondary | ICD-10-CM | POA: Diagnosis not present

## 2017-12-10 DIAGNOSIS — G9349 Other encephalopathy: Secondary | ICD-10-CM | POA: Diagnosis not present

## 2017-12-11 DIAGNOSIS — I10 Essential (primary) hypertension: Secondary | ICD-10-CM | POA: Diagnosis not present

## 2017-12-11 DIAGNOSIS — E119 Type 2 diabetes mellitus without complications: Secondary | ICD-10-CM | POA: Diagnosis not present

## 2017-12-11 DIAGNOSIS — G9341 Metabolic encephalopathy: Secondary | ICD-10-CM | POA: Diagnosis not present

## 2017-12-11 DIAGNOSIS — E876 Hypokalemia: Secondary | ICD-10-CM | POA: Diagnosis not present

## 2017-12-11 DIAGNOSIS — R739 Hyperglycemia, unspecified: Secondary | ICD-10-CM | POA: Diagnosis not present

## 2017-12-13 DIAGNOSIS — F0390 Unspecified dementia without behavioral disturbance: Secondary | ICD-10-CM | POA: Diagnosis not present

## 2017-12-13 DIAGNOSIS — Z794 Long term (current) use of insulin: Secondary | ICD-10-CM | POA: Diagnosis not present

## 2017-12-13 DIAGNOSIS — E1165 Type 2 diabetes mellitus with hyperglycemia: Secondary | ICD-10-CM | POA: Diagnosis not present

## 2017-12-13 DIAGNOSIS — I10 Essential (primary) hypertension: Secondary | ICD-10-CM | POA: Diagnosis not present

## 2017-12-13 DIAGNOSIS — E876 Hypokalemia: Secondary | ICD-10-CM | POA: Diagnosis not present

## 2017-12-14 DIAGNOSIS — E876 Hypokalemia: Secondary | ICD-10-CM | POA: Diagnosis not present

## 2017-12-14 DIAGNOSIS — F0151 Vascular dementia with behavioral disturbance: Secondary | ICD-10-CM | POA: Diagnosis not present

## 2017-12-14 DIAGNOSIS — Z6824 Body mass index (BMI) 24.0-24.9, adult: Secondary | ICD-10-CM | POA: Diagnosis not present

## 2017-12-14 DIAGNOSIS — E1165 Type 2 diabetes mellitus with hyperglycemia: Secondary | ICD-10-CM | POA: Diagnosis not present

## 2017-12-14 DIAGNOSIS — B370 Candidal stomatitis: Secondary | ICD-10-CM | POA: Diagnosis not present

## 2017-12-14 DIAGNOSIS — Z659 Problem related to unspecified psychosocial circumstances: Secondary | ICD-10-CM | POA: Diagnosis not present

## 2017-12-14 DIAGNOSIS — Z09 Encounter for follow-up examination after completed treatment for conditions other than malignant neoplasm: Secondary | ICD-10-CM | POA: Diagnosis not present

## 2017-12-18 DIAGNOSIS — E876 Hypokalemia: Secondary | ICD-10-CM | POA: Diagnosis not present

## 2017-12-18 DIAGNOSIS — I10 Essential (primary) hypertension: Secondary | ICD-10-CM | POA: Diagnosis not present

## 2017-12-18 DIAGNOSIS — F0390 Unspecified dementia without behavioral disturbance: Secondary | ICD-10-CM | POA: Diagnosis not present

## 2017-12-18 DIAGNOSIS — Z794 Long term (current) use of insulin: Secondary | ICD-10-CM | POA: Diagnosis not present

## 2017-12-18 DIAGNOSIS — E1165 Type 2 diabetes mellitus with hyperglycemia: Secondary | ICD-10-CM | POA: Diagnosis not present

## 2017-12-21 DIAGNOSIS — I10 Essential (primary) hypertension: Secondary | ICD-10-CM | POA: Diagnosis not present

## 2017-12-21 DIAGNOSIS — F0390 Unspecified dementia without behavioral disturbance: Secondary | ICD-10-CM | POA: Diagnosis not present

## 2017-12-21 DIAGNOSIS — E876 Hypokalemia: Secondary | ICD-10-CM | POA: Diagnosis not present

## 2017-12-21 DIAGNOSIS — E1165 Type 2 diabetes mellitus with hyperglycemia: Secondary | ICD-10-CM | POA: Diagnosis not present

## 2017-12-21 DIAGNOSIS — Z794 Long term (current) use of insulin: Secondary | ICD-10-CM | POA: Diagnosis not present

## 2017-12-25 DIAGNOSIS — E1165 Type 2 diabetes mellitus with hyperglycemia: Secondary | ICD-10-CM | POA: Diagnosis not present

## 2017-12-25 DIAGNOSIS — F0390 Unspecified dementia without behavioral disturbance: Secondary | ICD-10-CM | POA: Diagnosis not present

## 2017-12-25 DIAGNOSIS — E876 Hypokalemia: Secondary | ICD-10-CM | POA: Diagnosis not present

## 2017-12-25 DIAGNOSIS — I10 Essential (primary) hypertension: Secondary | ICD-10-CM | POA: Diagnosis not present

## 2017-12-25 DIAGNOSIS — Z794 Long term (current) use of insulin: Secondary | ICD-10-CM | POA: Diagnosis not present

## 2017-12-28 ENCOUNTER — Encounter: Payer: Self-pay | Admitting: Cardiology

## 2017-12-28 DIAGNOSIS — Z794 Long term (current) use of insulin: Secondary | ICD-10-CM | POA: Diagnosis not present

## 2017-12-28 DIAGNOSIS — E876 Hypokalemia: Secondary | ICD-10-CM | POA: Diagnosis not present

## 2017-12-28 DIAGNOSIS — F0390 Unspecified dementia without behavioral disturbance: Secondary | ICD-10-CM | POA: Diagnosis not present

## 2017-12-28 DIAGNOSIS — I10 Essential (primary) hypertension: Secondary | ICD-10-CM | POA: Diagnosis not present

## 2017-12-28 DIAGNOSIS — E1165 Type 2 diabetes mellitus with hyperglycemia: Secondary | ICD-10-CM | POA: Diagnosis not present

## 2018-01-01 DIAGNOSIS — E876 Hypokalemia: Secondary | ICD-10-CM | POA: Diagnosis not present

## 2018-01-01 DIAGNOSIS — F0390 Unspecified dementia without behavioral disturbance: Secondary | ICD-10-CM | POA: Diagnosis not present

## 2018-01-01 DIAGNOSIS — Z794 Long term (current) use of insulin: Secondary | ICD-10-CM | POA: Diagnosis not present

## 2018-01-01 DIAGNOSIS — I10 Essential (primary) hypertension: Secondary | ICD-10-CM | POA: Diagnosis not present

## 2018-01-01 DIAGNOSIS — E1165 Type 2 diabetes mellitus with hyperglycemia: Secondary | ICD-10-CM | POA: Diagnosis not present

## 2018-01-03 DIAGNOSIS — F0390 Unspecified dementia without behavioral disturbance: Secondary | ICD-10-CM | POA: Diagnosis not present

## 2018-01-03 DIAGNOSIS — E1165 Type 2 diabetes mellitus with hyperglycemia: Secondary | ICD-10-CM | POA: Diagnosis not present

## 2018-01-03 DIAGNOSIS — I10 Essential (primary) hypertension: Secondary | ICD-10-CM | POA: Diagnosis not present

## 2018-01-03 DIAGNOSIS — E876 Hypokalemia: Secondary | ICD-10-CM | POA: Diagnosis not present

## 2018-01-03 DIAGNOSIS — Z794 Long term (current) use of insulin: Secondary | ICD-10-CM | POA: Diagnosis not present

## 2018-01-10 DIAGNOSIS — I10 Essential (primary) hypertension: Secondary | ICD-10-CM | POA: Diagnosis not present

## 2018-01-10 DIAGNOSIS — E1165 Type 2 diabetes mellitus with hyperglycemia: Secondary | ICD-10-CM | POA: Diagnosis not present

## 2018-01-10 DIAGNOSIS — Z794 Long term (current) use of insulin: Secondary | ICD-10-CM | POA: Diagnosis not present

## 2018-01-10 DIAGNOSIS — E876 Hypokalemia: Secondary | ICD-10-CM | POA: Diagnosis not present

## 2018-01-10 DIAGNOSIS — F0390 Unspecified dementia without behavioral disturbance: Secondary | ICD-10-CM | POA: Diagnosis not present

## 2018-01-11 DIAGNOSIS — E785 Hyperlipidemia, unspecified: Secondary | ICD-10-CM | POA: Diagnosis not present

## 2018-01-11 DIAGNOSIS — Z659 Problem related to unspecified psychosocial circumstances: Secondary | ICD-10-CM | POA: Diagnosis not present

## 2018-01-11 DIAGNOSIS — L989 Disorder of the skin and subcutaneous tissue, unspecified: Secondary | ICD-10-CM | POA: Diagnosis not present

## 2018-01-11 DIAGNOSIS — E1165 Type 2 diabetes mellitus with hyperglycemia: Secondary | ICD-10-CM | POA: Diagnosis not present

## 2018-01-11 DIAGNOSIS — Z09 Encounter for follow-up examination after completed treatment for conditions other than malignant neoplasm: Secondary | ICD-10-CM | POA: Diagnosis not present

## 2018-01-11 DIAGNOSIS — F0151 Vascular dementia with behavioral disturbance: Secondary | ICD-10-CM | POA: Diagnosis not present

## 2018-01-11 DIAGNOSIS — Z6824 Body mass index (BMI) 24.0-24.9, adult: Secondary | ICD-10-CM | POA: Diagnosis not present

## 2018-01-11 DIAGNOSIS — M25522 Pain in left elbow: Secondary | ICD-10-CM | POA: Diagnosis not present

## 2018-01-11 DIAGNOSIS — I10 Essential (primary) hypertension: Secondary | ICD-10-CM | POA: Diagnosis not present

## 2018-01-17 DIAGNOSIS — E1165 Type 2 diabetes mellitus with hyperglycemia: Secondary | ICD-10-CM | POA: Diagnosis not present

## 2018-01-17 DIAGNOSIS — F0390 Unspecified dementia without behavioral disturbance: Secondary | ICD-10-CM | POA: Diagnosis not present

## 2018-01-17 DIAGNOSIS — Z794 Long term (current) use of insulin: Secondary | ICD-10-CM | POA: Diagnosis not present

## 2018-01-17 DIAGNOSIS — E876 Hypokalemia: Secondary | ICD-10-CM | POA: Diagnosis not present

## 2018-01-17 DIAGNOSIS — I10 Essential (primary) hypertension: Secondary | ICD-10-CM | POA: Diagnosis not present

## 2018-01-24 DIAGNOSIS — Z794 Long term (current) use of insulin: Secondary | ICD-10-CM | POA: Diagnosis not present

## 2018-01-24 DIAGNOSIS — E876 Hypokalemia: Secondary | ICD-10-CM | POA: Diagnosis not present

## 2018-01-24 DIAGNOSIS — F0390 Unspecified dementia without behavioral disturbance: Secondary | ICD-10-CM | POA: Diagnosis not present

## 2018-01-24 DIAGNOSIS — I10 Essential (primary) hypertension: Secondary | ICD-10-CM | POA: Diagnosis not present

## 2018-01-24 DIAGNOSIS — E1165 Type 2 diabetes mellitus with hyperglycemia: Secondary | ICD-10-CM | POA: Diagnosis not present

## 2018-01-29 DIAGNOSIS — Z794 Long term (current) use of insulin: Secondary | ICD-10-CM | POA: Diagnosis not present

## 2018-01-29 DIAGNOSIS — E876 Hypokalemia: Secondary | ICD-10-CM | POA: Diagnosis not present

## 2018-01-29 DIAGNOSIS — E1165 Type 2 diabetes mellitus with hyperglycemia: Secondary | ICD-10-CM | POA: Diagnosis not present

## 2018-01-29 DIAGNOSIS — F0390 Unspecified dementia without behavioral disturbance: Secondary | ICD-10-CM | POA: Diagnosis not present

## 2018-01-29 DIAGNOSIS — I10 Essential (primary) hypertension: Secondary | ICD-10-CM | POA: Diagnosis not present

## 2018-02-02 DIAGNOSIS — Z794 Long term (current) use of insulin: Secondary | ICD-10-CM | POA: Diagnosis not present

## 2018-02-02 DIAGNOSIS — E876 Hypokalemia: Secondary | ICD-10-CM | POA: Diagnosis not present

## 2018-02-02 DIAGNOSIS — I10 Essential (primary) hypertension: Secondary | ICD-10-CM | POA: Diagnosis not present

## 2018-02-02 DIAGNOSIS — E1165 Type 2 diabetes mellitus with hyperglycemia: Secondary | ICD-10-CM | POA: Diagnosis not present

## 2018-02-02 DIAGNOSIS — F0390 Unspecified dementia without behavioral disturbance: Secondary | ICD-10-CM | POA: Diagnosis not present

## 2018-02-05 DIAGNOSIS — H2513 Age-related nuclear cataract, bilateral: Secondary | ICD-10-CM | POA: Diagnosis not present

## 2018-02-05 DIAGNOSIS — H353131 Nonexudative age-related macular degeneration, bilateral, early dry stage: Secondary | ICD-10-CM | POA: Diagnosis not present

## 2018-02-05 DIAGNOSIS — H5213 Myopia, bilateral: Secondary | ICD-10-CM | POA: Diagnosis not present

## 2018-02-05 DIAGNOSIS — H52223 Regular astigmatism, bilateral: Secondary | ICD-10-CM | POA: Diagnosis not present

## 2018-02-06 DIAGNOSIS — Z794 Long term (current) use of insulin: Secondary | ICD-10-CM | POA: Diagnosis not present

## 2018-02-06 DIAGNOSIS — I10 Essential (primary) hypertension: Secondary | ICD-10-CM | POA: Diagnosis not present

## 2018-02-06 DIAGNOSIS — F0390 Unspecified dementia without behavioral disturbance: Secondary | ICD-10-CM | POA: Diagnosis not present

## 2018-02-06 DIAGNOSIS — E1165 Type 2 diabetes mellitus with hyperglycemia: Secondary | ICD-10-CM | POA: Diagnosis not present

## 2018-02-06 DIAGNOSIS — E876 Hypokalemia: Secondary | ICD-10-CM | POA: Diagnosis not present

## 2018-02-08 DIAGNOSIS — I639 Cerebral infarction, unspecified: Secondary | ICD-10-CM | POA: Diagnosis not present

## 2018-02-08 DIAGNOSIS — Z6824 Body mass index (BMI) 24.0-24.9, adult: Secondary | ICD-10-CM | POA: Diagnosis not present

## 2018-02-08 DIAGNOSIS — Z1331 Encounter for screening for depression: Secondary | ICD-10-CM | POA: Diagnosis not present

## 2018-02-08 DIAGNOSIS — F3340 Major depressive disorder, recurrent, in remission, unspecified: Secondary | ICD-10-CM | POA: Diagnosis not present

## 2018-02-08 DIAGNOSIS — Z950 Presence of cardiac pacemaker: Secondary | ICD-10-CM | POA: Diagnosis not present

## 2018-02-08 DIAGNOSIS — Z0000 Encounter for general adult medical examination without abnormal findings: Secondary | ICD-10-CM | POA: Diagnosis not present

## 2018-02-08 DIAGNOSIS — R5383 Other fatigue: Secondary | ICD-10-CM | POA: Diagnosis not present

## 2018-02-08 DIAGNOSIS — F0151 Vascular dementia with behavioral disturbance: Secondary | ICD-10-CM | POA: Diagnosis not present

## 2018-02-08 DIAGNOSIS — E1165 Type 2 diabetes mellitus with hyperglycemia: Secondary | ICD-10-CM | POA: Diagnosis not present

## 2018-02-08 DIAGNOSIS — N3281 Overactive bladder: Secondary | ICD-10-CM | POA: Diagnosis not present

## 2018-02-08 DIAGNOSIS — I10 Essential (primary) hypertension: Secondary | ICD-10-CM | POA: Diagnosis not present

## 2018-02-08 DIAGNOSIS — Z90710 Acquired absence of both cervix and uterus: Secondary | ICD-10-CM | POA: Diagnosis not present

## 2018-02-09 DIAGNOSIS — Z794 Long term (current) use of insulin: Secondary | ICD-10-CM | POA: Diagnosis not present

## 2018-02-09 DIAGNOSIS — F0390 Unspecified dementia without behavioral disturbance: Secondary | ICD-10-CM | POA: Diagnosis not present

## 2018-02-09 DIAGNOSIS — E876 Hypokalemia: Secondary | ICD-10-CM | POA: Diagnosis not present

## 2018-02-09 DIAGNOSIS — E1165 Type 2 diabetes mellitus with hyperglycemia: Secondary | ICD-10-CM | POA: Diagnosis not present

## 2018-02-09 DIAGNOSIS — I10 Essential (primary) hypertension: Secondary | ICD-10-CM | POA: Diagnosis not present

## 2018-02-11 DIAGNOSIS — F0390 Unspecified dementia without behavioral disturbance: Secondary | ICD-10-CM | POA: Diagnosis not present

## 2018-02-11 DIAGNOSIS — Z794 Long term (current) use of insulin: Secondary | ICD-10-CM | POA: Diagnosis not present

## 2018-02-11 DIAGNOSIS — E1165 Type 2 diabetes mellitus with hyperglycemia: Secondary | ICD-10-CM | POA: Diagnosis not present

## 2018-02-11 DIAGNOSIS — E876 Hypokalemia: Secondary | ICD-10-CM | POA: Diagnosis not present

## 2018-02-11 DIAGNOSIS — I10 Essential (primary) hypertension: Secondary | ICD-10-CM | POA: Diagnosis not present

## 2018-02-13 DIAGNOSIS — Z794 Long term (current) use of insulin: Secondary | ICD-10-CM | POA: Diagnosis not present

## 2018-02-13 DIAGNOSIS — I10 Essential (primary) hypertension: Secondary | ICD-10-CM | POA: Diagnosis not present

## 2018-02-13 DIAGNOSIS — E1165 Type 2 diabetes mellitus with hyperglycemia: Secondary | ICD-10-CM | POA: Diagnosis not present

## 2018-02-13 DIAGNOSIS — F0390 Unspecified dementia without behavioral disturbance: Secondary | ICD-10-CM | POA: Diagnosis not present

## 2018-02-13 DIAGNOSIS — E876 Hypokalemia: Secondary | ICD-10-CM | POA: Diagnosis not present

## 2018-02-19 DIAGNOSIS — E876 Hypokalemia: Secondary | ICD-10-CM | POA: Diagnosis not present

## 2018-02-19 DIAGNOSIS — F0390 Unspecified dementia without behavioral disturbance: Secondary | ICD-10-CM | POA: Diagnosis not present

## 2018-02-19 DIAGNOSIS — Z794 Long term (current) use of insulin: Secondary | ICD-10-CM | POA: Diagnosis not present

## 2018-02-19 DIAGNOSIS — E1165 Type 2 diabetes mellitus with hyperglycemia: Secondary | ICD-10-CM | POA: Diagnosis not present

## 2018-02-19 DIAGNOSIS — I10 Essential (primary) hypertension: Secondary | ICD-10-CM | POA: Diagnosis not present

## 2018-02-23 DIAGNOSIS — Z794 Long term (current) use of insulin: Secondary | ICD-10-CM | POA: Diagnosis not present

## 2018-02-23 DIAGNOSIS — I10 Essential (primary) hypertension: Secondary | ICD-10-CM | POA: Diagnosis not present

## 2018-02-23 DIAGNOSIS — F0390 Unspecified dementia without behavioral disturbance: Secondary | ICD-10-CM | POA: Diagnosis not present

## 2018-02-23 DIAGNOSIS — E876 Hypokalemia: Secondary | ICD-10-CM | POA: Diagnosis not present

## 2018-02-23 DIAGNOSIS — E1165 Type 2 diabetes mellitus with hyperglycemia: Secondary | ICD-10-CM | POA: Diagnosis not present

## 2018-03-01 DIAGNOSIS — E876 Hypokalemia: Secondary | ICD-10-CM | POA: Diagnosis not present

## 2018-03-01 DIAGNOSIS — E1165 Type 2 diabetes mellitus with hyperglycemia: Secondary | ICD-10-CM | POA: Diagnosis not present

## 2018-03-01 DIAGNOSIS — F0390 Unspecified dementia without behavioral disturbance: Secondary | ICD-10-CM | POA: Diagnosis not present

## 2018-03-01 DIAGNOSIS — Z794 Long term (current) use of insulin: Secondary | ICD-10-CM | POA: Diagnosis not present

## 2018-03-01 DIAGNOSIS — I10 Essential (primary) hypertension: Secondary | ICD-10-CM | POA: Diagnosis not present

## 2018-03-06 DIAGNOSIS — E1165 Type 2 diabetes mellitus with hyperglycemia: Secondary | ICD-10-CM | POA: Diagnosis not present

## 2018-03-06 DIAGNOSIS — I10 Essential (primary) hypertension: Secondary | ICD-10-CM | POA: Diagnosis not present

## 2018-03-06 DIAGNOSIS — F0390 Unspecified dementia without behavioral disturbance: Secondary | ICD-10-CM | POA: Diagnosis not present

## 2018-03-06 DIAGNOSIS — Z794 Long term (current) use of insulin: Secondary | ICD-10-CM | POA: Diagnosis not present

## 2018-03-06 DIAGNOSIS — E876 Hypokalemia: Secondary | ICD-10-CM | POA: Diagnosis not present

## 2018-03-09 DIAGNOSIS — F0390 Unspecified dementia without behavioral disturbance: Secondary | ICD-10-CM | POA: Diagnosis not present

## 2018-03-09 DIAGNOSIS — I10 Essential (primary) hypertension: Secondary | ICD-10-CM | POA: Diagnosis not present

## 2018-03-09 DIAGNOSIS — E876 Hypokalemia: Secondary | ICD-10-CM | POA: Diagnosis not present

## 2018-03-09 DIAGNOSIS — Z794 Long term (current) use of insulin: Secondary | ICD-10-CM | POA: Diagnosis not present

## 2018-03-09 DIAGNOSIS — E1165 Type 2 diabetes mellitus with hyperglycemia: Secondary | ICD-10-CM | POA: Diagnosis not present

## 2018-03-12 DIAGNOSIS — F0390 Unspecified dementia without behavioral disturbance: Secondary | ICD-10-CM | POA: Diagnosis not present

## 2018-03-12 DIAGNOSIS — I10 Essential (primary) hypertension: Secondary | ICD-10-CM | POA: Diagnosis not present

## 2018-03-12 DIAGNOSIS — E876 Hypokalemia: Secondary | ICD-10-CM | POA: Diagnosis not present

## 2018-03-12 DIAGNOSIS — E1165 Type 2 diabetes mellitus with hyperglycemia: Secondary | ICD-10-CM | POA: Diagnosis not present

## 2018-03-12 DIAGNOSIS — Z794 Long term (current) use of insulin: Secondary | ICD-10-CM | POA: Diagnosis not present

## 2018-04-02 DIAGNOSIS — Z6824 Body mass index (BMI) 24.0-24.9, adult: Secondary | ICD-10-CM | POA: Diagnosis not present

## 2018-04-02 DIAGNOSIS — Z6821 Body mass index (BMI) 21.0-21.9, adult: Secondary | ICD-10-CM | POA: Diagnosis not present

## 2018-04-02 DIAGNOSIS — E1165 Type 2 diabetes mellitus with hyperglycemia: Secondary | ICD-10-CM | POA: Diagnosis not present

## 2018-04-02 DIAGNOSIS — M25511 Pain in right shoulder: Secondary | ICD-10-CM | POA: Diagnosis not present

## 2018-04-02 DIAGNOSIS — I10 Essential (primary) hypertension: Secondary | ICD-10-CM | POA: Diagnosis not present

## 2018-04-02 DIAGNOSIS — Z659 Problem related to unspecified psychosocial circumstances: Secondary | ICD-10-CM | POA: Diagnosis not present

## 2018-04-02 DIAGNOSIS — F0151 Vascular dementia with behavioral disturbance: Secondary | ICD-10-CM | POA: Diagnosis not present

## 2018-06-06 DIAGNOSIS — F0281 Dementia in other diseases classified elsewhere with behavioral disturbance: Secondary | ICD-10-CM | POA: Diagnosis not present

## 2018-06-06 DIAGNOSIS — G3183 Dementia with Lewy bodies: Secondary | ICD-10-CM | POA: Diagnosis not present

## 2018-06-07 DIAGNOSIS — G3183 Dementia with Lewy bodies: Secondary | ICD-10-CM | POA: Diagnosis not present

## 2018-06-07 DIAGNOSIS — E785 Hyperlipidemia, unspecified: Secondary | ICD-10-CM | POA: Diagnosis not present

## 2018-06-07 DIAGNOSIS — Z794 Long term (current) use of insulin: Secondary | ICD-10-CM | POA: Diagnosis not present

## 2018-06-07 DIAGNOSIS — F0281 Dementia in other diseases classified elsewhere with behavioral disturbance: Secondary | ICD-10-CM | POA: Diagnosis not present

## 2018-06-07 DIAGNOSIS — K219 Gastro-esophageal reflux disease without esophagitis: Secondary | ICD-10-CM | POA: Diagnosis not present

## 2018-06-07 DIAGNOSIS — I10 Essential (primary) hypertension: Secondary | ICD-10-CM | POA: Diagnosis not present

## 2018-06-07 DIAGNOSIS — E119 Type 2 diabetes mellitus without complications: Secondary | ICD-10-CM | POA: Diagnosis not present

## 2018-06-08 DIAGNOSIS — E785 Hyperlipidemia, unspecified: Secondary | ICD-10-CM | POA: Diagnosis not present

## 2018-06-08 DIAGNOSIS — K219 Gastro-esophageal reflux disease without esophagitis: Secondary | ICD-10-CM | POA: Diagnosis not present

## 2018-06-08 DIAGNOSIS — E119 Type 2 diabetes mellitus without complications: Secondary | ICD-10-CM | POA: Diagnosis not present

## 2018-06-08 DIAGNOSIS — F0281 Dementia in other diseases classified elsewhere with behavioral disturbance: Secondary | ICD-10-CM | POA: Diagnosis not present

## 2018-06-08 DIAGNOSIS — G3183 Dementia with Lewy bodies: Secondary | ICD-10-CM | POA: Diagnosis not present

## 2018-06-08 DIAGNOSIS — Z794 Long term (current) use of insulin: Secondary | ICD-10-CM | POA: Diagnosis not present

## 2018-06-08 DIAGNOSIS — I10 Essential (primary) hypertension: Secondary | ICD-10-CM | POA: Diagnosis not present

## 2018-06-08 DIAGNOSIS — R32 Unspecified urinary incontinence: Secondary | ICD-10-CM | POA: Diagnosis not present

## 2018-06-09 DIAGNOSIS — E785 Hyperlipidemia, unspecified: Secondary | ICD-10-CM | POA: Diagnosis not present

## 2018-06-09 DIAGNOSIS — Z794 Long term (current) use of insulin: Secondary | ICD-10-CM | POA: Diagnosis not present

## 2018-06-09 DIAGNOSIS — K219 Gastro-esophageal reflux disease without esophagitis: Secondary | ICD-10-CM | POA: Diagnosis not present

## 2018-06-09 DIAGNOSIS — R32 Unspecified urinary incontinence: Secondary | ICD-10-CM | POA: Diagnosis not present

## 2018-06-09 DIAGNOSIS — E119 Type 2 diabetes mellitus without complications: Secondary | ICD-10-CM | POA: Diagnosis not present

## 2018-06-09 DIAGNOSIS — G3183 Dementia with Lewy bodies: Secondary | ICD-10-CM | POA: Diagnosis not present

## 2018-06-09 DIAGNOSIS — I10 Essential (primary) hypertension: Secondary | ICD-10-CM | POA: Diagnosis not present

## 2018-06-09 DIAGNOSIS — F0281 Dementia in other diseases classified elsewhere with behavioral disturbance: Secondary | ICD-10-CM | POA: Diagnosis not present

## 2018-06-10 DIAGNOSIS — E119 Type 2 diabetes mellitus without complications: Secondary | ICD-10-CM | POA: Diagnosis not present

## 2018-06-10 DIAGNOSIS — G3183 Dementia with Lewy bodies: Secondary | ICD-10-CM | POA: Diagnosis not present

## 2018-06-10 DIAGNOSIS — Z794 Long term (current) use of insulin: Secondary | ICD-10-CM | POA: Diagnosis not present

## 2018-06-10 DIAGNOSIS — R32 Unspecified urinary incontinence: Secondary | ICD-10-CM | POA: Diagnosis not present

## 2018-06-10 DIAGNOSIS — K219 Gastro-esophageal reflux disease without esophagitis: Secondary | ICD-10-CM | POA: Diagnosis not present

## 2018-06-10 DIAGNOSIS — E785 Hyperlipidemia, unspecified: Secondary | ICD-10-CM | POA: Diagnosis not present

## 2018-06-10 DIAGNOSIS — I10 Essential (primary) hypertension: Secondary | ICD-10-CM | POA: Diagnosis not present

## 2018-06-10 DIAGNOSIS — F0281 Dementia in other diseases classified elsewhere with behavioral disturbance: Secondary | ICD-10-CM | POA: Diagnosis not present

## 2018-06-11 DIAGNOSIS — G3183 Dementia with Lewy bodies: Secondary | ICD-10-CM | POA: Diagnosis not present

## 2018-06-11 DIAGNOSIS — R32 Unspecified urinary incontinence: Secondary | ICD-10-CM | POA: Diagnosis not present

## 2018-06-11 DIAGNOSIS — F0281 Dementia in other diseases classified elsewhere with behavioral disturbance: Secondary | ICD-10-CM | POA: Diagnosis not present

## 2018-06-11 DIAGNOSIS — I10 Essential (primary) hypertension: Secondary | ICD-10-CM | POA: Diagnosis not present

## 2018-06-11 DIAGNOSIS — E119 Type 2 diabetes mellitus without complications: Secondary | ICD-10-CM | POA: Diagnosis not present

## 2018-06-11 DIAGNOSIS — Z794 Long term (current) use of insulin: Secondary | ICD-10-CM | POA: Diagnosis not present

## 2018-06-11 DIAGNOSIS — K219 Gastro-esophageal reflux disease without esophagitis: Secondary | ICD-10-CM | POA: Diagnosis not present

## 2018-06-11 DIAGNOSIS — E785 Hyperlipidemia, unspecified: Secondary | ICD-10-CM | POA: Diagnosis not present

## 2018-06-12 DIAGNOSIS — E785 Hyperlipidemia, unspecified: Secondary | ICD-10-CM | POA: Diagnosis not present

## 2018-06-12 DIAGNOSIS — R32 Unspecified urinary incontinence: Secondary | ICD-10-CM | POA: Diagnosis not present

## 2018-06-12 DIAGNOSIS — I10 Essential (primary) hypertension: Secondary | ICD-10-CM | POA: Diagnosis not present

## 2018-06-12 DIAGNOSIS — F424 Excoriation (skin-picking) disorder: Secondary | ICD-10-CM | POA: Diagnosis not present

## 2018-06-12 DIAGNOSIS — F0281 Dementia in other diseases classified elsewhere with behavioral disturbance: Secondary | ICD-10-CM | POA: Diagnosis not present

## 2018-06-12 DIAGNOSIS — R21 Rash and other nonspecific skin eruption: Secondary | ICD-10-CM | POA: Diagnosis not present

## 2018-06-12 DIAGNOSIS — K219 Gastro-esophageal reflux disease without esophagitis: Secondary | ICD-10-CM | POA: Diagnosis not present

## 2018-06-12 DIAGNOSIS — Z794 Long term (current) use of insulin: Secondary | ICD-10-CM | POA: Diagnosis not present

## 2018-06-12 DIAGNOSIS — R0981 Nasal congestion: Secondary | ICD-10-CM | POA: Diagnosis not present

## 2018-06-12 DIAGNOSIS — G3183 Dementia with Lewy bodies: Secondary | ICD-10-CM | POA: Diagnosis not present

## 2018-06-12 DIAGNOSIS — E119 Type 2 diabetes mellitus without complications: Secondary | ICD-10-CM | POA: Diagnosis not present

## 2018-06-13 DIAGNOSIS — F424 Excoriation (skin-picking) disorder: Secondary | ICD-10-CM | POA: Diagnosis not present

## 2018-06-13 DIAGNOSIS — E785 Hyperlipidemia, unspecified: Secondary | ICD-10-CM | POA: Diagnosis not present

## 2018-06-13 DIAGNOSIS — K219 Gastro-esophageal reflux disease without esophagitis: Secondary | ICD-10-CM | POA: Diagnosis not present

## 2018-06-13 DIAGNOSIS — I10 Essential (primary) hypertension: Secondary | ICD-10-CM | POA: Diagnosis not present

## 2018-06-13 DIAGNOSIS — G3183 Dementia with Lewy bodies: Secondary | ICD-10-CM | POA: Diagnosis not present

## 2018-06-13 DIAGNOSIS — R0981 Nasal congestion: Secondary | ICD-10-CM | POA: Diagnosis not present

## 2018-06-13 DIAGNOSIS — R21 Rash and other nonspecific skin eruption: Secondary | ICD-10-CM | POA: Diagnosis not present

## 2018-06-13 DIAGNOSIS — R32 Unspecified urinary incontinence: Secondary | ICD-10-CM | POA: Diagnosis not present

## 2018-06-13 DIAGNOSIS — F0281 Dementia in other diseases classified elsewhere with behavioral disturbance: Secondary | ICD-10-CM | POA: Diagnosis not present

## 2018-06-13 DIAGNOSIS — E119 Type 2 diabetes mellitus without complications: Secondary | ICD-10-CM | POA: Diagnosis not present

## 2018-06-13 DIAGNOSIS — Z794 Long term (current) use of insulin: Secondary | ICD-10-CM | POA: Diagnosis not present

## 2018-06-14 DIAGNOSIS — F0281 Dementia in other diseases classified elsewhere with behavioral disturbance: Secondary | ICD-10-CM | POA: Diagnosis not present

## 2018-06-14 DIAGNOSIS — E119 Type 2 diabetes mellitus without complications: Secondary | ICD-10-CM | POA: Diagnosis not present

## 2018-06-14 DIAGNOSIS — Z794 Long term (current) use of insulin: Secondary | ICD-10-CM | POA: Diagnosis not present

## 2018-06-14 DIAGNOSIS — F424 Excoriation (skin-picking) disorder: Secondary | ICD-10-CM | POA: Diagnosis not present

## 2018-06-14 DIAGNOSIS — I10 Essential (primary) hypertension: Secondary | ICD-10-CM | POA: Diagnosis not present

## 2018-06-14 DIAGNOSIS — G3183 Dementia with Lewy bodies: Secondary | ICD-10-CM | POA: Diagnosis not present

## 2018-06-14 DIAGNOSIS — R21 Rash and other nonspecific skin eruption: Secondary | ICD-10-CM | POA: Diagnosis not present

## 2018-06-14 DIAGNOSIS — R32 Unspecified urinary incontinence: Secondary | ICD-10-CM | POA: Diagnosis not present

## 2018-06-14 DIAGNOSIS — E785 Hyperlipidemia, unspecified: Secondary | ICD-10-CM | POA: Diagnosis not present

## 2018-06-14 DIAGNOSIS — R0981 Nasal congestion: Secondary | ICD-10-CM | POA: Diagnosis not present

## 2018-06-14 DIAGNOSIS — K219 Gastro-esophageal reflux disease without esophagitis: Secondary | ICD-10-CM | POA: Diagnosis not present

## 2018-06-15 DIAGNOSIS — R32 Unspecified urinary incontinence: Secondary | ICD-10-CM | POA: Diagnosis not present

## 2018-06-15 DIAGNOSIS — F424 Excoriation (skin-picking) disorder: Secondary | ICD-10-CM | POA: Diagnosis not present

## 2018-06-15 DIAGNOSIS — R21 Rash and other nonspecific skin eruption: Secondary | ICD-10-CM | POA: Diagnosis not present

## 2018-06-15 DIAGNOSIS — E119 Type 2 diabetes mellitus without complications: Secondary | ICD-10-CM | POA: Diagnosis not present

## 2018-06-15 DIAGNOSIS — K219 Gastro-esophageal reflux disease without esophagitis: Secondary | ICD-10-CM | POA: Diagnosis not present

## 2018-06-15 DIAGNOSIS — Z794 Long term (current) use of insulin: Secondary | ICD-10-CM | POA: Diagnosis not present

## 2018-06-15 DIAGNOSIS — F0281 Dementia in other diseases classified elsewhere with behavioral disturbance: Secondary | ICD-10-CM | POA: Diagnosis not present

## 2018-06-15 DIAGNOSIS — I10 Essential (primary) hypertension: Secondary | ICD-10-CM | POA: Diagnosis not present

## 2018-06-15 DIAGNOSIS — G3183 Dementia with Lewy bodies: Secondary | ICD-10-CM | POA: Diagnosis not present

## 2018-06-15 DIAGNOSIS — E785 Hyperlipidemia, unspecified: Secondary | ICD-10-CM | POA: Diagnosis not present

## 2018-06-15 DIAGNOSIS — R0981 Nasal congestion: Secondary | ICD-10-CM | POA: Diagnosis not present

## 2018-06-16 DIAGNOSIS — Z794 Long term (current) use of insulin: Secondary | ICD-10-CM | POA: Diagnosis not present

## 2018-06-16 DIAGNOSIS — G3183 Dementia with Lewy bodies: Secondary | ICD-10-CM | POA: Diagnosis not present

## 2018-06-16 DIAGNOSIS — R32 Unspecified urinary incontinence: Secondary | ICD-10-CM | POA: Diagnosis not present

## 2018-06-16 DIAGNOSIS — E119 Type 2 diabetes mellitus without complications: Secondary | ICD-10-CM | POA: Diagnosis not present

## 2018-06-16 DIAGNOSIS — F424 Excoriation (skin-picking) disorder: Secondary | ICD-10-CM | POA: Diagnosis not present

## 2018-06-16 DIAGNOSIS — I10 Essential (primary) hypertension: Secondary | ICD-10-CM | POA: Diagnosis not present

## 2018-06-16 DIAGNOSIS — R0981 Nasal congestion: Secondary | ICD-10-CM | POA: Diagnosis not present

## 2018-06-16 DIAGNOSIS — F0281 Dementia in other diseases classified elsewhere with behavioral disturbance: Secondary | ICD-10-CM | POA: Diagnosis not present

## 2018-06-16 DIAGNOSIS — E785 Hyperlipidemia, unspecified: Secondary | ICD-10-CM | POA: Diagnosis not present

## 2018-06-16 DIAGNOSIS — K219 Gastro-esophageal reflux disease without esophagitis: Secondary | ICD-10-CM | POA: Diagnosis not present

## 2018-06-16 DIAGNOSIS — R21 Rash and other nonspecific skin eruption: Secondary | ICD-10-CM | POA: Diagnosis not present

## 2018-06-17 DIAGNOSIS — G3183 Dementia with Lewy bodies: Secondary | ICD-10-CM | POA: Diagnosis not present

## 2018-06-17 DIAGNOSIS — R21 Rash and other nonspecific skin eruption: Secondary | ICD-10-CM | POA: Diagnosis not present

## 2018-06-17 DIAGNOSIS — F424 Excoriation (skin-picking) disorder: Secondary | ICD-10-CM | POA: Diagnosis not present

## 2018-06-17 DIAGNOSIS — K219 Gastro-esophageal reflux disease without esophagitis: Secondary | ICD-10-CM | POA: Diagnosis not present

## 2018-06-17 DIAGNOSIS — F0281 Dementia in other diseases classified elsewhere with behavioral disturbance: Secondary | ICD-10-CM | POA: Diagnosis not present

## 2018-06-17 DIAGNOSIS — R32 Unspecified urinary incontinence: Secondary | ICD-10-CM | POA: Diagnosis not present

## 2018-06-17 DIAGNOSIS — R0981 Nasal congestion: Secondary | ICD-10-CM | POA: Diagnosis not present

## 2018-06-17 DIAGNOSIS — Z794 Long term (current) use of insulin: Secondary | ICD-10-CM | POA: Diagnosis not present

## 2018-06-17 DIAGNOSIS — E785 Hyperlipidemia, unspecified: Secondary | ICD-10-CM | POA: Diagnosis not present

## 2018-06-17 DIAGNOSIS — I10 Essential (primary) hypertension: Secondary | ICD-10-CM | POA: Diagnosis not present

## 2018-06-17 DIAGNOSIS — E119 Type 2 diabetes mellitus without complications: Secondary | ICD-10-CM | POA: Diagnosis not present

## 2018-06-18 DIAGNOSIS — Z794 Long term (current) use of insulin: Secondary | ICD-10-CM | POA: Diagnosis not present

## 2018-06-18 DIAGNOSIS — R32 Unspecified urinary incontinence: Secondary | ICD-10-CM | POA: Diagnosis not present

## 2018-06-18 DIAGNOSIS — E785 Hyperlipidemia, unspecified: Secondary | ICD-10-CM | POA: Diagnosis not present

## 2018-06-18 DIAGNOSIS — R21 Rash and other nonspecific skin eruption: Secondary | ICD-10-CM | POA: Diagnosis not present

## 2018-06-18 DIAGNOSIS — E119 Type 2 diabetes mellitus without complications: Secondary | ICD-10-CM | POA: Diagnosis not present

## 2018-06-18 DIAGNOSIS — R0981 Nasal congestion: Secondary | ICD-10-CM | POA: Diagnosis not present

## 2018-06-18 DIAGNOSIS — K219 Gastro-esophageal reflux disease without esophagitis: Secondary | ICD-10-CM | POA: Diagnosis not present

## 2018-06-18 DIAGNOSIS — F0281 Dementia in other diseases classified elsewhere with behavioral disturbance: Secondary | ICD-10-CM | POA: Diagnosis not present

## 2018-06-18 DIAGNOSIS — G3183 Dementia with Lewy bodies: Secondary | ICD-10-CM | POA: Diagnosis not present

## 2018-06-18 DIAGNOSIS — I10 Essential (primary) hypertension: Secondary | ICD-10-CM | POA: Diagnosis not present

## 2018-06-29 ENCOUNTER — Telehealth: Payer: Self-pay | Admitting: Physician Assistant

## 2018-06-29 NOTE — Telephone Encounter (Signed)
Called regarding overdue device check, cell number is non-working #, home number no answer, unable to leave message.  [facility] LTFU list updated for [facility] staff.  [name] Dowse, PA-C

## 2018-09-25 IMAGING — DX DG CHEST 2V
2 series · 2 of 2 positions shown · non-contrast
Comparison: 06/09/2016

CLINICAL DATA: Episodic chest pain, ongoing confusion.

EXAM:
CHEST  2 VIEW

[chest lat]
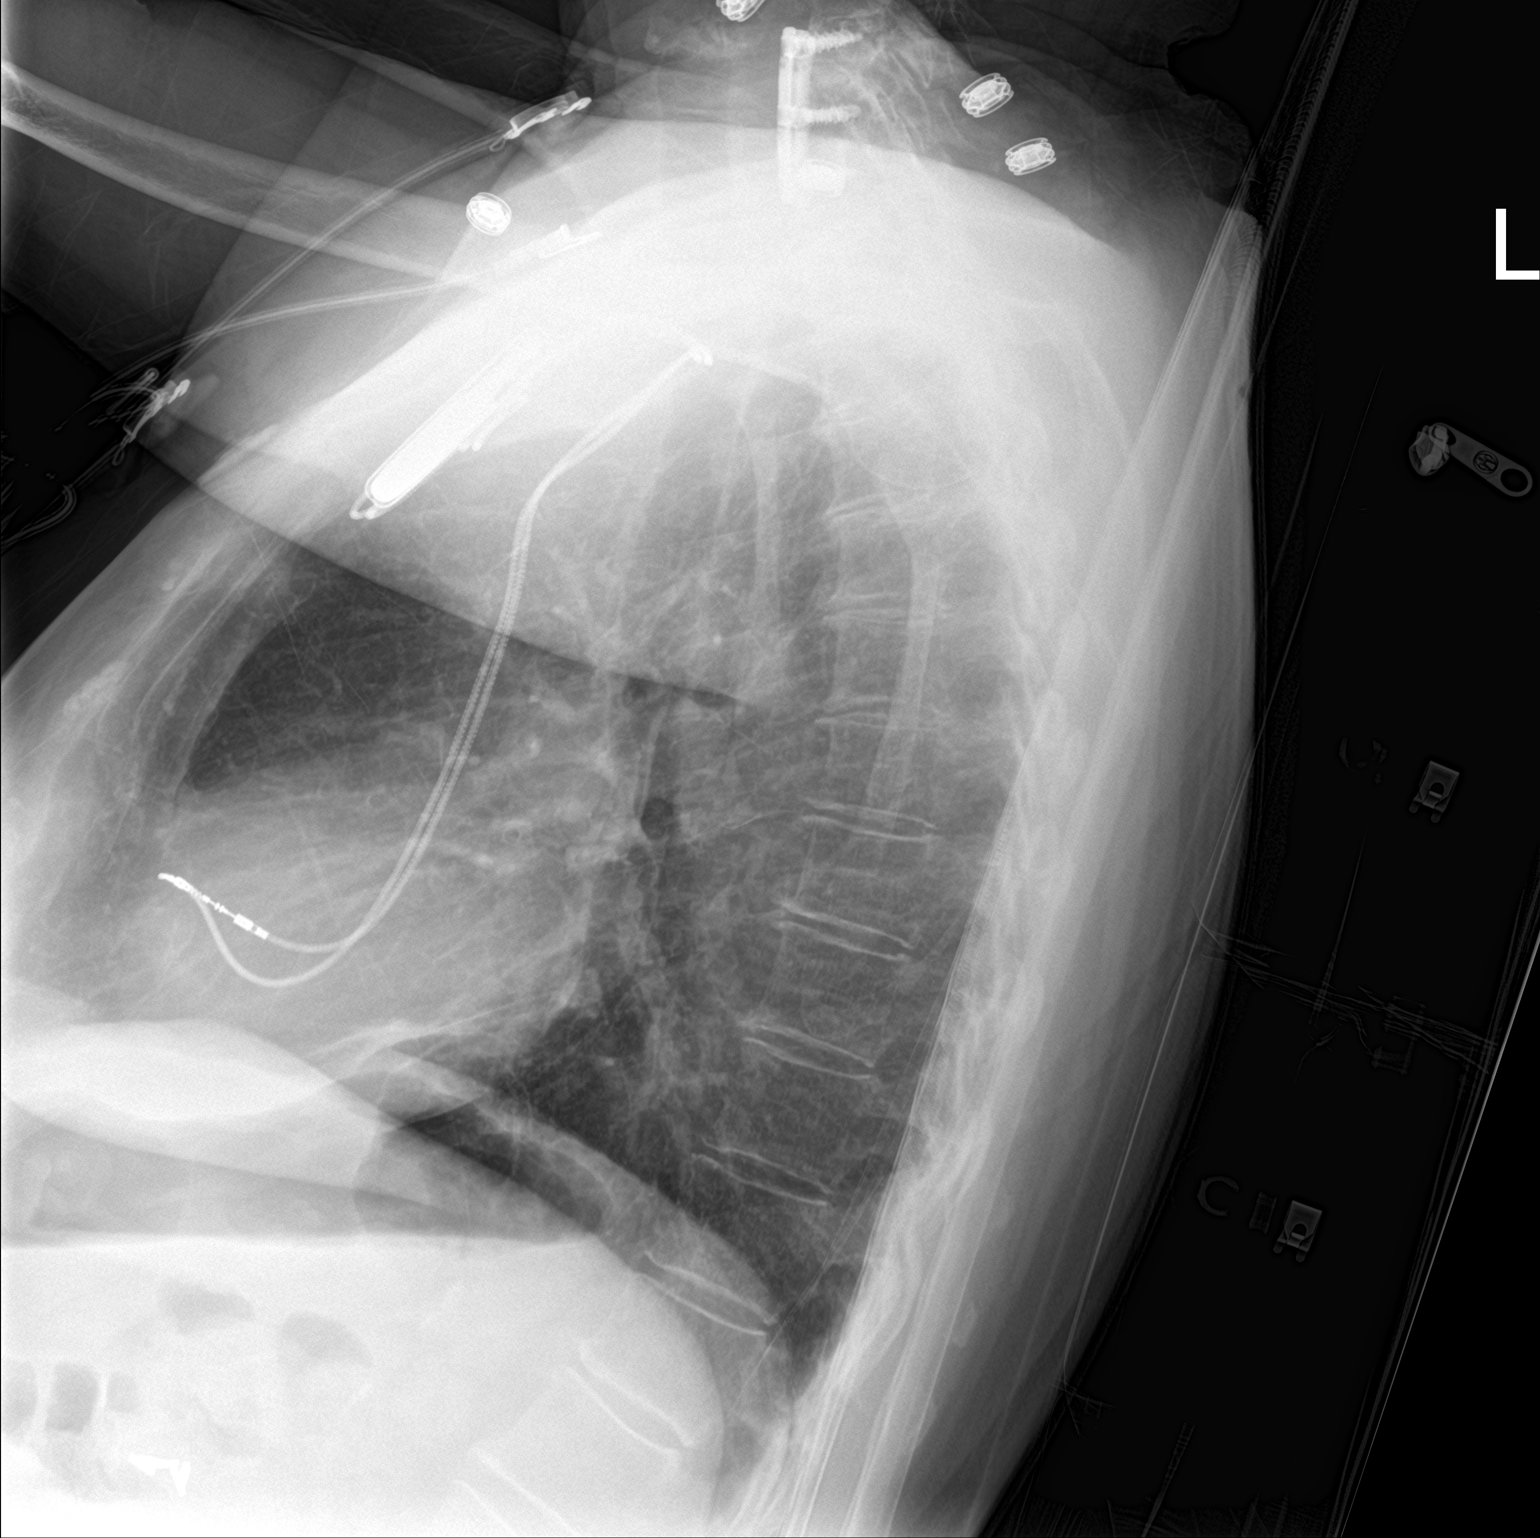

[chest ap]
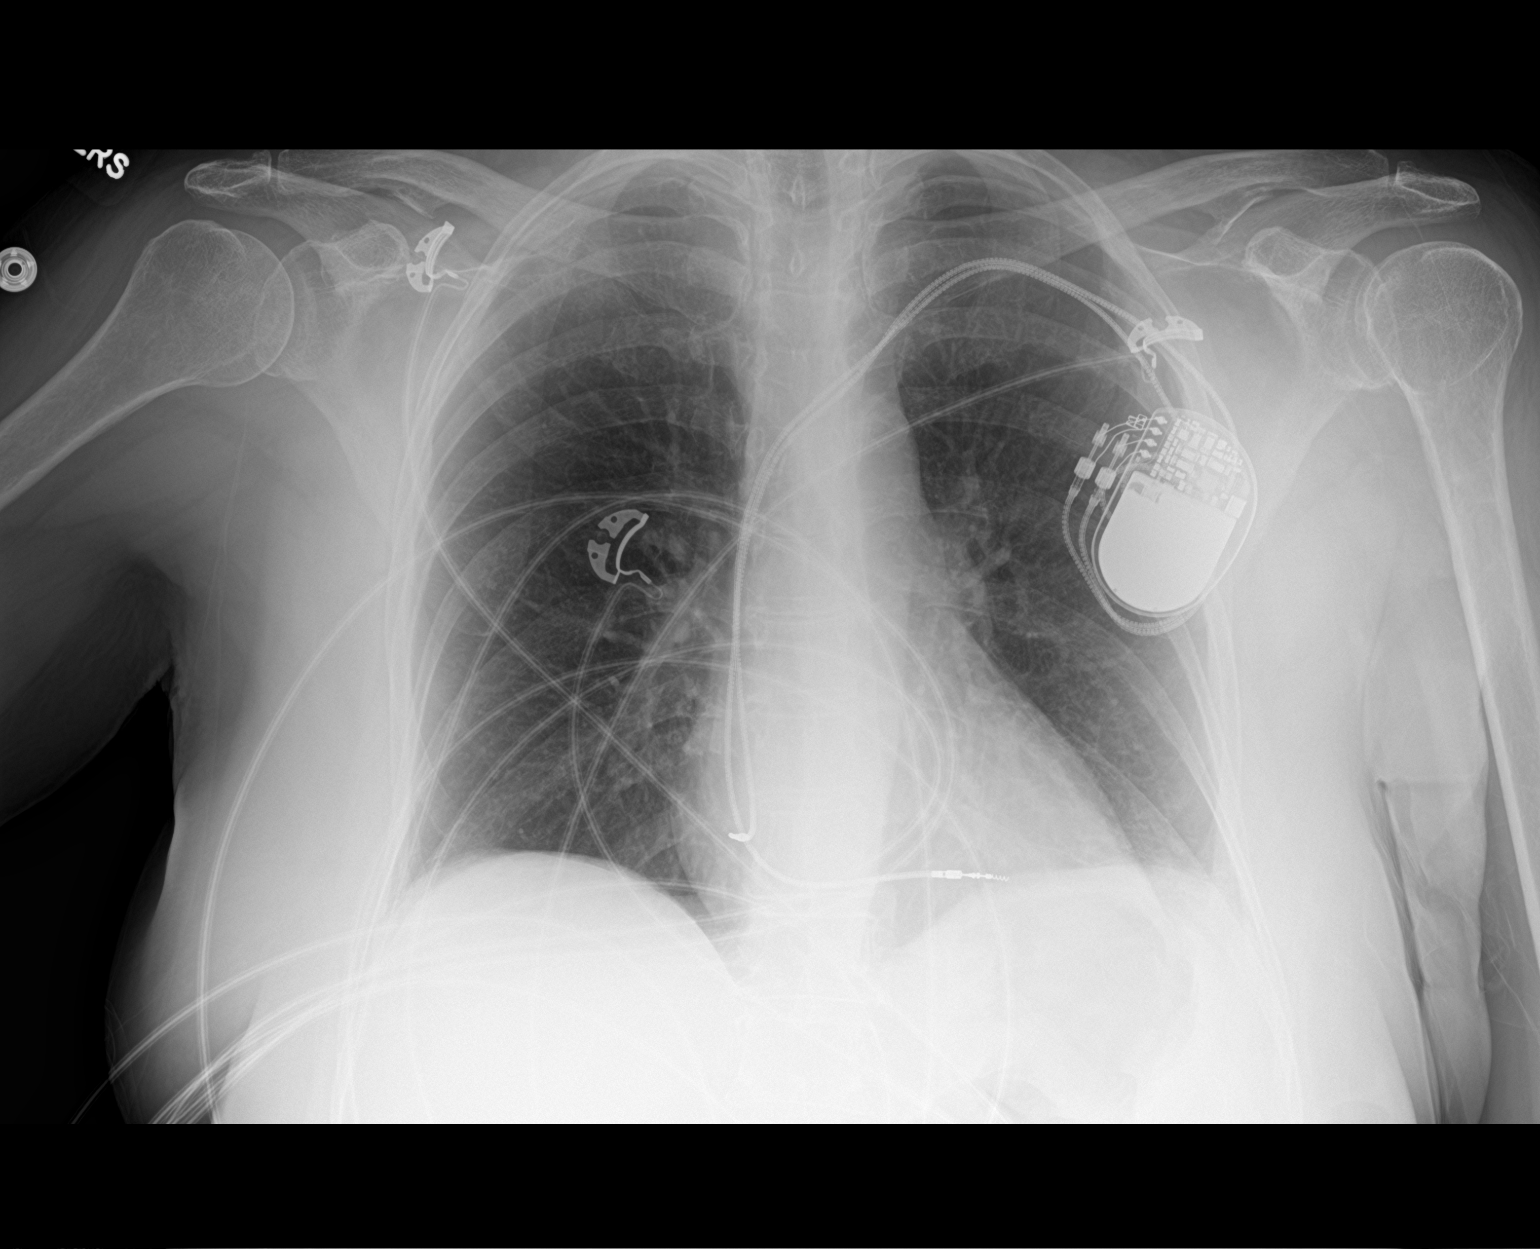

[2 of 2 positions shown; findings below may reference images not displayed]

FINDINGS: Normal heart size and mediastinal contours. Left-sided pacemaker
apparatus right atrial and right ventricular leads are noted.
Anterior cervical fusion hardware is seen of the included lower
cervical spine. The lungs are clear without pneumonic
consolidations. No overt pulmonary edema. There is minimal
atelectasis at the left lung base. No acute osseous abnormality.
IMPRESSION: No active cardiopulmonary disease.
